# Patient Record
Sex: Female | Born: 1960 | Race: Black or African American | Hispanic: No | Marital: Married | Smoking: Never smoker
Health system: Southern US, Community
[De-identification: ages and names within clinical notes are randomized; demographics above are authoritative.]

## PROBLEM LIST (undated history)

## (undated) DIAGNOSIS — L509 Urticaria, unspecified: Secondary | ICD-10-CM

## (undated) DIAGNOSIS — R51 Headache: Secondary | ICD-10-CM

## (undated) DIAGNOSIS — J45909 Unspecified asthma, uncomplicated: Secondary | ICD-10-CM

## (undated) DIAGNOSIS — M1990 Unspecified osteoarthritis, unspecified site: Secondary | ICD-10-CM

## (undated) DIAGNOSIS — E119 Type 2 diabetes mellitus without complications: Secondary | ICD-10-CM

## (undated) DIAGNOSIS — D252 Subserosal leiomyoma of uterus: Secondary | ICD-10-CM

## (undated) DIAGNOSIS — R87619 Unspecified abnormal cytological findings in specimens from cervix uteri: Secondary | ICD-10-CM

## (undated) DIAGNOSIS — J309 Allergic rhinitis, unspecified: Secondary | ICD-10-CM

## (undated) DIAGNOSIS — I10 Essential (primary) hypertension: Secondary | ICD-10-CM

## (undated) DIAGNOSIS — K219 Gastro-esophageal reflux disease without esophagitis: Secondary | ICD-10-CM

## (undated) HISTORY — DX: Unspecified osteoarthritis, unspecified site: M19.90

## (undated) HISTORY — PX: TUBAL LIGATION: SHX77

## (undated) HISTORY — DX: Subserosal leiomyoma of uterus: D25.2

## (undated) HISTORY — DX: Unspecified abnormal cytological findings in specimens from cervix uteri: R87.619

## (undated) HISTORY — PX: COLONOSCOPY: SHX174

## (undated) HISTORY — DX: Urticaria, unspecified: L50.9

## (undated) HISTORY — DX: Essential (primary) hypertension: I10

## (undated) HISTORY — DX: Type 2 diabetes mellitus without complications: E11.9

## (undated) HISTORY — PX: BUNIONECTOMY: SHX129

## (undated) HISTORY — DX: Allergic rhinitis, unspecified: J30.9

---

## 1998-10-05 ENCOUNTER — Other Ambulatory Visit: Admission: RE | Admit: 1998-10-05 | Discharge: 1998-10-05 | Payer: Self-pay | Admitting: Obstetrics and Gynecology

## 2001-02-11 ENCOUNTER — Other Ambulatory Visit: Admission: RE | Admit: 2001-02-11 | Discharge: 2001-02-11 | Payer: Self-pay | Admitting: Obstetrics and Gynecology

## 2002-03-02 ENCOUNTER — Other Ambulatory Visit: Admission: RE | Admit: 2002-03-02 | Discharge: 2002-03-02 | Payer: Self-pay | Admitting: Obstetrics and Gynecology

## 2002-10-01 ENCOUNTER — Encounter: Payer: Self-pay | Admitting: Emergency Medicine

## 2002-10-01 ENCOUNTER — Emergency Department (HOSPITAL_COMMUNITY): Admission: EM | Admit: 2002-10-01 | Discharge: 2002-10-01 | Payer: Self-pay | Admitting: Emergency Medicine

## 2004-05-31 ENCOUNTER — Emergency Department (HOSPITAL_COMMUNITY): Admission: EM | Admit: 2004-05-31 | Discharge: 2004-05-31 | Payer: Self-pay

## 2004-09-19 ENCOUNTER — Other Ambulatory Visit: Admission: RE | Admit: 2004-09-19 | Discharge: 2004-09-19 | Payer: Self-pay | Admitting: Obstetrics and Gynecology

## 2006-03-07 ENCOUNTER — Encounter: Admission: RE | Admit: 2006-03-07 | Discharge: 2006-03-07 | Payer: Self-pay | Admitting: Family Medicine

## 2006-03-25 ENCOUNTER — Encounter: Admission: RE | Admit: 2006-03-25 | Discharge: 2006-06-23 | Payer: Self-pay | Admitting: Neurosurgery

## 2007-03-31 ENCOUNTER — Emergency Department (HOSPITAL_COMMUNITY): Admission: EM | Admit: 2007-03-31 | Discharge: 2007-04-01 | Payer: Self-pay | Admitting: Emergency Medicine

## 2007-06-07 ENCOUNTER — Encounter: Admission: RE | Admit: 2007-06-07 | Discharge: 2007-06-07 | Payer: Self-pay | Admitting: Gastroenterology

## 2007-10-05 ENCOUNTER — Ambulatory Visit: Payer: Self-pay | Admitting: Internal Medicine

## 2007-10-05 DIAGNOSIS — J3089 Other allergic rhinitis: Secondary | ICD-10-CM

## 2007-10-05 DIAGNOSIS — J302 Other seasonal allergic rhinitis: Secondary | ICD-10-CM

## 2007-10-05 DIAGNOSIS — J4520 Mild intermittent asthma, uncomplicated: Secondary | ICD-10-CM

## 2007-10-05 LAB — CONVERTED CEMR LAB
A-1 Antitrypsin, Ser: 171 mg/dL (ref 83–200)
IgE (Immunoglobulin E), Serum: 259.4 intl units/mL — ABNORMAL HIGH (ref 0.0–180.0)

## 2007-10-06 ENCOUNTER — Telehealth: Payer: Self-pay | Admitting: Internal Medicine

## 2007-11-10 ENCOUNTER — Telehealth: Payer: Self-pay | Admitting: Internal Medicine

## 2008-09-11 ENCOUNTER — Encounter: Admission: RE | Admit: 2008-09-11 | Discharge: 2008-09-11 | Payer: Self-pay | Admitting: Neurological Surgery

## 2009-03-12 ENCOUNTER — Emergency Department (HOSPITAL_COMMUNITY): Admission: EM | Admit: 2009-03-12 | Discharge: 2009-03-12 | Payer: Self-pay | Admitting: Emergency Medicine

## 2009-06-07 ENCOUNTER — Telehealth: Payer: Self-pay | Admitting: Internal Medicine

## 2009-07-27 ENCOUNTER — Ambulatory Visit: Payer: Self-pay | Admitting: Internal Medicine

## 2009-08-23 ENCOUNTER — Telehealth: Payer: Self-pay | Admitting: Internal Medicine

## 2009-08-23 ENCOUNTER — Ambulatory Visit: Payer: Self-pay | Admitting: Internal Medicine

## 2009-09-03 ENCOUNTER — Telehealth (INDEPENDENT_AMBULATORY_CARE_PROVIDER_SITE_OTHER): Payer: Self-pay | Admitting: *Deleted

## 2009-10-05 ENCOUNTER — Ambulatory Visit: Payer: Self-pay | Admitting: Internal Medicine

## 2009-10-05 DIAGNOSIS — R599 Enlarged lymph nodes, unspecified: Secondary | ICD-10-CM | POA: Insufficient documentation

## 2009-11-12 ENCOUNTER — Telehealth (INDEPENDENT_AMBULATORY_CARE_PROVIDER_SITE_OTHER): Payer: Self-pay | Admitting: *Deleted

## 2009-11-15 ENCOUNTER — Emergency Department (HOSPITAL_COMMUNITY): Admission: EM | Admit: 2009-11-15 | Discharge: 2009-11-15 | Payer: Self-pay | Admitting: Family Medicine

## 2009-12-03 ENCOUNTER — Telehealth (INDEPENDENT_AMBULATORY_CARE_PROVIDER_SITE_OTHER): Payer: Self-pay | Admitting: *Deleted

## 2009-12-05 ENCOUNTER — Telehealth: Payer: Self-pay | Admitting: Internal Medicine

## 2009-12-11 ENCOUNTER — Encounter: Payer: Self-pay | Admitting: Internal Medicine

## 2010-01-01 ENCOUNTER — Telehealth (INDEPENDENT_AMBULATORY_CARE_PROVIDER_SITE_OTHER): Payer: Self-pay | Admitting: *Deleted

## 2010-01-08 ENCOUNTER — Ambulatory Visit: Payer: Self-pay | Admitting: Internal Medicine

## 2010-01-08 DIAGNOSIS — B370 Candidal stomatitis: Secondary | ICD-10-CM

## 2010-07-30 NOTE — Miscellaneous (Signed)
Summary: Orders Update pft charges  Clinical Lists Changes  Orders: Added new Service order of Carbon Monoxide diffusing w/capacity (94720) - Signed Added new Service order of Lung Volumes (94240) - Signed Added new Service order of Spirometry (Pre & Post) (94060) - Signed 

## 2010-07-30 NOTE — Assessment & Plan Note (Signed)
Summary: full pft follow up/klw   Visit Type:  Follow-up Copy to:  Victoria [name] Primary Provider/Referring Provider:  Dr. Reather [name]  CC:  Pt here for follow-up to discuss PFT results. Pt states breahting is the same. Marland Kitchen  History of Present Illness: OV 07/27/2009. Followup Moderate Persistent Asthma (allergic asthma). I  initially saw Victoria [name] as a new difficultl asthma eval in April 2009. The striking thing at that time was that despite medications her FEv1s were always in 50-60% range. She was then lost to followup. SHe says she left Dr Victoria [name] care and has been under care of DR Victoria [name]. Dr Victoria [name] started her on xolair around Sept 2010 per her report. Despite xolair she says that subjextively she is the same which is "stable" and needing albuterol 2 times per week, no nocturenal symptoms but suffering from dyspnea with exertion to mailbox that is relieved by rest and inhalers. Objectively she states that spirometries at Victoria [name] office have not shown any improvement and is "Bad". I do not have those numbers with me for review. She is back here wondering how else we can optimize treatment.  Of note, at last visit in 2009 I had checked A1AT levels. this was normal 174 and IgE. The IGE was high in 250s. Also, clinically I did not think she had ABPA  REC:  change symbicort to high dose dulera return with pft's  OV 2/24/.2011: Followup Moderate Persistent Asthma (allergic asthma). Since starting dulera last month, she has noticed no difference in symptoms. STates she has exertional chest tighness that persists. She notices that with exertion like working out in elliptical. That is when she used albuterol last; 2 weeks ago. Last 2 weeks no dyspnea, no wheeze, no cough, no nocturnal awakeninings. She does not think dulear has helped. PFTs today show Fev1 1.62L/61% with DLCO 15.6/63% (note: in 2009 outside CT chest thought benign and a1at level normal). THis is a 300cc improvement in fev1 since starting  dulera.   Current Medications (verified): 1)  Singulair 10 Mg  Tabs (Montelukast Sodium) .... Once Daily 2)  Dulera 100-5 Mcg/act Aero (Mometasone Furo-Formoterol Fum) .... 2 Puffs Twice Daily 3)  Fluticasone Propionate 50 Mcg/act Susp (Fluticasone Propionate) .... 2 Puffs Once Daily 4)  C-250 .... Take 1 Tablet By Mouth Once A Day 5)  Multivitamins  Tabs (Multiple Vitamin) .... Take 1 Tablet By Mouth Once A Day 6)  Magnesium Oxide 420 Mg Tabs (Magnesium Oxide) .... As Needed 7)  Xolair 150 Mg Solr (Omalizumab) .... Every 2 Weeks, 2 Injections  Allergies (verified): 1)  ! Sulfa  Past History:  Family History: Last updated: 10/05/2007 emphysema--father asthma - son  Social History: Last updated: 10/05/2007 have children married lives with husband imaging analyst Patient has never smoked.   Risk Factors: Alcohol Use: <1 (10/05/2007)  Risk Factors: Smoking Status: never (10/05/2007)  Past Medical History: #Allergic Rhinitis  #Asthma x diagnosed at age 4. Denies hospitalization or ICU admits or mechanical admits. Total ER visits = 15. Last ER visit 03/2007 for "drug allergy" and 2001 for asthma attack due to shell fish. Typical symptoms are dyspnea, wheezing, lower back pain, cough. Typical triggers are sea food, shellfish, sulfa, dust, pollen, cigarettes, cold air etc.,  Was on inahled steroid flovent 2002 - > 2008. On advair for 1 year 2008. On singular since 2003 or so. On allergy shots once a week x since 03/2007.  #Positive Allergy Testing x 03/2007 - dust mite, ragweed, shellfish, "most trees", cat, dog,  cockroach, (reviewed and noted from outside records of Victoria [name]) #GERD x many years esp with spicy food. Symptomatic 1-2 times a week. Was on prilosec. Currently not on PPI  Past Pulmonary History:  Pulmonary History: OFFICE spiro at Dr. Blossom [name] office  09/30/2007: Fev1 - 1.7L/69%, ratio 61 (82) 09/01/2007: fev1- 1.69/68% 08/11/2007: fev1-  1.49/60% 08/09/2007: fev1  - 1.228/49% (worst) - recd prednisone per hx 06/29/2007: fev1 - 1.61/64% 11/19/208: fev1 - 1.95/78% (best) 04/13/2007 - fev1 - 1.274/51% (1st visit with Dr. Corinda [name], rec pred)  ..... Victoria [name] here 07/27/2009 - fev1 1.34L/53.7% -> change symbicort to high dose dulera 08/23/2009 - fev1 1.62L/61% (300cc improvoement)  Family History: Reviewed history from 10/05/2007 and no changes required. emphysema--father asthma - son  Social History: Reviewed history from 10/05/2007 and no changes required. have children married lives with husband imaging analyst Patient has never smoked.   Review of Systems  The patient denies shortness of breath with activity, shortness of breath at rest, productive cough, non-productive cough, coughing up blood, chest pain, irregular heartbeats, acid heartburn, indigestion, loss of appetite, weight change, abdominal pain, difficulty swallowing, sore throat, tooth/dental problems, headaches, nasal congestion/difficulty breathing through nose, sneezing, itching, ear ache, anxiety, depression, hand/feet swelling, joint stiffness or pain, rash, change in color of mucus, and fever.    Vital Signs:  Patient profile:   50 year old female Height:      65 inches Weight:      185 pounds O2 Sat:      99 % on Room air Pulse rate:   68 / minute BP sitting:   130 / 86  (right arm) Cuff size:   regular  Vitals Entered By: Victoria [name] CMA (August 23, 2009 1:50 PM)  O2 Flow:  Room air CC: Pt here for follow-up to discuss PFT results. Pt states breahting is the same.  Comments Medications reviewed with patient Victoria [name] CMA  August 23, 2009 1:52 PM Daytime phone number verified with patient.    Physical Exam  General:  well developed, well nourished, in no acute distress Head:  normocephalic and atraumatic Eyes:  PERRLA/EOM intact; conjunctiva and sclera clear Ears:  TMs intact and clear with normal canals Nose:  no deformity, discharge,  inflammation, or lesions Mouth:  no deformity or lesions Neck:  no masses, thyromegaly, or abnormal cervical nodes Chest Wall:  no deformities noted Lungs:  clear bilaterally to auscultation and percussion Heart:  regular rate and rhythm, S1, S2 without murmurs, rubs, gallops, or clicks Abdomen:  bowel sounds positive; abdomen soft and non-tender without masses, or organomegaly Msk:  no deformity or scoliosis noted with normal posture Pulses:  pulses normal Extremities:  no clubbing, cyanosis, edema, or deformity noted Neurologic:  CN II-XII grossly intact with normal reflexes, coordination, muscle strength and tone Skin:  intact without lesions or rashes Cervical Nodes:  no significant adenopathy Psych:  alert and cooperative; normal mood and affect; normal attention span and concentration   Impression & Recommendations:  Problem # 1:  ASTHMA, PERSISTENT, MODERATE (ICD-493.90) Assessment Improved subjectively remains in  well controlled category with fixed exertional dyspna. . Objectively improved by 300cc fev1 after starting dulera but feels no better. Suspect she is remodelled. Will start spiriva (new NEJM study) and see if this helps. She really wants to see improvement in fev1. return in 6 weeks. If spiriva does not work, can add ciclesonide to target small airways. If that too does not work, then ? thermoplasty eval.   NOTE: DLCO  63% but she never smoked and outside CT showed no fibrosis or copd. Will try to review this again at next visit and also check her hgb at next visit  Medications Added to Medication List This Visit: 1)  Dulera 100-5 Mcg/act Aero (Mometasone furo-formoterol fum) .... 2 puffs twice daily 2)  Spiriva Handihaler 18 Mcg Caps (Tiotropium bromide monohydrate) .... One capsule - take 2 puffs in handihaler daily in morning  Other Orders: HFA Instruction [phone]) Est. Patient Level III (60454)  Patient Instructions: 1)  continue singulair 2)  continue xolair 3)   cointinue high dose dulera 2puff two times a day (take 2 samples) 4)  start spiriva 1 puff  one time a day (take 2 samples, learn tech) 5)  continue albuterol 2 puff as needed 6)  return to see me in 6 weeks  7)  do spirometry at return Prescriptions: SPIRIVA HANDIHALER 18 MCG  CAPS (TIOTROPIUM BROMIDE MONOHYDRATE) one capsule - take 2 puffs in handihaler daily in morning  #1 x 6   Entered and Authorized by:   Kalman Shan MD   Signed by:   Kalman Shan MD on 08/23/2009   Method used:   Electronically to        [facility] [id number] (retail)       [address]       [place], [place]  [address]       Ph: [phone]       Fax: [phone]   RxID:   [id number]

## 2010-07-30 NOTE — Assessment & Plan Note (Signed)
Summary: rov/ mbw   Visit Type:  Follow-up Copy to:  Stevphen Rochester Primary Provider/Referring Provider:  Dr. Reather Converse  CC:  Pt here for follow-up. Pt states allergist told her to schedule an appt because she felt her breahting was not as good as it should be. Pt has no complaints. .  History of Present Illness: OV 07/27/2009. Followup Asthma (allergic asthma). I  initially saw Victoria [name] as a new difficultl asthma eval in April 2009. The striking thing at that time was that despite medications her FEv1s were always in 50-60% range. She was then lost to followup. SHe says she left Dr Blossom Hoops care and has been under care of DR Willa Rough. Dr Willa Rough started her on xolair around Sept 2010 per her report. Despite xolair she says that subjextively she is the same which is "stable" and needing albuterol 2 times per week, no nocturenal symptoms but suffering from dyspnea with exertion to mailbox that is relieved by rest and inhalers. Objectively she states that spirometries at DR. Hicks office have not shown any improvement and is "Bad". I do not have those numbers with me for review. She is back here wondering how else we can optimize treatment.  Of note, at last visit I had checked A1AT levels. this was normal 174 and IgE. The IGE was high in 250s. Also, clinically I did not think she had ABPA  [facility] Spirometry  ID: [id number] Patient: Victoria [name], Victoria [name] DOB: 02-23-1961 Age: 50 Years Old Sex: Female Race: Black Height: 65 Weight: 186 Status: Unconfirmed Past Medical History:  Allergic Rhinitis  Asthma x diagnosed at age 35. Denies hospitalization or ICU admits or mechanical admits. Total ER visits = 15. Last ER visit 03/2007 for "drug allergy" and 2001 for asthma attack due to shell fish. Typical symptoms are dyspnea, wheezing, lower back pain, cough. Typical triggers are sea food, shellfish, sulfa, dust, pollen, cigarettes, cold air etc.,  Was on inahled steroid flovent 2002 - > 2008. On advair  for 1 year 2008. On singular since 2003 or so. On allergy shots once a week x since 03/2007.  Positive Allergy Testing x 03/2007 - dust mite, ragweed, shellfish, "most trees", cat, dog, cockroach, (reviewed and noted from outside records of Dr. Delano Metz) GERD x many years esp with spicy food. Symptomatic 1-2 times a week. Was on prilosec. Currently not on PPI Allergy Skin testing - 03/2007 - positive for roaches, dust, shell fish  Recorded: 07/27/2009 12:14 AM  Parameter  Measured Predicted %Predicted FVC     2.12        3.09        68.80 FEV1     1.34        2.50        53.70 FEV1%   63.22        82.28        76.80 PEF    2.65        6.53        40.60   Interpretation: moderate - severe obstruction, Personally reviewed trace  Current Medications (verified): 1)  Singulair 10 Mg  Tabs (Montelukast Sodium) .... Once Daily 2)  Symbicort 160-4.5 Mcg/act  Aero (Budesonide-Formoterol Fumarate) .... Two Times A Day 3)  Fluticasone Propionate 50 Mcg/act Susp (Fluticasone Propionate) .... 2 Puffs Once Daily 4)  C-250 .... Take 1 Tablet By Mouth Once A Day 5)  Multivitamins  Tabs (Multiple Vitamin) .... Take 1 Tablet By Mouth Once A Day 6)  Magnesium Oxide  420 Mg Tabs (Magnesium Oxide) .... As Needed 7)  Xolair 150 Mg Solr (Omalizumab) .... Every 2 Weeks, 2 Injections  Allergies (verified): 1)  ! Sulfa  Past History:  Past Medical History: Last updated: 10/05/2007 Allergic Rhinitis  Asthma x diagnosed at age 65. Denies hospitalization or ICU admits or mechanical admits. Total ER visits = 15. Last ER visit 03/2007 for "drug allergy" and 2001 for asthma attack due to shell fish. Typical symptoms are dyspnea, wheezing, lower back pain, cough. Typical triggers are sea food, shellfish, sulfa, dust, pollen, cigarettes, cold air etc.,  Was on inahled steroid flovent 2002 - > 2008. On advair for 1 year 2008. On singular since 2003 or so. On allergy shots once a week x since 03/2007.  Positive  Allergy Testing x 03/2007 - dust mite, ragweed, shellfish, "most trees", cat, dog, cockroach, (reviewed and noted from outside records of Dr. Delano Metz) GERD x many years esp with spicy food. Symptomatic 1-2 times a week. Was on prilosec. Currently not on PPI Allergy Skin testing - 03/2007 - positive for roaches, dust, shell fish  Family History: Last updated: 10/05/2007 emphysema--father asthma - son  Social History: Last updated: 10/05/2007 have children married lives with husband imaging analyst Patient has never smoked.   Risk Factors: Alcohol Use: <1 (10/05/2007)  Risk Factors: Smoking Status: never (10/05/2007)  Family History: Reviewed history from 10/05/2007 and no changes required. emphysema--father asthma - son  Social History: Reviewed history from 10/05/2007 and no changes required. have children married lives with husband imaging analyst Patient has never smoked.   Review of Systems  The patient denies shortness of breath with activity, shortness of breath at rest, productive cough, non-productive cough, coughing up blood, chest pain, irregular heartbeats, acid heartburn, indigestion, loss of appetite, weight change, abdominal pain, difficulty swallowing, sore throat, tooth/dental problems, headaches, nasal congestion/difficulty breathing through nose, sneezing, itching, ear ache, anxiety, depression, hand/feet swelling, joint stiffness or pain, rash, change in color of mucus, and fever.    Vital Signs:  Patient profile:   50 year old female Height:      65 inches Weight:      186 pounds BMI:     31.06 O2 Sat:      98 % on Room air Temp:     98.0 degrees F Pulse rate:   52 / minute BP sitting:   134 / 80  (right arm) Cuff size:   regular  Vitals Entered By: Carron Curie CMA (July 27, 2009 11:40 AM)  O2 Flow:  Room air CC: Pt here for follow-up. Pt states allergist told her to schedule an appt because she felt her breahting was not as good as  it should be. Pt has no complaints.  Comments Medications reviewed with patient Daytime phone number verified with patient. Carron Curie CMA  July 27, 2009 11:40 AM    Physical Exam  General:  well developed, well nourished, in no acute distress Head:  normocephalic and atraumatic Eyes:  PERRLA/EOM intact; conjunctiva and sclera clear Ears:  TMs intact and clear with normal canals Nose:  no deformity, discharge, inflammation, or lesions Mouth:  no deformity or lesions Neck:  no masses, thyromegaly, or abnormal cervical nodes Chest Wall:  no deformities noted Lungs:  clear bilaterally to auscultation and percussion Heart:  regular rate and rhythm, S1, S2 without murmurs, rubs, gallops, or clicks Abdomen:  bowel sounds positive; abdomen soft and non-tender without masses, or organomegaly Msk:  no deformity or scoliosis noted with  normal posture Pulses:  pulses normal Extremities:  no clubbing, cyanosis, edema, or deformity noted Neurologic:  CN II-XII grossly intact with normal reflexes, coordination, muscle strength and tone Skin:  intact without lesions or rashes Cervical Nodes:  no significant adenopathy Psych:  alert and cooperative; normal mood and affect; normal attention span and concentration   Pulmonary Function Test Date: 07/27/2009 12:14 AM Gender: Female  Pre-Spirometry FVC    Value: 2.12 L/min   % Pred: 68.80 % FEV1    Value: 1.34 L     Pred: 2.50 L     % Pred: 53.70 % FEV1/FVC  Value: 63.22 %     % Pred: 76.80 %  Evaluation: moderate obstruction with NO significant bronchodilator response  Impression & Recommendations:  Problem # 1:  ASTHMA, PERSISTENT, MODERATE (ICD-493.90) Assessment Unchanged Subjectively she appears poorly to well controlled NIH class but objectively she in the moderate-severe persistent class despite symbicort, singulair and xolair. I am wondering if she has remodelled asthma. Nevertheless, will siwtch her symbicort which is at  medium dose steroid to high dose DULERA (samples given) and see if this helps. IF this does not, I will try highes dose advair.  She will return to see me in 4 weeks. She is agreeable with plan  Medications Added to Medication List This Visit: 1)  Fluticasone Propionate 50 Mcg/act Susp (Fluticasone propionate) .... 2 puffs once daily 2)  C-250  .... Take 1 tablet by mouth once a day 3)  Multivitamins Tabs (Multiple vitamin) .... Take 1 tablet by mouth once a day 4)  Magnesium Oxide 420 Mg Tabs (Magnesium oxide) .... As needed 5)  Xolair 150 Mg Solr (Omalizumab) .... Every 2 weeks, 2 injections  Other Orders: Pulmonary Referral (Pulmonary) Est. Patient Level III (64403)  Patient Instructions: 1)  Your breathing test is at 50% 2)  Stop symbicort 3)  Take dulera high dose 2 puff two times a day 4)  Learn technique from my assistant 5)  Take 2 samples with you 6)  Return in 4 weeks 7)  Full pft at next visit   Immunization History:  Influenza Immunization History:    Influenza:  fluvax 3+ (03/30/2009)

## 2010-07-30 NOTE — Progress Notes (Signed)
Summary: samples  Phone Note Call from Patient Call back at Home Phone [phone]   Caller: Patient Call For: ramaswamy Reason for Call: Talk to Nurse Summary of Call: Patient requesting samples of spiriva. Initial call taken by: Lehman Prom,  Nov 12, 2009 12:13 PM  Follow-up for Phone Call        LM for pt that samples were left at front desk. Abigail Miyamoto RN  Nov 12, 2009 1:10 PM

## 2010-07-30 NOTE — Assessment & Plan Note (Signed)
Summary: rov w/ spirometry///JJ   Visit Type:  Follow-up Copy to:  [name] Primary Provider/Referring Provider:  Dr. Reather [name]  CC:  follow up today--with spirometry.  History of Present Illness: OV 07/27/2009. Followup Moderate Persistent Asthma (allergic asthma). I  initially saw [name] as [name] new difficultl asthma eval in April 2009. The striking thing at that time was that despite medications her FEv1s were always in 50-60% range. She was then lost to followup. SHe says she left [name] care and has been under care of [name]. [name] started her on xolair around Sept 2010 per her report. Despite xolair she says that subjextively she is the same which is "stable" and needing albuterol 2 times per week, no nocturenal symptoms but suffering from dyspnea with exertion to mailbox that is relieved by rest and inhalers. Objectively she states that spirometries at [name] office have not shown any improvement and is "Bad". I do not have those numbers with me for review. She is back here wondering how else we can optimize treatment.  Of note, at last visit in 2009 I had checked A1AT levels. this was normal 174 and IgE. The IGE was high in 250s. Also, clinically I did not think she had ABPA  REC:  change symbicort to high dose dulera return with pft's  OV 2/24/.2011: Followup Moderate Persistent Asthma (allergic asthma). Since starting dulera last month, she has noticed no difference in symptoms. STates she has exertional chest tighness that persists. She notices that with exertion like working out in elliptical. That is when she used albuterol last; 2 weeks ago. Last 2 weeks no dyspnea, no wheeze, no cough, no nocturnal awakeninings. She does not think dulear has helped. PFTs today show Fev1 1.62L/61% with DLCO 15.6/63% (note: in 2009 outside CT chest thought benign and a1at level normal). THis is [name] 300cc improvement in fev1 since starting dulera.   PLAN ADD spirivaOV 10/05/2009: Followupd  Moderate PErsistent Allergic ASthma. OVerall stable. No complaints. Using rescue rarely. Feels teh same. Last visit added spiriva. Feels it is not helping. Feels Dulera is causing weight gain. However, on persistent questioning admitted to 55-60% compliance with all asthma meds. She is also c/p some ticklish sensation in right neck that is present for few weeks. Mild in nature. No radiation. No worsening or relieivng factors. Random onset and course. PLAN: Improve compliance. Continue dulera, spiriva and singulair and xolair  OV 01/08/2010: Followup  Moderate PErsistent Allergic ASthma.  Since last visit has done well overall. Overal asthma is the same. She has improed her compliance to 100%. She has decided against continued xolair use and dc'ed it feew months ago. The summer heat has made her use pro-air 2 times this past month. Otherwise no problem. Only issue is new sore throat and mild hoasre voice x 1 day today. Denies fever, chills, wheezing. Fev1 today is is .36L/55% and unchnaged from baseline.   Preventive Screening-Counseling & Management  Alcohol-Tobacco     Smoking Status: never  Allergies: 1)  ! Sulfa  Comments:  Nurse/Medical Assistant: The patient's medications and allergies were reviewed with the patient and were updated in the Medication and Allergy Lists.  Past Pulmonary History:  Pulmonary History: OFFICE spiro at [name]  09/30/2007: Fev1 - 1.7L/69%, ratio 61 (82) 09/01/2007: fev1- 1.69/68% 08/11/2007: fev1-  1.49/60% 08/09/2007: fev1 - 1.228/49% (worst) - recd prednisone per hx 06/29/2007: fev1 - 1.61/64% 11/19/208: fev1 - 1.95/78% (best) 04/13/2007 - fev1 - 1.274/51% (1st visit with  [name], rec pred)  ..... Spiro here 07/27/2009 - fev1 1.34L/53.7% -> change symbicort to high dose dulera 08/23/2009 - fev1 1.62L/61% (300cc improvoement). Add spiriva 01/08/2010 - Fev1 1.36L/55% (not on xolair, but on spiria dulera, singulair and nasal steroid)  Family  History: Reviewed history from 10/05/2007 and no changes required. emphysema--father asthma - son  Social History: Reviewed history from 10/05/2007 and no changes required. have children married lives with husband imaging analyst Patient has never smoked.   Review of Systems  The patient denies shortness of breath with activity, shortness of breath at rest, productive cough, non-productive cough, coughing up blood, chest pain, irregular heartbeats, acid heartburn, indigestion, loss of appetite, weight change, abdominal pain, difficulty swallowing, sore throat, tooth/dental problems, headaches, nasal congestion/difficulty breathing through nose, sneezing, itching, ear ache, anxiety, depression, hand/feet swelling, joint stiffness or pain, rash, change in color of mucus, and fever.    Vital Signs:  Patient profile:   50 year old female Height:      65 inches Weight:      182.13 pounds BMI:     30.42 O2 Sat:      98 % on Room air Temp:     98.8 degrees F oral Pulse rate:   55 / minute BP sitting:   122 / 84  (right arm) Cuff size:   regular  Vitals Entered By: Randell Loop CMA (January 08, 2010 2:40 PM)  O2 Sat at Rest %:  98 O2 Flow:  Room air CC: follow up today--with spirometry Is Patient Diabetic? No Pain Assessment Patient in pain? no      Comments meds and allergies reviewed with pt today Randell Loop CMA  January 08, 2010 2:42 PM    Physical Exam  General:  well developed, well nourished, in no acute distress Head:  normocephalic and atraumatic Eyes:  PERRLA/EOM intact; conjunctiva and sclera clear Ears:  TMs intact and clear with normal canals Nose:  no deformity, discharge, inflammation, or lesions Mouth:  no deformity or lesions mild pharyngeal thrush +  Neck:  ? small submand node <1cm Chest Wall:  no deformities noted Lungs:  clear bilaterally to auscultation and percussion Heart:  regular rate and rhythm, S1, S2 without murmurs, rubs, gallops, or  clicks Abdomen:  bowel sounds positive; abdomen soft and non-tender without masses, or organomegaly Msk:  no deformity or scoliosis noted with normal posture Pulses:  pulses normal Extremities:  no clubbing, cyanosis, edema, or deformity noted Neurologic:  CN II-XII grossly intact with normal reflexes, coordination, muscle strength and tone Skin:  intact without lesions or rashes Cervical Nodes:  no significant adenopathy Psych:  alert and cooperative; normal mood and affect; normal attention span and concentration   Pulmonary Function Test Date: 01/08/2010 2:46 PM Gender: Female  Pre-Spirometry FVC    Value: 2.38 L/min   % Pred: 77.50 % FEV1    Value: 1.36 L     Pred: 2.48 L     % Pred: 54.80 % FEV1/FVC  Value: 57.15 %     % Pred: 69.60 %  Evaluation: moderate obstruction with NO significant bronchodilator response  Impression & Recommendations:  Problem # 1:  ENLARGEMENT OF LYMPH NODES (ICD-785.6) Assessment Comment Only  samll rt submand node  plan in feb 2011 asked her to talk to PMD about it  Problem # 2:  ASTHMA, PERSISTENT, MODERATE (ICD-493.90) Assessment: Unchanged Unchanged despite maximal inhaler Rx. Will stop spiriva. Just continue high dose dulera, singulair and prn nasal steroids. She is  not interested in [place]. She states she is compliant with inhaler Rx. I explained that she is potentially remodelled. Explained options of accepting her status quo verus thermoplasty. She seems to meet indications for thermoplasty. She will have to go to [place] for it. Given her some product info. She is not too keen. She is thinking about it. Will see her in 3 months with spiro  Problem # 3:  CANDIDIASIS, ORAL (ICD-112.0) Assessment: New  Mild oral thrush. Probably making her voice hoarse. Have given her script for nystatin swish and swallow.If unimproved, then will change dulera to either advair or high dose symbicort  Orders: Est. Patient Level IV (45409)  Medications  Added to Medication List This Visit: 1)  Nystatin 100000 Unit/ml Susp (Nystatin) .... Take one teaspoonful by mouth swish & swallow four times [name] day x 5 days  Patient Instructions: 1)  stop your spiriva 2)  take nystatin swish and swallow as directed 3)  use listerine mouth wash after every inhaler use of dulera 4)  take 3 samples of the high dose dulera 5)  return to see me in 3 months 6)  will do spirometry at [facility] 7)  if you get worse in between call us or come sooner 8)  you have remodelled asthma - think about thermoplasty eval Prescriptions: DULERA 200-5 MCG/ACT AERO (MOMETASONE FURO-FORMOTEROL FUM) 2 puffs twice daily  #1 x 1   Entered and Authorized by:   Kalman Shan MD   Signed by:   Kalman Shan MD on 01/08/2010   Method used:   Electronically to        [facility] [id number] (retail)       [address] [place], [place]  [address]       Ph: [phone]       Fax: [phone]   RxID:   [id number] NYSTATIN 100000 UNIT/ML  SUSP (NYSTATIN) Take one teaspoonful by mouth swish & swallow four times [facility] [name] day x 5 days  #126ml x 0   Entered and Authorized by:   Kalman Shan MD   Signed by:   Kalman Shan MD on 01/08/2010   Method used:   Electronically to        [facility] [id number] (retail)       [address]       [place], [place]  [address]       Ph: [phone]       Fax: [phone]   RxID:   [id number]      [employer] Spirometry  ID: [id number] Patient: Victoria Victoria [name] DOB: 03/02/1961 Age: 50 Years Old Sex: Female Race: Black Physician: Valentine Kuechle, Height: 65 Weight: 182.13 Status: Unconfirmed Past Medical History:  #Allergic Rhinitis  #Asthma x diagnosed at age 19. Denies hospitalization or ICU admits or mechanical admits. Total ER visits = 15. Last ER visit 03/2007 for "drug allergy" and 2001 for asthma attack due to shell fish. Typical symptoms are dyspnea,  wheezing, lower back pain, cough. Typical triggers are sea food, shellfish, sulfa, dust, pollen, cigarettes, cold air etc.,  Was on inahled steroid flovent 2002 - > 2008. On advair for 1 year 2008. On singular since 2003 or so. On allergy shots once [name] week x since 03/2007.  #Positive Allergy Testing x 03/2007 - dust mite, ragweed, shellfish, "most trees", cat, dog, cockroach, (reviewed and  noted from outside records of [facility]) #GERD x many years esp with spicy food. Symptomatic 1-2 times [name] week. Was on prilosec. Currently not on PPI   Recorded: 01/08/2010 2:46 PM  Parameter  Measured Predicted %Predicted FVC     2.38        3.07        77.50 FEV1     1.36        2.48        54.80 FEV1%   57.15        82.07        69.60 PEF    2.70        6.49        41.60   Interpretation:

## 2010-07-30 NOTE — Progress Notes (Signed)
Summary: SAMPLES  Phone Note Call from Patient Call back at [phone]   Caller: Patient Call For: [facility] Summary of Call: SAMPLES OF [facility] Initial call taken by: Rickard Patience,  December 03, 2009 3:22 PM  Follow-up for Phone Call        2 boxes spiriva left up front for pick up.  LMOM for pt to be aware. Follow-up by: Vernie Murders,  December 03, 2009 3:39 PM

## 2010-07-30 NOTE — Progress Notes (Signed)
Summary: supplements  Phone Note Call from Patient Call back at [facility] Phone [phone]   Caller: Patient Call For: Cesar Rogerson Reason for Call: Talk to Nurse Summary of Call: L-Tyrosine and Bioastin - 2 more supplements pt takes Initial call taken by: Eugene Gavia,  August 23, 2009 4:10 PM  Follow-up for Phone Call        FYI for jennifer Randell Loop [facility]  August 23, 2009 4:11 PM   meds entered on med list. Carron Curie CMA  August 30, 2009 9:23 AM     New/Updated Medications: L-TYROSINE 500 MG CAPS (TYROSINE) Take 1 tablet by mouth once a day * BIOASTIN Take 1 tablet by mouth once a day

## 2010-07-30 NOTE — Progress Notes (Signed)
Summary: REACTION TO MED  Phone Note Call from Patient   Caller: Patient Call For: RAMASWAMY Summary of Call: PT HAVING PROBLEM SLEEPING THINK IT MIGHT BE REACTION FROM [facility] Initial call taken by: Rickard Patience,  September 03, 2009 12:00 PM  Follow-up for Phone Call        Pt c/o trouble sleeping since starting spiriva. Could this be a side effect of spiriva? Pelase advise. Carron Curie CMA  September 03, 2009 12:14 PM unlikely but ok to try off for a week and see Follow-up by: Nyoka Cowden MD,  September 03, 2009 1:12 PM  Additional Follow-up for Phone Call Additional follow up Details #1::        Pt informed of Dr Thurston Hole instructions and told to call our office if any questions. Abigail Miyamoto RN  September 03, 2009 1:18 PM

## 2010-07-30 NOTE — Progress Notes (Signed)
Summary: needs appt w/ mr - LMTCB x2  Phone Note Call from Patient   Caller: Patient Call For: ramaswamy Summary of Call: pt called to rsc her appt w/ mr- rov w/ spirometry. next avail w/ mr is 7/27. pt says that mr wanted her to be seen before then. (pt NOS on 6/7 but says that she cancelled it. either way, says she needs to f/u soon w/ mr. (also needs late in the day appt) [phone] Initial call taken by: Tivis Ringer, CNA,  January 01, 2010 4:55 PM  Follow-up for Phone Call        MR, would you like patient worked into your schedule one day (and which day) or to have pt follow up w/ TP?   Boone Master CNA/MA  January 01, 2010 5:01 PM   Additional Follow-up for Phone Call Additional follow up Details #1::        next week we are opening up some morning slots. Please check with Philipp Deputy on 01/02/2010 and I can see her next week with spirometry Additional Follow-up by: Kalman Shan MD,  January 01, 2010 5:51 PM    Additional Follow-up for Phone Call Additional follow up Details #2::    an opening came available on MR's sched 7.12.11 @310 .  appt scheduled so that the spot will not be used.  LMOM TCB to ask pt if this date and time will work for her - otherwise, there are no other available appts. Boone Master CNA/MA  January 02, 2010 2:41 PM   [facility] Gweneth Dimitri RN  January 03, 2010 4:21 PM  [facility] Vernie Murders  January 04, 2010 2:54 PM   called and spoke with pt.  pt aware of appt date and time and is ok with this.  Arman Filter LPN  January 07, 2010 9:55 AM

## 2010-07-30 NOTE — Assessment & Plan Note (Signed)
Summary: 6 weeks/apc   Visit Type:  Follow-up Copy to:  Stevphen [facility] Primary Provider/Referring Provider:  Dr. Reather Converse  CC:  Pt here for follow-up.  Pt denies any new problems. .  History of Present Illness: OV 07/27/2009. Followup Moderate Persistent Asthma (allergic asthma). I  initially saw Victoria [name] as a new difficultl asthma eval in April 2009. The striking thing at that time was that despite medications her FEv1s were always in 50-60% range. She was then lost to followup. SHe says she left Dr Blossom Hoops care and has been under care of DR Willa Rough. Dr Willa Rough started her on xolair around Sept 2010 per her report. Despite xolair she says that subjextively she is the same which is "stable" and needing albuterol 2 times per week, no nocturenal symptoms but suffering from dyspnea with exertion to mailbox that is relieved by rest and inhalers. Objectively she states that spirometries at DR. Hicks office have not shown any improvement and is "Bad". I do not have those numbers with me for review. She is back here wondering how else we can optimize treatment.  Of note, at last visit in 2009 I had checked A1AT levels. this was normal 174 and IgE. The IGE was high in 250s. Also, clinically I did not think she had ABPA  REC:  change symbicort to high dose dulera return with pft's  OV 2/24/.2011: Followup Moderate Persistent Asthma (allergic asthma). Since starting dulera last month, she has noticed no difference in symptoms. STates she has exertional chest tighness that persists. She notices that with exertion like working out in elliptical. That is when she used albuterol last; 2 weeks ago. Last 2 weeks no dyspnea, no wheeze, no cough, no nocturnal awakeninings. She does not think dulear has helped. PFTs today show Fev1 1.62L/61% with DLCO 15.6/63% (note: in 2009 outside CT chest thought benign and a1at level normal). THis is a 300cc improvement in fev1 since starting dulera.   PLAN ADD spirivaOV 10/05/2009:  Followupd Moderate PErsistent Allergic ASthma. OVerall stable. No complaints. Using rescue rarely. Feels teh same. Last visit added spiriva. Feels it is not helping. Feels Dulera is causing weight gain. However, on persistent questioning admitted to 55-60% compliance with all asthma meds. She is also c/p some ticklish sensation in right neck that is present for few weeks. Mild in nature. No radiation. No worsening or relieivng factors. Random onset and course.  CardioPerfect Spirometry  ID: [id number] Patient: Victoria [name], Victoria [name] DOB: 1960-12-12 Age: 50 Years Old Sex: Female Race: Black Height: 65 Weight: 186.50 Status: Unconfirmed Past Medical History:  #Allergic Rhinitis  #Asthma x diagnosed at age 38. Denies hospitalization or ICU admits or mechanical admits. Total ER visits = 15. Last ER visit 03/2007 for "drug allergy" and 2001 for asthma attack due to shell fish. Typical symptoms are dyspnea, wheezing, lower back pain, cough. Typical triggers are sea food, shellfish, sulfa, dust, pollen, cigarettes, cold air etc.,  Was on inahled steroid flovent 2002 - > 2008. On advair for 1 year 2008. On singular since 2003 or so. On allergy shots once a week x since 03/2007.  #Positive Allergy Testing x 03/2007 - dust mite, ragweed, shellfish, "most trees", cat, dog, cockroach, (reviewed and noted from outside records of Dr. Delano Metz) #GERD x many years esp with spicy food. Symptomatic 1-2 times a week. Was on prilosec. Currently not on PPI   Recorded: 10/05/2009 09:58 AM  Parameter  Measured Predicted %Predicted FVC     2.29  3.07        74.80 FEV1     1.41        2.48        56.90 FEV1%   61.45        82.07        74.90 PEF    2.86        6.49        44.10   Interpretation: moderate obistruction  Current Medications (verified): 1)  Singulair 10 Mg  Tabs (Montelukast Sodium) .... Once Daily 2)  Dulera 200-5 Mcg/act Aero (Mometasone Furo-Formoterol Fum) .... 2 Puffs Twice Daily 3)   Fluticasone Propionate 50 Mcg/act Susp (Fluticasone Propionate) .... 2 Puffs Once Daily 4)  C-250 .... Take 1 Tablet By Mouth Once A Day 5)  Multivitamins  Tabs (Multiple Vitamin) .... Take 1 Tablet By Mouth Once A Day 6)  Magnesium Oxide 420 Mg Tabs (Magnesium Oxide) .... As Needed 7)  Xolair 150 Mg Solr (Omalizumab) .... Every 2 Weeks, 2 Injections 8)  L-Tyrosine 500 Mg Caps (Tyrosine) .... Take 1 Tablet By Mouth Once A Day 9)  Bioastin .... Take 1 Tablet By Mouth Once A Day  Allergies (verified): 1)  ! Sulfa  Past History:  Family History: Last updated: 10/05/2007 emphysema--father asthma - son  Social History: Last updated: 10/05/2007 have children married lives with husband imaging analyst Patient has never smoked.   Risk Factors: Alcohol Use: <1 (10/05/2007)  Risk Factors: Smoking Status: never (10/05/2007)  Past Medical History: Reviewed history from 08/23/2009 and no changes required. #Allergic Rhinitis  #Asthma x diagnosed at age 21. Denies hospitalization or ICU admits or mechanical admits. Total ER visits = 15. Last ER visit 03/2007 for "drug allergy" and 2001 for asthma attack due to shell fish. Typical symptoms are dyspnea, wheezing, lower back pain, cough. Typical triggers are sea food, shellfish, sulfa, dust, pollen, cigarettes, cold air etc.,  Was on inahled steroid flovent 2002 - > 2008. On advair for 1 year 2008. On singular since 2003 or so. On allergy shots once a week x since 03/2007.  #Positive Allergy Testing x 03/2007 - dust mite, ragweed, shellfish, "most trees", cat, dog, cockroach, (reviewed and noted from outside records of Dr. Delano Metz) #GERD x many years esp with spicy food. Symptomatic 1-2 times a week. Was on prilosec. Currently not on PPI  Past Pulmonary History:  Pulmonary History: OFFICE spiro at Dr. Blossom Hoops office  09/30/2007: Fev1 - 1.7L/69%, ratio 61 (82) 09/01/2007: fev1- 1.69/68% 08/11/2007: fev1-  1.49/60% 08/09/2007: fev1 - 1.228/49%  (worst) - recd prednisone per hx 06/29/2007: fev1 - 1.61/64% 11/19/208: fev1 - 1.95/78% (best) 04/13/2007 - fev1 - 1.274/51% (1st visit with Dr. Corinda Gubler, rec pred)  ..... Cleda Daub here 07/27/2009 - fev1 1.34L/53.7% -> change symbicort to high dose dulera 08/23/2009 - fev1 1.62L/61% (300cc improvoement)  Family History: Reviewed history from 10/05/2007 and no changes required. emphysema--father asthma - son  Social History: Reviewed history from 10/05/2007 and no changes required. have children married lives with husband imaging analyst Patient has never smoked.   Review of Systems      See HPI  The patient denies anorexia, fever, weight loss, weight gain, vision loss, decreased hearing, hoarseness, chest pain, syncope, dyspnea on exertion, peripheral edema, prolonged cough, headaches, hemoptysis, abdominal pain, melena, hematochezia, severe indigestion/heartburn, hematuria, incontinence, genital sores, muscle weakness, suspicious skin lesions, transient blindness, difficulty walking, depression, unusual weight change, abnormal bleeding, enlarged lymph nodes, angioedema, breast masses, and testicular masses.  Vital Signs:  Patient profile:   50 year old female Height:      65 inches Weight:      186.50 pounds O2 Sat:      97 % on Room air Temp:     97.8 degrees F oral Pulse rate:   75 / minute BP sitting:   130 / 98  (right arm) Cuff size:   regular  Vitals Entered By: Carron Curie CMA (October 05, 2009 9:55 AM)  O2 Flow:  Room air CC: Pt here for follow-up.  Pt denies any new problems.  Comments Medications reviewed with patient Carron Curie CMA  October 05, 2009 9:57 AM Daytime phone number verified with patient.    Physical Exam  General:  well developed, well nourished, in no acute distress Head:  normocephalic and atraumatic Eyes:  PERRLA/EOM intact; conjunctiva and sclera clear Ears:  TMs intact and clear with normal canals Nose:  no deformity, discharge,  inflammation, or lesions Mouth:  no deformity or lesions no thrush Neck:  ? small submand node <1cm Chest Wall:  no deformities noted Lungs:  clear bilaterally to auscultation and percussion Heart:  regular rate and rhythm, S1, S2 without murmurs, rubs, gallops, or clicks Abdomen:  bowel sounds positive; abdomen soft and non-tender without masses, or organomegaly Msk:  no deformity or scoliosis noted with normal posture Pulses:  pulses normal Extremities:  no clubbing, cyanosis, edema, or deformity noted Neurologic:  CN II-XII grossly intact with normal reflexes, coordination, muscle strength and tone Skin:  intact without lesions or rashes Cervical Nodes:  no significant adenopathy Psych:  alert and cooperative; normal mood and affect; normal attention span and concentration   Pulmonary Function Test Date: 10/05/2009 09:58 AM Gender: Female  Pre-Spirometry FVC    Value: 2.29 L/min   % Pred: 74.80 % FEV1    Value: 1.41 L     Pred: 2.48 L     % Pred: 56.90 % FEV1/FVC  Value: 61.45 %     % Pred: 74.90 %  Comments: febv1 worse by 210cc since lst viist  Evaluation: moderate obstruction with NO significant bronchodilator response  Impression & Recommendations:  Problem # 1:  ASTHMA, PERSISTENT, MODERATE (ICD-493.90) Assessment Deteriorated Subjectively same. Objectively worse. Noncomplance or poor compliance is the issue.   plan educated on need ofr compliance explained if she cannot be compliant, remodeling is possibility. OFfered referred to Dr. Sheffield Slider in [place] for thermoplasty eval to help her improve lung function or be less dependeng on inhalers/asthma meds. BUt, she does not want to participate in that process right now due to job swtich she has agreed to be compliant continue singulair, xolair, spiriva and dulera rov 3 months with spirometyr  Problem # 2:  ENLARGEMENT OF LYMPH NODES (ICD-785.6) Assessment: New  samll rt submand node  plan asked her to talk to PMD  about it  Orders: Est. Patient Level III ([address])  Medications Added to Medication List This Visit: 1)  Dulera 200-5 Mcg/act Aero (Mometasone furo-formoterol fum) .... 2 puffs twice daily  Patient Instructions: 1)  continue singulair, dulera and spiriva 2)  take 2 samples of dulera and spiriva 3)  show your technique again to my nurse 4)  please be 100% compliant with medicines 5)  return to see me in 3 months with spirometry

## 2010-07-30 NOTE — Letter (Signed)
Summary: [facility] Electronics engineer Pulmonary  [address]   [place], [place] [address]   Phone: [phone]  Fax: [phone]    12/11/2009  [facility] [address] [place], [place]  [address]  Dear Ms. Piano,    We have been attempting to contact you to reschedule an  appointment that was scheduled for you on 12-04-09. It is important to keep your appointments with your doctor so we can provide you with the best possible care. Please contact our office at your earliest convenience to reschedule your missed appointment. Thank you.           Sincerely,   Nature conservation officer Pulmonary Division

## 2010-07-30 NOTE — Progress Notes (Signed)
Summary: nos appt  Phone Note Call from Patient   Caller: [email or web address]  Call For: Grantham Hippert Summary of Call: LMTCB x2 to rsc nos from 6/7. Initial call taken by: Darletta Moll,  December 05, 2009 3:04 PM     Appended Document: nos appt call back again in 1 week and if goes to voice mail, pls send certified letter  Appended Document: nos appt LMTCB x3 per MR request will send certified letter. Letter printed and given to Raliegh Scarlet to mail certified.

## 2010-07-30 NOTE — Letter (Signed)
Summary: Certified Letter Receipt  Certified Letter Received   Imported By: Sherian Rein 01/21/2010 14:34:56  _____________________________________________________________________  External Attachment:    Type:   Image     Comment:   External Document

## 2010-10-04 LAB — DIFFERENTIAL
Basophils Absolute: 0 10*3/uL (ref 0.0–0.1)
Basophils Relative: 1 % (ref 0–1)
Eosinophils Relative: 2 % (ref 0–5)
Monocytes Absolute: 0.2 10*3/uL (ref 0.1–1.0)
Monocytes Relative: 5 % (ref 3–12)
Neutro Abs: 2.8 10*3/uL (ref 1.7–7.7)

## 2010-10-04 LAB — POCT CARDIAC MARKERS
CKMB, poc: <1 ng/mL — ABNORMAL LOW (ref 1.0–8.0)
CKMB, poc: <1 ng/mL — ABNORMAL LOW (ref 1.0–8.0)
Myoglobin, poc: 49.1 ng/mL (ref 12–200)
Troponin i, poc: <0.05 ng/mL (ref 0.00–0.09)
Troponin i, poc: <0.05 ng/mL (ref 0.00–0.09)

## 2010-10-04 LAB — COMPREHENSIVE METABOLIC PANEL
AST: 19 U/L (ref 0–37)
Albumin: 4.1 g/dL (ref 3.5–5.2)
Alkaline Phosphatase: 55 U/L (ref 39–117)
BUN: 5 mg/dL — ABNORMAL LOW (ref 6–23)
Chloride: 105 mEq/L (ref 96–112)
GFR calc Af Amer: >60 mL/min (ref >60)
Potassium: 3.3 mEq/L — ABNORMAL LOW (ref 3.5–5.1)
Sodium: 141 mEq/L (ref 135–145)
Total Bilirubin: 0.4 mg/dL (ref 0.3–1.2)
Total Protein: 7.5 g/dL (ref 6.0–8.3)

## 2010-10-04 LAB — CBC
Platelets: 229 10*3/uL (ref 150–400)
RDW: 15.9 % — ABNORMAL HIGH (ref 11.5–15.5)
WBC: 4.2 10*3/uL (ref 4.0–10.5)

## 2011-04-15 ENCOUNTER — Other Ambulatory Visit (HOSPITAL_COMMUNITY)
Admission: RE | Admit: 2011-04-15 | Discharge: 2011-04-15 | Disposition: A | Discharge status: Home / Self Care | Payer: BC Managed Care – PPO | Source: Ambulatory Visit | Attending: Obstetrics and Gynecology | Admitting: Obstetrics and Gynecology

## 2011-04-15 ENCOUNTER — Other Ambulatory Visit: Payer: Self-pay | Admitting: Obstetrics and Gynecology

## 2011-04-15 DIAGNOSIS — Z1159 Encounter for screening for other viral diseases: Secondary | ICD-10-CM | POA: Insufficient documentation

## 2011-04-15 DIAGNOSIS — Z01419 Encounter for gynecological examination (general) (routine) without abnormal findings: Secondary | ICD-10-CM | POA: Insufficient documentation

## 2011-04-21 ENCOUNTER — Other Ambulatory Visit: Payer: Self-pay | Admitting: Obstetrics and Gynecology

## 2011-04-21 DIAGNOSIS — Z1231 Encounter for screening mammogram for malignant neoplasm of breast: Secondary | ICD-10-CM

## 2011-05-05 ENCOUNTER — Ambulatory Visit
Admission: RE | Admit: 2011-05-05 | Discharge: 2011-05-05 | Disposition: A | Discharge status: Home / Self Care | Payer: BC Managed Care – PPO | Source: Ambulatory Visit | Attending: Obstetrics and Gynecology | Admitting: Obstetrics and Gynecology

## 2011-05-05 DIAGNOSIS — Z1231 Encounter for screening mammogram for malignant neoplasm of breast: Secondary | ICD-10-CM

## 2012-06-29 ENCOUNTER — Other Ambulatory Visit (HOSPITAL_COMMUNITY)
Admission: RE | Admit: 2012-06-29 | Discharge: 2012-06-29 | Disposition: A | Discharge status: Home / Self Care | Payer: BC Managed Care – PPO | Source: Ambulatory Visit | Attending: Obstetrics and Gynecology | Admitting: Obstetrics and Gynecology

## 2012-06-29 ENCOUNTER — Other Ambulatory Visit: Payer: Self-pay | Admitting: Obstetrics and Gynecology

## 2012-06-29 DIAGNOSIS — Z01419 Encounter for gynecological examination (general) (routine) without abnormal findings: Secondary | ICD-10-CM | POA: Insufficient documentation

## 2012-08-04 ENCOUNTER — Other Ambulatory Visit: Payer: Self-pay | Admitting: Obstetrics and Gynecology

## 2012-08-04 DIAGNOSIS — Z1231 Encounter for screening mammogram for malignant neoplasm of breast: Secondary | ICD-10-CM

## 2012-08-25 ENCOUNTER — Ambulatory Visit
Admission: RE | Admit: 2012-08-25 | Discharge: 2012-08-25 | Disposition: A | Discharge status: Home / Self Care | Payer: BC Managed Care – PPO | Source: Ambulatory Visit | Attending: Obstetrics and Gynecology | Admitting: Obstetrics and Gynecology

## 2012-09-02 ENCOUNTER — Other Ambulatory Visit: Payer: Self-pay | Admitting: Gastroenterology

## 2012-09-02 ENCOUNTER — Other Ambulatory Visit: Payer: Self-pay | Admitting: Obstetrics and Gynecology

## 2012-09-02 DIAGNOSIS — R928 Other abnormal and inconclusive findings on diagnostic imaging of breast: Secondary | ICD-10-CM

## 2012-09-14 ENCOUNTER — Other Ambulatory Visit: Payer: BC Managed Care – PPO

## 2012-09-21 ENCOUNTER — Encounter (HOSPITAL_COMMUNITY): Payer: Self-pay | Admitting: *Deleted

## 2012-09-24 ENCOUNTER — Ambulatory Visit
Admission: RE | Admit: 2012-09-24 | Discharge: 2012-09-24 | Disposition: A | Discharge status: Home / Self Care | Payer: BC Managed Care – PPO | Source: Ambulatory Visit | Attending: Obstetrics and Gynecology | Admitting: Obstetrics and Gynecology

## 2012-09-24 DIAGNOSIS — R928 Other abnormal and inconclusive findings on diagnostic imaging of breast: Secondary | ICD-10-CM

## 2012-09-29 ENCOUNTER — Encounter (HOSPITAL_COMMUNITY): Payer: Self-pay | Admitting: Pharmacy Technician

## 2012-10-05 ENCOUNTER — Ambulatory Visit (HOSPITAL_COMMUNITY)
Admission: RE | Admit: 2012-10-05 | Discharge: 2012-10-05 | Disposition: A | Discharge status: Home / Self Care | Payer: BC Managed Care – PPO | Source: Ambulatory Visit | Attending: Gastroenterology | Admitting: Gastroenterology

## 2012-10-05 ENCOUNTER — Encounter (HOSPITAL_COMMUNITY): Payer: Self-pay | Admitting: *Deleted

## 2012-10-05 ENCOUNTER — Ambulatory Visit (HOSPITAL_COMMUNITY): Payer: BC Managed Care – PPO | Admitting: Anesthesiology

## 2012-10-05 ENCOUNTER — Encounter (HOSPITAL_COMMUNITY)
Admission: RE | Disposition: A | Discharge status: Home / Self Care | Payer: Self-pay | Source: Ambulatory Visit | Attending: Gastroenterology

## 2012-10-05 ENCOUNTER — Encounter (HOSPITAL_COMMUNITY): Payer: Self-pay | Admitting: Anesthesiology

## 2012-10-05 DIAGNOSIS — Z800 Family history of malignant neoplasm of digestive organs: Secondary | ICD-10-CM | POA: Insufficient documentation

## 2012-10-05 DIAGNOSIS — K219 Gastro-esophageal reflux disease without esophagitis: Secondary | ICD-10-CM | POA: Insufficient documentation

## 2012-10-05 DIAGNOSIS — J45909 Unspecified asthma, uncomplicated: Secondary | ICD-10-CM | POA: Insufficient documentation

## 2012-10-05 DIAGNOSIS — Z1211 Encounter for screening for malignant neoplasm of colon: Secondary | ICD-10-CM | POA: Insufficient documentation

## 2012-10-05 HISTORY — DX: Headache: R51

## 2012-10-05 HISTORY — DX: Unspecified asthma, uncomplicated: J45.909

## 2012-10-05 HISTORY — PX: COLONOSCOPY WITH PROPOFOL: SHX5780

## 2012-10-05 SURGERY — COLONOSCOPY WITH PROPOFOL
Anesthesia: Monitor Anesthesia Care

## 2012-10-05 MED ORDER — MIDAZOLAM HCL 5 MG/5ML IJ SOLN
INTRAMUSCULAR | Status: DC | PRN
  Administered 2012-10-05: 2 mg via INTRAVENOUS

## 2012-10-05 MED ORDER — FENTANYL CITRATE 0.05 MG/ML IJ SOLN
INTRAMUSCULAR | Status: DC | PRN
  Administered 2012-10-05: 50 ug via INTRAVENOUS

## 2012-10-05 MED ORDER — LIDOCAINE HCL (CARDIAC) 20 MG/ML IV SOLN
INTRAVENOUS | Status: DC | PRN
  Administered 2012-10-05: 100 mg via INTRAVENOUS

## 2012-10-05 MED ORDER — LACTATED RINGERS IV SOLN
INTRAVENOUS | Status: DC
  Administered 2012-10-05: 13:00:00 via INTRAVENOUS

## 2012-10-05 MED ORDER — SODIUM CHLORIDE 0.9 % IV SOLN: INTRAVENOUS | Status: DC

## 2012-10-05 MED ORDER — PROMETHAZINE HCL 25 MG/ML IJ SOLN: 6.2500 mg | INTRAMUSCULAR | Status: DC | PRN

## 2012-10-05 MED ORDER — PROPOFOL INFUSION 10 MG/ML OPTIME
INTRAVENOUS | Status: DC | PRN
  Administered 2012-10-05: 75 ug/kg/min via INTRAVENOUS

## 2012-10-05 MED ORDER — KETAMINE HCL 10 MG/ML IJ SOLN
INTRAMUSCULAR | Status: DC | PRN
  Administered 2012-10-05: 10 mg via INTRAVENOUS
  Administered 2012-10-05: 20 mg via INTRAVENOUS

## 2012-10-05 SURGICAL SUPPLY — 22 items

## 2012-10-05 NOTE — Anesthesia Postprocedure Evaluation (Signed)
  Anesthesia Post-op Note  Patient: Victoria [name]  Procedure(s) Performed: Procedure(s) (LRB): COLONOSCOPY WITH PROPOFOL (N/A)  Patient Location: PACU  Anesthesia Type: MAC  Level of Consciousness: awake and alert   Airway and Oxygen Therapy: Patient Spontanous Breathing  Post-op Pain: mild  Post-op Assessment: Post-op Vital signs reviewed, Patient's Cardiovascular Status Stable, Respiratory Function Stable, Patent Airway and No signs of Nausea or vomiting  Last Vitals:  Filed Vitals:   10/05/12 1349  BP: 141/75  Pulse:   Temp: 36.6 C  Resp: 17    Post-op Vital Signs: stable   Complications: No apparent anesthesia complications

## 2012-10-05 NOTE — H&P (Signed)
  Procedure: Baseline screening colonoscopy  History: The patient is a 52 year old female scheduled to undergo her first screening colonoscopy with polypectomy to prevent colon cancer. There is no family history of colon cancer.  Chronic medications: MiraLAX. Colace. Prilosec. Calcium. Vitamin D. Fluticasone. Dulera. Singulair. Proventil. Zyrtec.  Past medical and surgical history: Bilateral tubal ligation. Bilateral bunion surgery. Asthma. Depression since 2007. Gastroesophageal reflux.  Habits: Patient has never smoked cigarettes. She does not consume alcohol.  Allergies: Shellfish cause anaphylaxis. Septra causes anaphylaxis. The patient carries an EpiPen.  Plan: Proceed with baseline screening colonoscopy with polypectomy to prevent colon cancer using propofol sedation.

## 2012-10-05 NOTE — Transfer of Care (Signed)
Immediate Anesthesia Transfer of Care Note  Patient: Victoria [name]  Procedure(s) Performed: Procedure(s): COLONOSCOPY WITH PROPOFOL (N/A)  Patient Location: PACU and Endoscopy Unit  Anesthesia Type:MAC  Level of Consciousness: awake, alert , oriented and patient cooperative  Airway & Oxygen Therapy: Patient Spontanous Breathing and Patient connected to face mask oxygen  Post-op Assessment: Report given to PACU RN, Post -op Vital signs reviewed and stable and Patient moving all extremities  Post vital signs: Reviewed and stable  Complications: No apparent anesthesia complications

## 2012-10-05 NOTE — Preoperative (Signed)
Beta Blockers   Reason not to administer Beta Blockers:Not Applicable 

## 2012-10-05 NOTE — Op Note (Signed)
Procedure: Baseline screening colonoscopy  Endoscopist: Danise Edge  Premedication: Propofol administered by anesthesia  Procedure: The patient was placed in the left lateral decubitus position. Anal inspection and digital rectal exam were normal. The Pentax pediatric colonoscope was introduced into the rectum and advanced to the cecum. A normal-appearing appendiceal orifice and ileocecal valve were identified. Colonic preparation for the exam today was good.  Rectum. Normal. Retroflex view of the distal rectum normal.  Sigmoid colon and descending colon. Normal.  Splenic flexure. Normal.  Transverse colon. Normal.  Hepatic flexure. Normal.  Ascending colon. Normal.  Cecum and ileocecal valve. Normal.  Assessment normal baseline screening proctocolonoscopy to the cecum.  Recommendations: Schedule repeat screening colonoscopy in 10 years.

## 2012-10-05 NOTE — Anesthesia Preprocedure Evaluation (Addendum)
Anesthesia Evaluation  Patient identified by MRN, date of birth, ID band Patient awake    Reviewed: Allergy & Precautions, H&P , NPO status , Patient's Chart, lab work & pertinent test results  History of Anesthesia Complications (+) MALIGNANT HYPERTHERMIA  Airway Mallampati: II TM Distance: >3 FB Neck ROM: Full    Dental no notable dental hx.    Pulmonary asthma ,  breath sounds clear to auscultation  Pulmonary exam normal       Cardiovascular negative cardio ROS  Rhythm:Regular Rate:Normal     Neuro/Psych negative neurological ROS  negative psych ROS   GI/Hepatic negative GI ROS, Neg liver ROS,   Endo/Other  negative endocrine ROS  Renal/GU negative Renal ROS  negative genitourinary   Musculoskeletal negative musculoskeletal ROS (+)   Abdominal   Peds negative pediatric ROS (+)  Hematology negative hematology ROS (+)   Anesthesia Other Findings   Reproductive/Obstetrics negative OB ROS                          Anesthesia Physical Anesthesia Plan  ASA: II  Anesthesia Plan: MAC   Post-op Pain Management:    Induction:   Airway Management Planned: Nasal Cannula  Additional Equipment:   Intra-op Plan:   Post-operative Plan:   Informed Consent: I have reviewed the patients History and Physical, chart, labs and discussed the procedure including the risks, benefits and alternatives for the proposed anesthesia with the patient or authorized representative who has indicated his/her understanding and acceptance.     Plan Discussed with: CRNA and Surgeon  Anesthesia Plan Comments:         Anesthesia Quick Evaluation

## 2012-10-06 ENCOUNTER — Encounter (HOSPITAL_COMMUNITY): Payer: Self-pay | Admitting: Gastroenterology

## 2013-10-14 ENCOUNTER — Ambulatory Visit (INDEPENDENT_AMBULATORY_CARE_PROVIDER_SITE_OTHER): Payer: 59 | Admitting: Internal Medicine

## 2013-10-14 ENCOUNTER — Encounter (INDEPENDENT_AMBULATORY_CARE_PROVIDER_SITE_OTHER): Payer: Self-pay

## 2013-10-14 ENCOUNTER — Encounter: Payer: Self-pay | Admitting: Internal Medicine

## 2013-10-14 ENCOUNTER — Other Ambulatory Visit: Payer: 59

## 2013-10-14 ENCOUNTER — Ambulatory Visit (INDEPENDENT_AMBULATORY_CARE_PROVIDER_SITE_OTHER)
Admission: RE | Admit: 2013-10-14 | Discharge: 2013-10-14 | Disposition: A | Discharge status: Home / Self Care | Payer: 59 | Source: Ambulatory Visit | Attending: Internal Medicine | Admitting: Internal Medicine

## 2013-10-14 VITALS — BP 128/82 | HR 63 | Ht 64.75 in | Wt 185.2 lb

## 2013-10-14 DIAGNOSIS — J449 Chronic obstructive pulmonary disease, unspecified: Secondary | ICD-10-CM

## 2013-10-14 DIAGNOSIS — J309 Allergic rhinitis, unspecified: Secondary | ICD-10-CM

## 2013-10-14 DIAGNOSIS — J45909 Unspecified asthma, uncomplicated: Secondary | ICD-10-CM

## 2013-10-14 MED ORDER — BUDESONIDE-FORMOTEROL FUMARATE 160-4.5 MCG/ACT IN AERO: INHALATION_SPRAY | RESPIRATORY_TRACT | Status: DC

## 2013-10-14 MED ORDER — MOMETASONE FUROATE 50 MCG/ACT NA SUSP: 2.0000 | Freq: Every day | NASAL | Status: DC

## 2013-10-14 MED ORDER — MONTELUKAST SODIUM 10 MG PO TABS: 10.0000 mg | ORAL_TABLET | Freq: Every day | ORAL | Status: DC

## 2013-10-14 MED ORDER — THEOPHYLLINE ER 200 MG PO TB12: 200.0000 mg | ORAL_TABLET | Freq: Two times a day (BID) | ORAL | Status: DC

## 2013-10-14 MED ORDER — BUDESONIDE-FORMOTEROL FUMARATE 160-4.5 MCG/ACT IN AERO: 2.0000 | INHALATION_SPRAY | Freq: Two times a day (BID) | RESPIRATORY_TRACT | Status: DC

## 2013-10-14 NOTE — Patient Instructions (Addendum)
Order CXR- dx chronic obstructive asthma  Order- lab- Allergy profile  Sample and script for Symbicort 160      2 puffs then rinse mouth well, twice daily     Try this and price compare instead of Dulera  Script sent to add theophylline twice daily with meals as a bronchodilator to open airways  Script sent to refill Nasonex  Script printed to refill Singulair

## 2013-10-14 NOTE — Progress Notes (Signed)
10/14/13- 8 yoF never smoker Self Referral-asthma and allergies; Previous MR patient. Last seen here in 2011 for asthma regimen diagnosed age 53 without hospitalization or mechanical ventilation. Positive allergy testing in 2008 for common environmental allergens. Allergic rhinitis. Office spirometry 07/27/2009 documented FEV1 1.34/53%, FVC 2.12/68.8%, FEV1/FEC 0.63. 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 Original skin testing by Dr Caprice Red, then followed by Dr Ishmael Holter until insurance change. She says neither allergy vaccine or Xolair times one year trial were helpful. No ENT surgery. No recent sinus infections. She denies GERD. Triggers: Season changes, pets, virus infections, house dust, strong odors. Environment: House, no pets, smokers or mold/water issues. 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 Father had emphysema, deceased.  Prior to Admission medications   Medication Sig Start Date End Date Taking? Authorizing Provider  cetirizine (ZYRTEC) 10 MG tablet Take 10 mg by mouth daily.   Yes Historical Provider, MD  hydrochlorothiazide (HYDRODIURIL) 25 MG tablet Take 1 tablet by mouth daily. 10/07/13  Yes Historical Provider, MD  loratadine (CLARITIN) 10 MG tablet Take 10 mg by mouth daily.   Yes Historical Provider, MD  Misc Natural Products (COLON CLEANSER PO) Take 2 capsules by mouth daily.   Yes Historical Provider, MD  mometasone (NASONEX) 50 MCG/ACT nasal spray Place 2 sprays into the nose daily. 10/14/13  Yes Deneise Lever, MD  budesonide-formoterol [facility]) 160-4.5 MCG/ACT inhaler 2 puffs then rise mouth, twice daily 10/14/13   Deneise Lever, MD  budesonide-formoterol [facility]) 160-4.5 MCG/ACT inhaler Inhale 2 puffs into the lungs 2 (two) times daily. RINSE MOUTH WELL AFTER USE 10/14/13   Deneise Lever, MD  montelukast (SINGULAIR) 10 MG tablet Take 1 tablet (10 mg total)  by mouth at bedtime. 10/18/13   Deneise Lever, MD  theophylline (THEODUR) 200 MG 12 hr tablet Take 1 tablet (200 mg total) by mouth 2 (two) times daily. 10/14/13   Deneise Lever, MD   Past Medical History  Diagnosis Date  . Asthma   . Headache(784.0)     tension  . Allergic rhinitis   . Hypertension    Past Surgical History  Procedure Laterality Date  . Tubal ligation    . Colonoscopy with propofol N/A 10/05/2012    Procedure: COLONOSCOPY WITH PROPOFOL;  Surgeon: Garlan Fair, MD;  Location: [facility] ENDOSCOPY;  Service: Endoscopy;  Laterality: N/A;   Family History  Problem Relation Age of Onset  . Emphysema Father   . Hypertension Mother   . High Cholesterol Mother   . Osteoporosis Mother   . Asthma Son     exercise induced  . Migraines Daughter    History   Social History  . Marital Status: Married    Spouse Name: N/A    Number of Children: 2  . Years of Education: N/A   Occupational History  . Inventory Spec,    Social History Main Topics  . Smoking status: Never Smoker   . Smokeless tobacco: Never Used  . Alcohol Use: No  . Drug Use: No  . Sexual Activity: Not on file   Other Topics Concern  . Not on file   Social History Narrative  . No narrative on file   ROS-see HPI Constitutional:   No-   weight loss, night sweats, fevers, chills, fatigue, lassitude. HEENT:   No-  headaches, difficulty swallowing, tooth/dental problems, sore throat,       No-  sneezing, itching, ear ache, +nasal  congestion, post nasal drip,  CV:  No-   chest pain, orthopnea, PND, swelling in lower extremities, anasarca,                                  dizziness, palpitations Resp: +shortness of breath with exertion or at rest.              No-   productive cough,  + non-productive cough,  No- coughing up of blood.              No-   change in color of mucus.  +wheezing.   Skin: No-   rash or lesions. GI:  No-   heartburn, indigestion, abdominal pain, nausea, vomiting, diarrhea,                  change in bowel habits, loss of appetite GU: No-   dysuria, change in color of urine, no urgency or frequency.  No- flank pain. MS:  No-   joint pain or swelling.  No- decreased range of motion.  No- back pain. Neuro-     nothing unusual Psych:  No- change in mood or affect. No depression or anxiety.  No memory loss.  OBJ- Physical Exam General- Alert, Oriented, Affect-appropriate, Distress- none acute Skin- rash-none, lesions- none, excoriation- none Lymphadenopathy- none Head- atraumatic            Eyes- Gross vision intact, PERRLA, conjunctivae and secretions clear            Ears- Hearing, canals-normal            Nose- +turbinate edema, no-Septal dev, mucus, polyps, erosion, perforation             Throat- Mallampati III , mucosa clear , drainage- none, tonsils- atrophic Neck- flexible , trachea midline, no stridor , thyroid nl, carotid no bruit Chest - symmetrical excursion , unlabored           Heart/CV- RRR , no murmur , no gallop  , no rub, nl s1 s2                           - JVD- none , edema- none, stasis changes- none, varices- none           Lung- clear to P&A, wheeze- none, cough- none , dullness-none, rub- none           Chest wall-  Abd- tender-no, distended-no, bowel sounds-present, HSM- no Br/ Gen/ Rectal- Not done, not indicated Extrem- cyanosis- none, clubbing, none, atrophy- none, strength- nl Neuro- grossly intact to observation

## 2013-10-17 LAB — ALLERGY FULL PROFILE
Allergen, D pternoyssinus,d7: 24 kU/L — ABNORMAL HIGH
Allergen,Goose feathers, e70: <0.1 kU/L
Alternaria Alternata: 0.21 kU/L — ABNORMAL HIGH
Bahia Grass: <0.1 kU/L
Box Elder IgE: <0.1 kU/L
Cat Dander: 4.85 kU/L — ABNORMAL HIGH
Common Ragweed: 1.05 kU/L — ABNORMAL HIGH
D. FARINAE: 38.3 kU/L — AB
Dog Dander: 0.62 kU/L — ABNORMAL HIGH
Elm IgE: <0.1 kU/L
G009 RED TOP: 0.37 kU/L — AB
Goldenrod: 0.35 kU/L — ABNORMAL HIGH
Helminthosporium halodes: <0.1 kU/L
House Dust Hollister: 2.83 kU/L — ABNORMAL HIGH
IgE (Immunoglobulin E), Serum: 241.8 IU/mL — ABNORMAL HIGH (ref 0.0–180.0)
Lamb's Quarters: 0.16 kU/L — ABNORMAL HIGH
Plantain: <0.1 kU/L
Stemphylium Botryosum: <0.1 kU/L
Sycamore Tree: <0.1 kU/L

## 2013-10-18 ENCOUNTER — Telehealth: Payer: Self-pay | Admitting: Internal Medicine

## 2013-10-18 MED ORDER — MONTELUKAST SODIUM 10 MG PO TABS: 10.0000 mg | ORAL_TABLET | Freq: Every day | ORAL | Status: DC

## 2013-10-18 NOTE — Telephone Encounter (Signed)
Spoke with pt. Aware RX has been sent in. Nothing further needed 

## 2013-11-13 NOTE — Assessment & Plan Note (Signed)
Moderate persistent asthma-uncertain fixed component and a never smoker. Probably allergic and nonallergic triggers. We need to redefine pattern as well as potential response to bronchodilator. Plan- dust mite encasings for bedding, lab allergy profile, PFT, chest x-ray, , Singulair-try every other day, theophylline, cost compare Symbicort

## 2013-11-13 NOTE — Assessment & Plan Note (Signed)
Plan-Nasonex

## 2013-12-01 ENCOUNTER — Ambulatory Visit: Payer: 59 | Admitting: Internal Medicine

## 2013-12-12 ENCOUNTER — Ambulatory Visit (INDEPENDENT_AMBULATORY_CARE_PROVIDER_SITE_OTHER): Payer: 59 | Admitting: Gynecology

## 2013-12-12 ENCOUNTER — Encounter: Payer: Self-pay | Admitting: Gynecology

## 2013-12-12 VITALS — Ht 64.0 in | Wt 184.0 lb

## 2013-12-12 DIAGNOSIS — Z0000 Encounter for general adult medical examination without abnormal findings: Secondary | ICD-10-CM

## 2013-12-12 DIAGNOSIS — Z124 Encounter for screening for malignant neoplasm of cervix: Secondary | ICD-10-CM

## 2013-12-12 DIAGNOSIS — Z01419 Encounter for gynecological examination (general) (routine) without abnormal findings: Secondary | ICD-10-CM

## 2013-12-12 DIAGNOSIS — K5900 Constipation, unspecified: Secondary | ICD-10-CM

## 2013-12-12 LAB — POCT URINALYSIS DIPSTICK
Blood, UA: 2
LEUKOCYTES UA: NEGATIVE
Urobilinogen, UA: NEGATIVE
pH, UA: 5

## 2013-12-12 LAB — MAGNESIUM: Magnesium: 1.8 mg/dL (ref 1.5–2.5)

## 2013-12-12 NOTE — Patient Instructions (Signed)
Constipation, Adult Constipation is when a person has fewer than 3 bowel movements a week; has difficulty having a bowel movement; or has stools that are dry, hard, or larger than normal. As people grow older, constipation is more common. If you try to fix constipation with medicines that make you have a bowel movement (laxatives), the problem may get worse. Long-term laxative use may cause the muscles of the colon to become weak. A low-fiber diet, not taking in enough fluids, and taking certain medicines may make constipation worse. CAUSES   Certain medicines, such as antidepressants, pain medicine, iron supplements, antacids, and water pills.   Certain diseases, such as diabetes, irritable bowel syndrome (IBS), thyroid disease, or depression.   Not drinking enough water.   Not eating enough fiber-rich foods.   Stress or travel.  Lack of physical activity or exercise.  Not going to the restroom when there is the urge to have a bowel movement.  Ignoring the urge to have a bowel movement.  Using laxatives too much. SYMPTOMS   Having fewer than 3 bowel movements a week.   Straining to have a bowel movement.   Having hard, dry, or larger than normal stools.   Feeling full or bloated.   Pain in the lower abdomen.  Not feeling relief after having a bowel movement. DIAGNOSIS  Your caregiver will take a medical history and perform a physical exam. Further testing may be done for severe constipation. Some tests may include:   A barium enema X-ray to examine your rectum, colon, and sometimes, your small intestine.  A sigmoidoscopy to examine your lower colon.  A colonoscopy to examine your entire colon. TREATMENT  Treatment will depend on the severity of your constipation and what is causing it. Some dietary treatments include drinking more fluids and eating more fiber-rich foods. Lifestyle treatments may include regular exercise. If these diet and lifestyle recommendations  do not help, your caregiver may recommend taking over-the-counter laxative medicines to help you have bowel movements. Prescription medicines may be prescribed if over-the-counter medicines do not work.  HOME CARE INSTRUCTIONS   Increase dietary fiber in your diet, such as fruits, vegetables, whole grains, and beans. Limit high-fat and processed sugars in your diet, such as [place] fries, hamburgers, cookies, candies, and soda.   A fiber supplement may be added to your diet if you cannot get enough fiber from foods.   Drink enough fluids to keep your urine clear or pale yellow.   Exercise regularly or as directed by your caregiver.   Go to the restroom when you have the urge to go. Do not hold it.  Only take medicines as directed by your caregiver. Do not take other medicines for constipation without talking to your caregiver first. [place] IF:   You have bright red blood in your stool.   Your constipation lasts for more than 4 days or gets worse.   You have abdominal or rectal pain.   You have thin, pencil-like stools.  You have unexplained weight loss. MAKE SURE YOU:   Understand these instructions.  Will watch your condition.  Will get help right away if you are not doing well or get worse. Document Released: 03/14/2004 Document Revised: 09/08/2011 Document Reviewed: 03/28/2013 [facility] Patient Information 2014 [place], [place].  OTC MAGNESIUM SUPPLEMENT, MIRALAX-DIVIDED DOSE

## 2013-12-12 NOTE — Progress Notes (Signed)
53 y.o. Married african american female   [phone] here for annual exam. Pt reports menses are absent to due to Menopause. She occassionally  report hot flashes, does not have night sweats, does nothave vaginal dryness.  She is not using lubricants.  She does not report post-menopasual bleeding.  Pt states that she had had irregular bowel habits but it seems to have gotten worse.  Pt states normal colonoscopy in 2014, she was using miralax, benefiber and colace. Bowel movements every other day, large volume, denies any straining, need for tenting or placing fingers in vagina.   Pt denies fibroids. Pt drinks about 10c liquid/d   Patient's last menstrual period was 03/30/2012.          Sexually active: yes  The current method of family planning is tubal ligation.    Exercising: no  The patient does not participate in regular exercise at present. Last pap: 06/29/12 Negative, 09/15/10-ASCUS NEG HPV Abnormal PAP: yes had colpo bx normal ever since 6-7 years ago Mammogram: 09/01/12 Bi-rads 0, [place] 09/24/12-BREAST CYSTS ON RIGHT  f/u in 1 year BSE: yes  Colonoscopy: 08/2012  Normal f/u in 10 years DEXA: never had one Alcohol: no Tobacco: no  Urine: Blood 2  Health Maintenance  Topic Date Due  . Tetanus/tdap  08/15/1981  . Colonoscopy  08/15/2012  . Influenza Vaccine  01/28/2014  . Mammogram  09/25/2014  . Pap Smear  06/30/2015    Family History  Problem Relation Age of Onset  . Emphysema Father   . High Cholesterol Mother   . Osteoporosis Mother   . Asthma Son     exercise induced  . Migraines Daughter   . Lung cancer Father   . Hypertension Mother   . Hypertension Son   . Hypertension Sister     Patient Active Problem List   Diagnosis Date Noted  . CANDIDIASIS, ORAL 01/08/2010  . ENLARGEMENT OF LYMPH NODES 10/05/2009  . ALLERGIC RHINITIS 10/05/2007  . Unspecified asthma(493.90) 10/05/2007    Past Medical History  Diagnosis Date  . Asthma   . Headache(784.0)     tension  .  Allergic rhinitis   . Hypertension     Past Surgical History  Procedure Laterality Date  . Tubal ligation  27 years ago  . Colonoscopy with propofol N/A 10/05/2012    Procedure: COLONOSCOPY WITH PROPOFOL;  Surgeon: Garlan Fair, MD;  Location: [facility];  Service: Endoscopy;  Laterality: N/A;    Allergies: Sulfonamide derivatives  Current Outpatient Prescriptions  Medication Sig Dispense Refill  . budesonide-formoterol (SYMBICORT) 160-4.5 MCG/ACT inhaler Inhale 2 puffs into the lungs 2 (two) times daily. RINSE MOUTH WELL AFTER USE  1 Inhaler  0  . hydrochlorothiazide (HYDRODIURIL) 25 MG tablet Take 1 tablet by mouth daily.      Marland Kitchen loratadine (CLARITIN) 10 MG tablet Take 10 mg by mouth as needed.       . montelukast (SINGULAIR) 10 MG tablet Take 1 tablet (10 mg total) by mouth at bedtime.  90 tablet  3  . theophylline (THEODUR) 200 MG 12 hr tablet Take 1 tablet (200 mg total) by mouth 2 (two) times daily.  60 tablet  prn  . cetirizine (ZYRTEC) 10 MG tablet Take 10 mg by mouth as needed.       . mometasone (NASONEX) 50 MCG/ACT nasal spray Place 2 sprays into the nose as needed.       No current facility-administered medications for this visit.    ROS: Pertinent  items are noted in HPI.  Exam:    Ht 5\' 4"  (1.626 m)  Wt 184 lb (83.462 kg)  BMI 31.57 kg/m2  LMP 03/30/2012 Weight change: @WEIGHTCHANGE @ Last 3 height recordings:  Ht Readings from Last 3 Encounters:  12/12/13 5\' 4"  (1.626 m)  10/14/13 5' 4.75" (1.645 m)  10/05/12 5' 5.5" (1.664 m)   General appearance: alert, cooperative and appears stated age Head: Normocephalic, without obvious abnormality, atraumatic Neck: no adenopathy, no carotid bruit, no JVD, supple, symmetrical, trachea midline and thyroid not enlarged, symmetric, no tenderness/mass/nodules Lungs: clear to auscultation bilaterally Breasts: normal appearance, no masses or tenderness Heart: regular rate and rhythm, S1, S2 normal, no murmur, click, rub or  gallop Abdomen: soft, non-tender; bowel sounds normal; no masses,  no organomegaly Extremities: extremities normal, atraumatic, no cyanosis or edema Skin: Skin color, texture, turgor normal. No rashes or lesions Lymph nodes: Cervical, supraclavicular, and axillary nodes normal. no inguinal nodes palpated Neurologic: Grossly normal   Pelvic: External genitalia:  no lesions              Urethra: normal appearing urethra with no masses, tenderness or lesions              Bartholins and Skenes: normal                 Vagina: normal appearing vagina with normal color and discharge, no lesions              Cervix: normal appearance              Pap taken: yes        Bimanual Exam:  Uterus:  uterus is normal size, shape, consistency and nontender                                      Adnexa:    no masses                                      Rectovaginal: Confirms, NO STOOL IN RECTUM                                      Anus:  normal sphincter tone, no lesions 1. Routine gynecological examination counseled on breast self exam, mammography screening, menopause, adequate intake of calcium and vitamin D, diet and exercise return annually or prn Discussed PAP guideline changes, importance of weight bearing exercises, calcium, vit D and balanced diet.    2. Laboratory examination ordered as part of a routine general medical examination  - POCT Urinalysis Dipstick  3. Unspecified constipation Diet, exercise reviewed, may want to try otc magnesium supplement, con't miralax Refer back to GI re upset after eating glutens\ May benefit from linzess - Magnesium - TSH  4. Screening for cervical cancer ASCUS NEG HPV 2012,  PAP WITH HPV today, guidelines reviewd  A: well woman      P:An After Visit Summary was printed and given to the patient.

## 2013-12-13 LAB — TSH: TSH: 1.198 u[IU]/mL (ref 0.350–4.500)

## 2013-12-16 LAB — IPS PAP TEST WITH HPV

## 2014-01-02 ENCOUNTER — Encounter: Payer: Self-pay | Admitting: Internal Medicine

## 2014-01-02 ENCOUNTER — Ambulatory Visit (INDEPENDENT_AMBULATORY_CARE_PROVIDER_SITE_OTHER): Payer: 59 | Admitting: Internal Medicine

## 2014-01-02 ENCOUNTER — Other Ambulatory Visit: Payer: Self-pay | Admitting: Gynecology

## 2014-01-02 VITALS — BP 116/74 | HR 65 | Ht 64.75 in | Wt 187.0 lb

## 2014-01-02 DIAGNOSIS — J45909 Unspecified asthma, uncomplicated: Secondary | ICD-10-CM

## 2014-01-02 DIAGNOSIS — J3089 Other allergic rhinitis: Principal | ICD-10-CM

## 2014-01-02 DIAGNOSIS — Z1231 Encounter for screening mammogram for malignant neoplasm of breast: Secondary | ICD-10-CM

## 2014-01-02 DIAGNOSIS — J302 Other seasonal allergic rhinitis: Secondary | ICD-10-CM

## 2014-01-02 DIAGNOSIS — J309 Allergic rhinitis, unspecified: Secondary | ICD-10-CM

## 2014-01-02 MED ORDER — ALBUTEROL SULFATE HFA 108 (90 BASE) MCG/ACT IN AERS: 2.0000 | INHALATION_SPRAY | Freq: Four times a day (QID) | RESPIRATORY_TRACT | Status: DC | PRN

## 2014-01-02 NOTE — Patient Instructions (Addendum)
We are going to try for now without a maintenance inhaler like Symbicort or Dulera because we aren't sure they were worth the cost for you.  Ok to continue Singulair and theophylliine  Refill script for Proventil rescue inhaler to use if needed- sent  Ok to use an otfc antihistamine for allergy if needed- benadryl, claritin, allegra, zyrtec

## 2014-01-02 NOTE — Progress Notes (Signed)
10/14/13- 53 yoF never smoker Self Referral-asthma and allergies; Previous MR patient. Last seen here in 2011 for asthma regimen diagnosed age 53 without hospitalization or mechanical ventilation. Positive allergy testing in 2008 for common environmental allergens. Allergic rhinitis. Office spirometry 07/27/2009 documented FEV1 1.34/53%, FVC 2.12/68.8%, FEV1/FEC 0.63. 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 Original skin testing by Dr Caprice Red, then followed by Dr Ishmael Holter until insurance change. She says neither allergy vaccine or Xolair times one year trial were helpful. No ENT surgery. No recent sinus infections. She denies GERD. Triggers: Season changes, pets, virus infections, house dust, strong odors. Environment: House, no pets, smokers or mold/water issues. 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 Father had emphysema, deceased.  2014/01/14- 53 yoF never smoker Self Referral-asthma and allergies; Previous MR patient. FOLLOWS FOR: had bump in eye-went to eye MD and told to follow up here. Having runny nose today. Took Benadryl due to itching. Symbicort too expensive($80) and Dulera was  $75. Has swelling around her eyes which resolved with Benadryl. Eye doctor told her to come here-allergy. She is sure she needs to continue Singulair and takes theophylline helps but does not miss the inhalers.  ROS-see HPI Constitutional:   No-   weight loss, night sweats, fevers, chills, fatigue, lassitude. HEENT:   No-  headaches, difficulty swallowing, tooth/dental problems, sore throat,       No-  sneezing, +itching, no-ear ache, +nasal congestion, post nasal drip,  CV:  No-   chest pain, orthopnea, PND, swelling in lower extremities, anasarca,                                  dizziness, palpitations Resp: +shortness of breath with exertion or at rest.              No-   productive cough,  +  non-productive cough,  No- coughing up of blood.              No-   change in color of mucus.  +wheezing.   Skin: No-   rash or lesions. GI:  No-   heartburn, indigestion, abdominal pain, nausea, vomiting,  GU:  MS:  No-   joint pain or swelling.   Neuro-     nothing unusual Psych:  No- change in mood or affect. No depression or anxiety.  No memory loss.  OBJ- Physical Exam General- Alert, Oriented, Affect-appropriate, Distress- none acute Skin- rash-none, lesions- none, excoriation- none Lymphadenopathy- none Head- atraumatic            Eyes- Gross vision intact, PERRLA, conjunctivae and secretions clear, no periorbital                       edema now            Ears- Hearing, canals-normal            Nose- +turbinate edema, no-Septal dev, mucus, polyps, erosion, perforation             Throat- Mallampati III , mucosa clear , drainage- none, tonsils- atrophic Neck- flexible , trachea midline, no stridor , thyroid nl, carotid no bruit Chest - symmetrical excursion , unlabored           Heart/CV- RRR , no murmur , no gallop  , no rub, nl s1 s2                           -  JVD- none , edema- none, stasis changes- none, varices- none           Lung- clear to P&A, wheeze- none, cough- none , dullness-none, rub- none           Chest wall-  Abd-  Br/ Gen/ Rectal- Not done, not indicated Extrem- cyanosis- none, clubbing, none, atrophy- none, strength- nl Neuro- grossly intact to observation     

## 2014-01-05 ENCOUNTER — Ambulatory Visit (HOSPITAL_COMMUNITY)
Admission: RE | Admit: 2014-01-05 | Discharge: 2014-01-05 | Disposition: A | Discharge status: Home / Self Care | Payer: 59 | Source: Ambulatory Visit | Attending: Gynecology | Admitting: Gynecology

## 2014-01-05 DIAGNOSIS — Z1231 Encounter for screening mammogram for malignant neoplasm of breast: Secondary | ICD-10-CM | POA: Insufficient documentation

## 2014-01-12 ENCOUNTER — Telehealth: Payer: Self-pay | Admitting: Internal Medicine

## 2014-01-12 MED ORDER — PREDNISONE 10 MG PO TABS: ORAL_TABLET | ORAL | Status: DC

## 2014-01-12 NOTE — Telephone Encounter (Signed)
Spoke with the pt and notified of recs per CDY Pt verbalized understanding  Appt was scheduled  Rx was sent to pharm

## 2014-01-12 NOTE — Telephone Encounter (Signed)
Per CY-lets have patient take Prednisone 10 mg #20 take 2 po qd x 3 days, then 1 po qd until seen with no refills; pt needs to come in for OV with CY on Monday 01-16-14 at 1:30pm or 2:30pm. This is not a time that we would do skin testing; we need to get her hives under control first. Thanks.

## 2014-01-12 NOTE — Telephone Encounter (Signed)
Pt requesting allergy testing d/t increased itching and allergy symptoms Pt reports that she has not changed anything in her diet or eaten anything new in the last week. Itching during day and all through night-- all over body Pt reports that skin is "whelping" all over body Denies any breathing issues.  Pt has tried Benadryl OTC for itching -- cannot take during the day d/t its sedating side effects.   Allergies  Allergen Reactions  . Sulfonamide Derivatives     REACTION: throat swelling and itching   Please advise Dr Annamaria Boots. Thanks.  Current Outpatient Prescriptions on File Prior to Visit  Medication Sig Dispense Refill  . albuterol (PROVENTIL HFA;VENTOLIN HFA) 108 (90 BASE) MCG/ACT inhaler Inhale 2 puffs into the lungs every 6 (six) hours as needed for wheezing or shortness of breath.  1 Inhaler  prn  . cetirizine (ZYRTEC) 10 MG tablet Take 10 mg by mouth as needed.       . hydrochlorothiazide (HYDRODIURIL) 25 MG tablet Take 1 tablet by mouth daily.      Marland Kitchen loratadine (CLARITIN) 10 MG tablet Take 10 mg by mouth as needed.       . mometasone (NASONEX) 50 MCG/ACT nasal spray Place 2 sprays into the nose as needed.      . montelukast (SINGULAIR) 10 MG tablet Take 1 tablet (10 mg total) by mouth at bedtime.  90 tablet  3  . Probiotic Product (PROBIOTIC DAILY PO) Take 1 capsule by mouth daily.      . theophylline (THEODUR) 200 MG 12 hr tablet Take 1 tablet (200 mg total) by mouth 2 (two) times daily.  60 tablet  prn   No current facility-administered medications on file prior to visit.

## 2014-01-16 ENCOUNTER — Encounter: Payer: Self-pay | Admitting: Internal Medicine

## 2014-01-16 ENCOUNTER — Ambulatory Visit (INDEPENDENT_AMBULATORY_CARE_PROVIDER_SITE_OTHER): Payer: 59 | Admitting: Internal Medicine

## 2014-01-16 VITALS — BP 122/70 | HR 57 | Ht 64.75 in | Wt 186.6 lb

## 2014-01-16 DIAGNOSIS — J4520 Mild intermittent asthma, uncomplicated: Secondary | ICD-10-CM

## 2014-01-16 DIAGNOSIS — J45909 Unspecified asthma, uncomplicated: Secondary | ICD-10-CM

## 2014-01-16 DIAGNOSIS — J302 Other seasonal allergic rhinitis: Secondary | ICD-10-CM

## 2014-01-16 DIAGNOSIS — J3089 Other allergic rhinitis: Secondary | ICD-10-CM

## 2014-01-16 DIAGNOSIS — J309 Allergic rhinitis, unspecified: Secondary | ICD-10-CM

## 2014-01-16 DIAGNOSIS — L500 Allergic urticaria: Secondary | ICD-10-CM

## 2014-01-16 NOTE — Progress Notes (Signed)
10/14/13- 23 yoF never smoker Self Referral-asthma and allergies; Previous MR patient. Last seen here in 2011 for asthma regimen diagnosed age 53 without hospitalization or mechanical ventilation. Positive allergy testing in 2008 for common environmental allergens. Allergic rhinitis. Office spirometry 07/27/2009 documented FEV1 1.34/53%, FVC 2.12/68.8%, FEV1/FEC 0.63. 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 Original skin testing by Dr Caprice Red, then followed by Dr Ishmael Holter until insurance change. She says neither allergy vaccine or Xolair times one year trial were helpful. No ENT surgery. No recent sinus infections. She denies GERD. Triggers: Season changes, pets, virus infections, house dust, strong odors. Environment: House, no pets, smokers or mold/water issues. 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 Father had emphysema, deceased.  01-20-14- [name] never smoker Self Referral-asthma and allergies; Previous MR patient. FOLLOWS FOR: had bump in eye-went to eye MD and told to follow up here. Having runny nose today. Took Benadryl due to itching. Symbicort too expensive($80) and Dulera was  $75.  01/16/14 FOLLOWS FOR:  C/O: itching on back and legs and then those areas are getting hives over the past week Prednisone called in 4 days ago for 1 week of hives/ itching. No change of exposure or environment identified. Benadryl helps but sleepy.  Asthma "great" on singulair, rescue inhaler  ROS-see HPI Constitutional:   No-   weight loss, night sweats, fevers, chills, fatigue, lassitude. HEENT:   No-  headaches, difficulty swallowing, tooth/dental problems, sore throat,       No-  sneezing, itching, ear ache, +nasal congestion, post nasal drip,  CV:  No-   chest pain, orthopnea, PND, swelling in lower extremities, anasarca,                                  dizziness, palpitations Resp: +shortness  of breath with exertion or at rest.              No-   productive cough,  + non-productive cough,  No- coughing up of blood.              No-   change in color of mucus.  +wheezing.   Skin: +HPI. GI:  No-   heartburn, indigestion, abdominal pain, nausea, vomiting, GU:  MS:  No-   joint pain or swelling.   Neuro-     nothing unusual Psych:  No- change in mood or affect. No depression or anxiety.  No memory loss.  OBJ- Physical Exam General- Alert, Oriented, Affect-appropriate, Distress- none acute Skin- rash-none, lesions- none, excoriation- none Lymphadenopathy- none Head- atraumatic            Eyes- Gross vision intact, PERRLA, conjunctivae and secretions clear            Ears- Hearing, canals-normal            Nose- +turbinate edema, no-Septal dev, mucus, polyps, erosion, perforation             Throat- Mallampati III , mucosa clear , drainage- none, tonsils- atrophic Neck- flexible , trachea midline, no stridor , thyroid nl, carotid no bruit Chest - symmetrical excursion , unlabored           Heart/CV- RRR , no murmur , no gallop  , no rub, nl s1 s2                           -  JVD- none , edema- none, stasis changes- none, varices- none           Lung- clear to P&A now, wheeze- none, cough- none , dullness-none, rub- none           Chest wall-  Abd-  Br/ Gen/ Rectal- Not done, not indicated Extrem- cyanosis- none, clubbing, none, atrophy- none, strength- nl Neuro- grossly intact to observation

## 2014-01-16 NOTE — Assessment & Plan Note (Signed)
Plan-continue Singulair and theophyllinewith rescue inhaler available. Stop LABA/ ICS

## 2014-01-16 NOTE — Assessment & Plan Note (Signed)
Better control for her nose except she may have had periorbital edema related to nasal congestion and improved by Benadryl. Doubt sinusitis Plan-continue Singulair, OTC antihistamine

## 2014-01-16 NOTE — Patient Instructions (Signed)
Try taking a non-sedating antihistamine like Allegra 180/cetirizine, once daily for itching and hives.  If needed, you  CAN take benadryl on top of the Allegra for breakthrough itching and hives.  Please call as needed

## 2014-04-04 ENCOUNTER — Ambulatory Visit: Payer: 59 | Admitting: Internal Medicine

## 2014-04-05 ENCOUNTER — Encounter: Payer: Self-pay | Admitting: Internal Medicine

## 2014-04-25 DIAGNOSIS — L500 Allergic urticaria: Secondary | ICD-10-CM | POA: Insufficient documentation

## 2014-04-25 NOTE — Assessment & Plan Note (Signed)
Better control now w singualir and rescue inhaler

## 2014-04-25 NOTE — Assessment & Plan Note (Signed)
Antinhistamines discussed

## 2014-04-25 NOTE — Assessment & Plan Note (Signed)
1 week of hives- new problem. Etiology unclear. Plan- finish prednisone, started after hives, maintenance antihistamines. Watch need for H1/H2 strategy.

## 2014-05-01 ENCOUNTER — Encounter: Payer: Self-pay | Admitting: Internal Medicine

## 2014-08-11 ENCOUNTER — Encounter (INDEPENDENT_AMBULATORY_CARE_PROVIDER_SITE_OTHER): Payer: Self-pay

## 2014-08-11 ENCOUNTER — Encounter: Payer: Self-pay | Admitting: Internal Medicine

## 2014-08-11 ENCOUNTER — Ambulatory Visit (INDEPENDENT_AMBULATORY_CARE_PROVIDER_SITE_OTHER): Payer: 59 | Admitting: Internal Medicine

## 2014-08-11 VITALS — BP 120/72 | HR 91 | Ht 64.75 in | Wt 176.2 lb

## 2014-08-11 DIAGNOSIS — J45901 Unspecified asthma with (acute) exacerbation: Secondary | ICD-10-CM | POA: Insufficient documentation

## 2014-08-11 DIAGNOSIS — L500 Allergic urticaria: Secondary | ICD-10-CM

## 2014-08-11 DIAGNOSIS — J4520 Mild intermittent asthma, uncomplicated: Secondary | ICD-10-CM

## 2014-08-11 DIAGNOSIS — J309 Allergic rhinitis, unspecified: Secondary | ICD-10-CM

## 2014-08-11 DIAGNOSIS — J302 Other seasonal allergic rhinitis: Secondary | ICD-10-CM

## 2014-08-11 DIAGNOSIS — J3089 Other allergic rhinitis: Principal | ICD-10-CM

## 2014-08-11 DIAGNOSIS — J4541 Moderate persistent asthma with (acute) exacerbation: Secondary | ICD-10-CM

## 2014-08-11 MED ORDER — PROMETHAZINE-CODEINE 6.25-10 MG/5ML PO SYRP: 5.0000 mL | ORAL_SOLUTION | Freq: Four times a day (QID) | ORAL | Status: DC | PRN

## 2014-08-11 MED ORDER — AZITHROMYCIN 250 MG PO TABS: ORAL_TABLET | ORAL | Status: DC

## 2014-08-11 MED ORDER — METHYLPREDNISOLONE ACETATE 80 MG/ML IJ SUSP
80.0000 mg | Freq: Once | INTRAMUSCULAR | Status: AC
  Administered 2014-08-11: 80 mg via INTRAMUSCULAR

## 2014-08-11 NOTE — Assessment & Plan Note (Signed)
No further urticaria since she has been avoiding shrimp product. This suggests possibility of food allergy-shrimp

## 2014-08-11 NOTE — Assessment & Plan Note (Signed)
Acute exacerbation consistent with a viral triggered respiratory infection. Discussed options. Plan-nebulizer Xopenex 0.63, Depo-Medrol, Z-Pak, fluids, cough syrup, Tylenol for headache

## 2014-08-11 NOTE — Patient Instructions (Signed)
Neb xop 0.63  Depo 80  Script sent for Z [employer] for prometh codeine cough syrup  Extra fluids, avoid getting exhausted or chilled. Try wearing a scarf across nose and mouth to keep out the cold air when outdoors.

## 2014-08-11 NOTE — Progress Notes (Signed)
10/14/13- 35 yoF never smoker Self Referral-asthma and allergies; Previous MR patient. Last seen here in 2011 for asthma regimen diagnosed age 54 without hospitalization or mechanical ventilation. Positive allergy testing in 2008 for common environmental allergens. Allergic rhinitis. Office spirometry 07/27/2009 documented FEV1 1.34/53%, FVC 2.12/68.8%, FEV1/FEC 0.63. 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 Original skin testing by Dr Caprice Red, then followed by Dr Ishmael Holter until insurance change. She says neither allergy vaccine or Xolair times one year trial were helpful. No ENT surgery. No recent sinus infections. She denies GERD. Triggers: Season changes, pets, virus infections, house dust, strong odors. Environment: House, no pets, smokers or mold/water issues. 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 Father had emphysema, deceased.  01-25-2014- 54 yoF never smoker Self Referral-asthma and allergies; Previous MR patient. FOLLOWS FOR: had bump in eye-went to eye MD and told to follow up here. Having runny nose today. Took Benadryl due to itching. Symbicort too expensive($80) and Dulera was  $75.  01/16/14 FOLLOWS FOR:  C/O: itching on back and legs and then those areas are getting hives over the past week Prednisone called in 4 days ago for 1 week of hives/ itching. No change of exposure or environment identified. Benadryl helps but sleepy.  Asthma "great" on singulair, rescue inhaler  08/11/14- 53 yoF never smoker Self Referral-asthma and allergies; Previous MR patient ACUTE VISIT: cough-productive-yellow, unable to sleep due to cough, head congestion as well. Tried Delsym OTC. Reports 5 days of acute illness with increasing cough sometimes near syncopal, productive yellow sputum, scratchy throat, no definite fever or adenopathy. Metered inhaler not helping. Stomach okay. 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 Symptoms have resolved by avoiding that product.  ROS-see HPI Constitutional:   No-   weight loss, night sweats, fevers, chills, fatigue, lassitude. HEENT:   No-  headaches, difficulty swallowing, tooth/dental problems, sore throat,       No-  sneezing, itching, ear ache, +nasal congestion, post nasal drip,  CV:  No-   chest pain, orthopnea, PND, swelling in lower extremities, anasarca,                                  dizziness, palpitations Resp: +shortness of breath with exertion or at rest.              +   productive cough,  + non-productive cough,  No- coughing up of blood.              + change in color of mucus.  +wheezing.   Skin: +HPI. GI:  No-   heartburn, indigestion, abdominal pain, nausea, vomiting, GU:  MS:  No-   joint pain or swelling.   Neuro-     nothing unusual Psych:  No- change in mood or affect. No depression or anxiety.  No memory loss.  OBJ- Physical Exam General- Alert, Oriented, Affect-appropriate, Distress- none acute Skin- rash-none, lesions- none, excoriation- none Lymphadenopathy- none Head- atraumatic            Eyes- Gross vision intact, PERRLA, conjunctivae and secretions clear            Ears- Hearing, canals-normal            Nose- +turbinate edema, no-Septal dev, mucus, polyps, erosion, perforation  Throat- Mallampati III , mucosa clear , drainage- none, tonsils- atrophic Neck- flexible , trachea midline, no stridor , thyroid nl, carotid no bruit Chest - symmetrical excursion , unlabored           Heart/CV- RRR , no murmur , no gallop  , no rub, nl s1 s2                           - JVD- none , edema- none, stasis changes- none, varices- none           Lung-  wheeze + wheeze/cough , dullness-none, rub- none           Chest wall-  Abd-  Br/ Gen/ Rectal- Not done, not indicated Extrem- cyanosis- none, clubbing, none, atrophy- none, strength- nl Neuro-  grossly intact to observation

## 2014-12-10 ENCOUNTER — Other Ambulatory Visit: Payer: Self-pay | Admitting: Internal Medicine

## 2014-12-18 ENCOUNTER — Ambulatory Visit: Payer: 59 | Admitting: Gynecology

## 2015-01-26 ENCOUNTER — Encounter: Payer: Self-pay | Admitting: Obstetrics and Gynecology

## 2015-01-26 ENCOUNTER — Ambulatory Visit (INDEPENDENT_AMBULATORY_CARE_PROVIDER_SITE_OTHER): Payer: Commercial Managed Care - HMO | Admitting: Obstetrics and Gynecology

## 2015-01-26 VITALS — BP 118/70 | HR 60 | Resp 16 | Ht 64.25 in | Wt 152.0 lb

## 2015-01-26 DIAGNOSIS — Z0000 Encounter for general adult medical examination without abnormal findings: Secondary | ICD-10-CM | POA: Diagnosis not present

## 2015-01-26 DIAGNOSIS — Z01419 Encounter for gynecological examination (general) (routine) without abnormal findings: Secondary | ICD-10-CM

## 2015-01-26 LAB — POCT URINALYSIS DIPSTICK
Bilirubin, UA: NEGATIVE
Glucose, UA: NEGATIVE
Ketones, UA: NEGATIVE
NITRITE UA: NEGATIVE
Protein, UA: NEGATIVE
Urobilinogen, UA: NEGATIVE
pH, UA: 7

## 2015-01-26 NOTE — Patient Instructions (Signed)

## 2015-01-26 NOTE — Progress Notes (Signed)
54 y.o. G68P2002 Married [place] American female here for annual exam.    Lost 30 pounds.   Menopausal symptoms tolerable.  No bleeding.   PCP:   Dr. [name]  Patient's last menstrual period was 03/30/2012.          Sexually active: Yes.    The current method of family planning is post menopausal status.    Exercising: No.  The patient does not participate in regular exercise at present. Smoker:  no  Health Maintenance: Pap:  12/12/13 Neg. HR HPV:neg History of abnormal Pap:  Yes, ~ 8 years ago . Hx of Colpo - LGSIL in 2010. MMG:  01/06/14 BIRADS1:neg.   Colonoscopy:  08/2012 BMD:   Never  TDaP:  UTD - PCP Screening Labs:  Hb today: PCP, Urine today: WBC=Trace, RBC=Small - Asymptomatic.     reports that she has never smoked. She has never used smokeless tobacco. She reports that she does not drink alcohol or use illicit drugs.  Past Medical History  Diagnosis Date  . Asthma   . Headache(784.0)     tension  . Allergic rhinitis   . Hypertension   . Abnormal Pap smear of cervix     Past Surgical History  Procedure Laterality Date  . Tubal ligation  27 years ago  . Colonoscopy with propofol N/A 10/05/2012    Procedure: COLONOSCOPY WITH PROPOFOL;  Surgeon: Garlan Fair, MD;  Location: [facility];  Service: Endoscopy;  Laterality: N/A;    Current Outpatient Prescriptions  Medication Sig Dispense Refill  . albuterol (PROVENTIL HFA;VENTOLIN HFA) 108 (90 BASE) MCG/ACT inhaler Inhale 2 puffs into the lungs every 6 (six) hours as needed for wheezing or shortness of breath. 1 Inhaler prn  . cetirizine (ZYRTEC) 10 MG tablet Take 10 mg by mouth as needed.     . diphenhydrAMINE (BENADRYL) 25 MG tablet Take 25 mg by mouth at bedtime as needed.    . hydrochlorothiazide (HYDRODIURIL) 25 MG tablet Take 1 tablet by mouth daily.    Marland Kitchen loratadine (CLARITIN) 10 MG tablet Take 10 mg by mouth as needed.     . montelukast (SINGULAIR) 10 MG tablet TAKE 1 TABLET AT BEDTIME 90 tablet 0  .  polyethylene glycol (MIRALAX / GLYCOLAX) packet Take 17 g by mouth daily.    . theophylline (THEODUR) 100 MG 12 hr tablet Take 200 mg by mouth 2 (two) times daily.  0   No current facility-administered medications for this visit.    Family History  Problem Relation Age of Onset  . Emphysema Father   . High Cholesterol Mother   . Osteoporosis Mother   . Asthma Son     exercise induced  . Migraines Daughter   . Lung cancer Father   . Hypertension Mother   . Hypertension Son   . Hypertension Sister     ROS:  Pertinent items are noted in HPI.  Otherwise, a comprehensive ROS was negative.  Exam:   BP 118/70 mmHg  Pulse 60  Resp 16  Ht 5' 4.25" (1.632 m)  Wt 152 lb (68.947 kg)  BMI 25.89 kg/m2  LMP 03/30/2012    General appearance: alert, cooperative and appears stated age Head: Normocephalic, without obvious abnormality, atraumatic Neck: no adenopathy, supple, symmetrical, trachea midline and thyroid normal to inspection and palpation Lungs: clear to auscultation bilaterally Breasts: normal appearance, no masses or tenderness, Inspection negative, No nipple retraction or dimpling, No nipple discharge or bleeding, No axillary or supraclavicular adenopathy Heart: regular rate  and rhythm Abdomen: soft, non-tender; bowel sounds normal; no masses,  no organomegaly Extremities: extremities normal, atraumatic, no cyanosis or edema Skin: Skin color, texture, turgor normal. No rashes or lesions Lymph nodes: Cervical, supraclavicular, and axillary nodes normal. No abnormal inguinal nodes palpated Neurologic: Grossly normal  Pelvic: External genitalia:  no lesions              Urethra:  normal appearing urethra with no masses, tenderness or lesions              Bartholins and Skenes: normal                 Vagina: normal appearing vagina with normal color and discharge, no lesions              Cervix: no lesions              Pap taken: No. Bimanual Exam:  Uterus:  normal size,  contour, position, consistency, mobility, non-tender              Adnexa: normal adnexa and no mass, fullness, tenderness              Rectovaginal: Yes.  .  Confirms.              Anus:  normal sphincter tone, no lesions  Chaperone was present for exam.  Assessment:   Well woman visit with normal exam. Hx LGSIL in 2010.  Plan: Yearly mammogram recommended after age 60.  [facility]. Recommended self breast exam.  Pap and HR HPV as above. Pap next year.  Discussed Calcium, Vitamin D, regular exercise program including cardiovascular and weight bearing exercise. Labs performed.  No..   See orders. Refills given on medications.  No..  See orders. Follow up annually and prn.      After visit summary provided.

## 2015-02-09 ENCOUNTER — Ambulatory Visit: Payer: 59 | Admitting: Internal Medicine

## 2015-02-21 ENCOUNTER — Other Ambulatory Visit (HOSPITAL_COMMUNITY): Payer: Self-pay | Admitting: Internal Medicine

## 2015-02-21 DIAGNOSIS — Z1231 Encounter for screening mammogram for malignant neoplasm of breast: Secondary | ICD-10-CM

## 2015-02-27 ENCOUNTER — Ambulatory Visit (HOSPITAL_COMMUNITY)
Admission: RE | Admit: 2015-02-27 | Discharge: 2015-02-27 | Disposition: A | Discharge status: Home / Self Care | Payer: Commercial Managed Care - HMO | Source: Ambulatory Visit | Attending: Internal Medicine | Admitting: Internal Medicine

## 2015-02-27 DIAGNOSIS — Z1231 Encounter for screening mammogram for malignant neoplasm of breast: Secondary | ICD-10-CM | POA: Diagnosis not present

## 2015-03-12 ENCOUNTER — Other Ambulatory Visit: Payer: Self-pay | Admitting: Internal Medicine

## 2015-03-14 ENCOUNTER — Telehealth: Payer: Self-pay | Admitting: Obstetrics and Gynecology

## 2015-03-14 NOTE — Telephone Encounter (Signed)
Left message regarding upcoming appointment has been canceled and needs to be rescheduled. °

## 2015-05-30 ENCOUNTER — Telehealth: Payer: Self-pay | Admitting: Internal Medicine

## 2015-05-30 MED ORDER — THEOPHYLLINE ER 100 MG PO TB12: 200.0000 mg | ORAL_TABLET | Freq: Two times a day (BID) | ORAL | Status: DC

## 2015-05-30 NOTE — Telephone Encounter (Signed)
Called spoke with pt. She needed refill on her theo. She does 2 BID. I have sent this in. Nothing further needed

## 2015-06-01 ENCOUNTER — Telehealth: Payer: Self-pay | Admitting: Internal Medicine

## 2015-06-01 NOTE — Telephone Encounter (Signed)
Last ov 08/11/14 with CY Next ov 09/13/15 with CY  Spoke with patient and she states she can longer get theophylline (THEODUR) 100 MG 12 hr tablet from her local pharmacy or mail order pharmacy. She states she has not took theophylline in a month and was wondering if CY wanted to change the rx to a different medications. I called and spoke with Sharyn Lull at [facility] on [address] [facility] and she states that they are unable to receive Theophylline due to the manufacture and she is unsure when it will become available.   CY please advise  Allergies  Allergen Reactions  . Shellfish Allergy Shortness Of Breath and Itching  . Sulfonamide Derivatives     REACTION: throat swelling and itching  . Nasonex [Mometasone Furoate] Other (See Comments)    Nose bleeds   Current Outpatient Prescriptions on File Prior to Visit  Medication Sig Dispense Refill  . albuterol (PROVENTIL HFA;VENTOLIN HFA) 108 (90 BASE) MCG/ACT inhaler Inhale 2 puffs into the lungs every 6 (six) hours as needed for wheezing or shortness of breath. 1 Inhaler prn  . cetirizine (ZYRTEC) 10 MG tablet Take 10 mg by mouth as needed.     . diphenhydrAMINE (BENADRYL) 25 MG tablet Take 25 mg by mouth at bedtime as needed.    . hydrochlorothiazide (HYDRODIURIL) 25 MG tablet Take 1 tablet by mouth daily.    Marland Kitchen loratadine (CLARITIN) 10 MG tablet Take 10 mg by mouth as needed.     . montelukast (SINGULAIR) 10 MG tablet TAKE 1 TABLET AT BEDTIME 90 tablet 0  . polyethylene glycol (MIRALAX / GLYCOLAX) packet Take 17 g by mouth daily.    . theophylline (THEODUR) 100 MG 12 hr tablet Take 2 tablets (200 mg total) by mouth 2 (two) times daily. 120 tablet 1   No current facility-administered medications on file prior to visit.

## 2015-06-01 NOTE — Telephone Encounter (Signed)
If she is getting along well without theophylline and she may not need anything added. If she feels she needs extra help, then suggest sample and printed prescription for Breo 100 Ellipta, inhale 1 puff (rinse mouth, once daily with refills when necessary.

## 2015-06-01 NOTE — Telephone Encounter (Signed)
Called spoke with pt. She has been doing fine w/o it. She will call back if she has any change in her breathing, etc for sample of inhaler. Nothing further needed

## 2015-06-01 NOTE — Telephone Encounter (Signed)
LMTCB x1 for pt on named VM

## 2015-06-01 NOTE — Telephone Encounter (Signed)
[phone], pt cb

## 2015-06-08 ENCOUNTER — Other Ambulatory Visit: Payer: Self-pay | Admitting: Internal Medicine

## 2015-08-02 ENCOUNTER — Other Ambulatory Visit: Payer: Self-pay | Admitting: Internal Medicine

## 2015-08-02 DIAGNOSIS — R109 Unspecified abdominal pain: Secondary | ICD-10-CM

## 2015-08-08 ENCOUNTER — Ambulatory Visit
Admission: RE | Admit: 2015-08-08 | Discharge: 2015-08-08 | Disposition: A | Discharge status: Home / Self Care | Payer: Managed Care, Other (non HMO) | Source: Ambulatory Visit | Attending: Internal Medicine | Admitting: Internal Medicine

## 2015-08-08 DIAGNOSIS — R109 Unspecified abdominal pain: Secondary | ICD-10-CM

## 2015-09-13 ENCOUNTER — Encounter: Payer: Self-pay | Admitting: Internal Medicine

## 2015-09-13 ENCOUNTER — Ambulatory Visit (INDEPENDENT_AMBULATORY_CARE_PROVIDER_SITE_OTHER): Payer: Managed Care, Other (non HMO) | Admitting: Internal Medicine

## 2015-09-13 VITALS — BP 124/70 | HR 63 | Ht 64.75 in | Wt 174.2 lb

## 2015-09-13 DIAGNOSIS — J0100 Acute maxillary sinusitis, unspecified: Secondary | ICD-10-CM

## 2015-09-13 DIAGNOSIS — J4520 Mild intermittent asthma, uncomplicated: Secondary | ICD-10-CM

## 2015-09-13 DIAGNOSIS — J302 Other seasonal allergic rhinitis: Secondary | ICD-10-CM

## 2015-09-13 DIAGNOSIS — J309 Allergic rhinitis, unspecified: Secondary | ICD-10-CM

## 2015-09-13 DIAGNOSIS — J3089 Other allergic rhinitis: Secondary | ICD-10-CM

## 2015-09-13 MED ORDER — MONTELUKAST SODIUM 10 MG PO TABS: 10.0000 mg | ORAL_TABLET | Freq: Every day | ORAL | Status: DC

## 2015-09-13 MED ORDER — PHENYLEPHRINE HCL 1 % NA SOLN
3.0000 [drp] | Freq: Once | NASAL | Status: AC
  Administered 2015-09-13: 3 [drp] via NASAL

## 2015-09-13 MED ORDER — AMOXICILLIN 500 MG PO TABS: ORAL_TABLET | ORAL | Status: DC

## 2015-09-13 MED ORDER — METHYLPREDNISOLONE ACETATE 80 MG/ML IJ SUSP
80.0000 mg | Freq: Once | INTRAMUSCULAR | Status: AC
  Administered 2015-09-13: 80 mg via INTRAMUSCULAR

## 2015-09-13 NOTE — Progress Notes (Signed)
10/14/13- 43 yoF never smoker Self Referral-asthma and allergies; Previous MR patient. Last seen here in 2011 for asthma regimen diagnosed age 55 without hospitalization or mechanical ventilation. Positive allergy testing in 2008 for common environmental allergens. Allergic rhinitis. Office spirometry 07/27/2009 documented FEV1 1.34/53%, FVC 2.12/68.8%, FEV1/FEC 0.63. 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 Original skin testing by Dr Caprice Red, then followed by Dr Ishmael Holter until insurance change. She says neither allergy vaccine or Xolair times one year trial were helpful. No ENT surgery. No recent sinus infections. She denies GERD. Triggers: Season changes, pets, virus infections, house dust, strong odors. Environment: House, no pets, smokers or mold/water issues. 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 Father had emphysema, deceased.  Jan 20, 2014- 70 yoF never smoker Self Referral-asthma and allergies; Previous MR patient. FOLLOWS FOR: had bump in eye-went to eye MD and told to follow up here. Having runny nose today. Took Benadryl due to itching. Symbicort too expensive($80) and Dulera was  $75.  01/16/14 FOLLOWS FOR:  C/O: itching on back and legs and then those areas are getting hives over the past week Prednisone called in 4 days ago for 1 week of hives/ itching. No change of exposure or environment identified. Benadryl helps but sleepy.  Asthma "great" on singulair, rescue inhaler  08/11/14- 53 yoF never smoker Self Referral-asthma and allergies; Previous MR patient ACUTE VISIT: cough-productive-yellow, unable to sleep due to cough, head congestion as well. Tried Delsym OTC. Reports 5 days of acute illness with increasing cough sometimes near syncopal, productive yellow sputum, scratchy throat, no definite fever or adenopathy. Metered inhaler not helping. Stomach okay. Previously seen for  hives and itching which she decided were from shrimp residue in a protein shake. Symptoms have resolved by avoiding that product.  09/13/2015-55 year old female never smoker followed for asthma, allergies, food allergy/shrimp FOLLOWS FOR: Increased sinus congestion and painful for past several weeks; breathing has been doing good. 2 weeks of frontal and retronasal pressure discomfort without fever, sore throat, swollen glands. Chest feels fine and ears are okay. Not blowing out much yet. Treating herself with saline nasal spray. Needs refills Singulair. Using saline nasal spray, Nasacort, Zyrtec. Asks prescriptions.  ROS-see HPI Constitutional:   No-   weight loss, night sweats, fevers, chills, fatigue, lassitude. HEENT:   No-  headaches, difficulty swallowing, tooth/dental problems, sore throat,       No-  sneezing, itching, ear ache, +nasal congestion, post nasal drip,  CV:  No-   chest pain, orthopnea, PND, swelling in lower extremities, anasarca,                                                  +   productive cough,  + non-productive cough,  No- coughing up of blood.              + change in color of mucus.  +wheezing.   Skin: +HPI. GI:  No-   heartburn, indigestion, abdominal pain, nausea, vomiting, GU:  MS:  No-   joint pain or swelling.   Neuro-     nothing unusual Psych:  No- change in mood or affect. No depression or anxiety.  No memory loss.  OBJ- Physical Exam General- Alert, Oriented, Affect-appropriate, Distress- none acute Skin- rash-none, lesions- none, excoriation- none Lymphadenopathy- none Head- atraumatic  Eyes- Gross vision intact, PERRLA, conjunctivae and secretions clear            Ears- Hearing, canals-normal            Nose- +turbinate edema, no-Septal dev, mucus, polyps, erosion, perforation             Throat- Mallampati III , mucosa clear , drainage- none, tonsils- atrophic Neck- flexible , trachea midline, no stridor , thyroid nl, carotid no  bruit Chest - symmetrical excursion , unlabored           Heart/CV- RRR , no murmur , no gallop  , no rub, nl s1 s2                           - JVD- none , edema- none, stasis changes- none, varices- none           Lung-  wheeze -none , cough-none , dullness-none, rub- none           Chest wall-  Abd-  Br/ Gen/ Rectal- Not done, not indicated Extrem- cyanosis- none, clubbing, none, atrophy- none, strength- nl Neuro- grossly intact to observation

## 2015-09-13 NOTE — Patient Instructions (Addendum)
Refill script  To be sent for Singulair to your new [facility]  Script printed for amoxacillin antibiotic to take if needed  Neb neo nasal      Dx acute maxillary sinusitis  Depo 80

## 2015-09-13 NOTE — Assessment & Plan Note (Signed)
Nonspecific sinus congestion pain, probably viral versus tree pollen allergy. Plan-nasal nebulizer decongestant, Depo-Medrol, Sudafed, saline rinse. Amoxicillin prescription to hold as discussed.

## 2015-09-13 NOTE — Assessment & Plan Note (Signed)
Uncomplicated without recent exacerbation. Uses rescue inhaler only very occasionally with no sleep disturbance. Plan-refill Singulair

## 2015-09-20 ENCOUNTER — Encounter (HOSPITAL_BASED_OUTPATIENT_CLINIC_OR_DEPARTMENT_OTHER): Payer: Self-pay | Admitting: *Deleted

## 2015-09-20 ENCOUNTER — Emergency Department (HOSPITAL_BASED_OUTPATIENT_CLINIC_OR_DEPARTMENT_OTHER)
Admission: EM | Admit: 2015-09-20 | Discharge: 2015-09-20 | Disposition: A | Discharge status: Home / Self Care | Payer: Managed Care, Other (non HMO) | Attending: Emergency Medicine | Admitting: Emergency Medicine

## 2015-09-20 DIAGNOSIS — Z792 Long term (current) use of antibiotics: Secondary | ICD-10-CM | POA: Diagnosis not present

## 2015-09-20 DIAGNOSIS — J45909 Unspecified asthma, uncomplicated: Secondary | ICD-10-CM | POA: Diagnosis not present

## 2015-09-20 DIAGNOSIS — Z79899 Other long term (current) drug therapy: Secondary | ICD-10-CM | POA: Insufficient documentation

## 2015-09-20 DIAGNOSIS — I10 Essential (primary) hypertension: Secondary | ICD-10-CM | POA: Insufficient documentation

## 2015-09-20 DIAGNOSIS — R1013 Epigastric pain: Secondary | ICD-10-CM | POA: Diagnosis present

## 2015-09-20 DIAGNOSIS — K2970 Gastritis, unspecified, without bleeding: Secondary | ICD-10-CM | POA: Insufficient documentation

## 2015-09-20 LAB — BASIC METABOLIC PANEL
Anion gap: 12 (ref 5–15)
BUN: 11 mg/dL (ref 6–20)
CALCIUM: 9.6 mg/dL (ref 8.9–10.3)
CO2: 29 mmol/L (ref 22–32)
CREATININE: 0.8 mg/dL (ref 0.44–1.00)
Chloride: 99 mmol/L — ABNORMAL LOW (ref 101–111)
GFR calc Af Amer: >60 mL/min (ref >60)
Glucose, Bld: 105 mg/dL — ABNORMAL HIGH (ref 65–99)
Potassium: 3.2 mmol/L — ABNORMAL LOW (ref 3.5–5.1)
SODIUM: 140 mmol/L (ref 135–145)

## 2015-09-20 LAB — CBC WITH DIFFERENTIAL/PLATELET
BASOS PCT: 1 %
Basophils Absolute: 0 10*3/uL (ref 0.0–0.1)
EOS ABS: 0.2 10*3/uL (ref 0.0–0.7)
EOS PCT: 2 %
HCT: 41 % (ref 36.0–46.0)
Hemoglobin: 13.3 g/dL (ref 12.0–15.0)
LYMPHS ABS: 1.8 10*3/uL (ref 0.7–4.0)
Lymphocytes Relative: 23 %
MCH: 28.3 pg (ref 26.0–34.0)
MCHC: 32.4 g/dL (ref 30.0–36.0)
MCV: 87.2 fL (ref 78.0–100.0)
MONOS PCT: 5 %
Monocytes Absolute: 0.4 10*3/uL (ref 0.1–1.0)
Neutro Abs: 5.5 10*3/uL (ref 1.7–7.7)
Neutrophils Relative %: 69 %
PLATELETS: 210 10*3/uL (ref 150–400)
RBC: 4.7 MIL/uL (ref 3.87–5.11)
RDW: 13.6 % (ref 11.5–15.5)
WBC: 7.8 10*3/uL (ref 4.0–10.5)

## 2015-09-20 LAB — URINALYSIS, ROUTINE W REFLEX MICROSCOPIC
Bilirubin Urine: NEGATIVE
GLUCOSE, UA: NEGATIVE mg/dL
Ketones, ur: NEGATIVE mg/dL
Leukocytes, UA: NEGATIVE
Nitrite: NEGATIVE
PROTEIN: NEGATIVE mg/dL
SPECIFIC GRAVITY, URINE: 1.012 (ref 1.005–1.030)
pH: 7.5 (ref 5.0–8.0)

## 2015-09-20 LAB — HEPATIC FUNCTION PANEL
ALK PHOS: 56 U/L (ref 38–126)
ALT: 14 U/L (ref 14–54)
AST: 23 U/L (ref 15–41)
Albumin: 4.5 g/dL (ref 3.5–5.0)
BILIRUBIN INDIRECT: 0.4 mg/dL (ref 0.3–0.9)
Bilirubin, Direct: 0.2 mg/dL (ref 0.1–0.5)
TOTAL PROTEIN: 8 g/dL (ref 6.5–8.1)
Total Bilirubin: 0.6 mg/dL (ref 0.3–1.2)

## 2015-09-20 LAB — URINE MICROSCOPIC-ADD ON: WBC UA: NONE SEEN WBC/hpf (ref 0–5)

## 2015-09-20 LAB — LIPASE, BLOOD: LIPASE: 14 U/L (ref 11–51)

## 2015-09-20 MED ORDER — PANTOPRAZOLE SODIUM 20 MG PO TBEC: 20.0000 mg | DELAYED_RELEASE_TABLET | Freq: Every day | ORAL | Status: DC

## 2015-09-20 MED ORDER — FAMOTIDINE 20 MG PO TABS
20.0000 mg | ORAL_TABLET | Freq: Once | ORAL | Status: AC
  Administered 2015-09-20: 20 mg via ORAL
  Filled 2015-09-20: qty 1

## 2015-09-20 MED ORDER — ONDANSETRON 4 MG PO TBDP: 4.0000 mg | ORAL_TABLET | ORAL | Status: DC | PRN

## 2015-09-20 MED ORDER — GI COCKTAIL ~~LOC~~
30.0000 mL | Freq: Once | ORAL | Status: AC
  Administered 2015-09-20: 30 mL via ORAL
  Filled 2015-09-20: qty 30

## 2015-09-20 NOTE — ED Provider Notes (Signed)
CSN: [id number]     Arrival date & time 09/20/15  1853 History  By signing my name below, I, Altamease Oiler, attest that this documentation has been prepared under the direction and in the presence of Charlesetta Shanks, MD. Electronically Signed: Altamease Oiler, ED Scribe. 09/20/2015. 9:13 PM   Chief Complaint  Patient presents with  . Abdominal Pain   The history is provided by the patient. No language interpreter was used.   NIKE MONTUFAR is a 55 y.o. female with history of GERD who presents to the Emergency Department complaining of gradually worsening, 6/10 in severity, aching, epigastric pain with onset yesterday. The pain started after eating hot wings and does not radiate to the back. OTC gas medication provided insufficient pain relief PTA. Belching and movement exacerbate the pain.  Her last episode of GERD was 15 years ago. Pt denies nausea, vomiting, diarrhea, SOB, dysuria, and LE pain or swelling.   Past Medical History  Diagnosis Date  . Asthma   . Headache(784.0)     tension  . Allergic rhinitis   . Hypertension   . Abnormal Pap smear of cervix    Past Surgical History  Procedure Laterality Date  . Tubal ligation  27 years ago  . Colonoscopy with propofol N/A 10/05/2012    Procedure: COLONOSCOPY WITH PROPOFOL;  Surgeon: Garlan Fair, MD;  Location: [facility];  Service: Endoscopy;  Laterality: N/A;   Family History  Problem Relation Age of Onset  . Emphysema Father   . High Cholesterol Mother   . Osteoporosis Mother   . Asthma Son     exercise induced  . Migraines Daughter   . Lung cancer Father   . Hypertension Mother   . Hypertension Son   . Hypertension Sister    Social History  Substance Use Topics  . Smoking status: Never Smoker   . Smokeless tobacco: Never Used  . Alcohol Use: No   OB History    Gravida Para Term Preterm AB TAB SAB Ectopic Multiple Living   2 2 2       2      Review of Systems  Respiratory: Negative for shortness of  breath.   Cardiovascular: Negative for leg swelling.  Gastrointestinal: Positive for abdominal pain. Negative for nausea, vomiting and diarrhea.  Genitourinary: Negative for dysuria.  Musculoskeletal: Negative for back pain.  All other systems reviewed and are negative.  Allergies  Shellfish allergy; Sulfonamide derivatives; and Nasonex  Home Medications   Prior to Admission medications   Medication Sig Start Date End Date Taking? Authorizing Provider  albuterol (PROVENTIL HFA;VENTOLIN HFA) 108 (90 BASE) MCG/ACT inhaler Inhale 2 puffs into the lungs every 6 (six) hours as needed for wheezing or shortness of breath. 01/02/14   Deneise Lever, MD  amoxicillin (AMOXIL) 500 MG tablet 1 tab, three times daily 09/13/15   Deneise Lever, MD  cetirizine (ZYRTEC) 10 MG tablet Take 10 mg by mouth as needed.     Historical Provider, MD  diphenhydrAMINE (BENADRYL) 25 MG tablet Take 25 mg by mouth at bedtime as needed.    Historical Provider, MD  hydrochlorothiazide (HYDRODIURIL) 25 MG tablet Take 1 tablet by mouth daily. 10/07/13   Historical Provider, MD  montelukast (SINGULAIR) 10 MG tablet Take 1 tablet (10 mg total) by mouth at bedtime. 09/13/15   Deneise Lever, MD  ondansetron (ZOFRAN ODT) 4 MG disintegrating tablet Take 1 tablet (4 mg total) by mouth every 4 (four) hours as needed for  nausea or vomiting. 09/20/15   Charlesetta Shanks, MD  pantoprazole (PROTONIX) 20 MG tablet Take 1 tablet (20 mg total) by mouth daily. 09/20/15   Charlesetta Shanks, MD  polyethylene glycol (MIRALAX / GLYCOLAX) packet Take 17 g by mouth daily.    Historical Provider, MD   BP 140/90 mmHg  Pulse 72  Temp(Src) 98.7 F (37.1 C) (Oral)  Resp 16  Ht 5' 4.5" (1.638 m)  Wt 174 lb (78.926 kg)  BMI 29.42 kg/m2  SpO2 100%  LMP 03/30/2012 Physical Exam  Constitutional: She is oriented to person, place, and time. She appears well-developed and well-nourished. No distress.  HENT:  Head: Normocephalic and atraumatic.  Eyes: EOM  are normal.  Neck: Normal range of motion.  Cardiovascular: Normal rate, regular rhythm and normal heart sounds.   Pulmonary/Chest: Effort normal and breath sounds normal. She has no wheezes. She has no rales. She exhibits no tenderness.  Abdominal: Soft. She exhibits no distension. There is tenderness. There is no rebound and no guarding.  Moderate epigastric TTP Lower abdomen nontender  Musculoskeletal: Normal range of motion.  No edema   Neurological: She is alert and oriented to person, place, and time. She exhibits normal muscle tone. Coordination normal.  Skin: Skin is warm and dry.  Psychiatric: She has a normal mood and affect. Judgment normal.  Nursing note and vitals reviewed.   ED Course  Procedures (including critical care time) DIAGNOSTIC STUDIES: Oxygen Saturation is 100% on RA,  normal by my interpretation.    COORDINATION OF CARE: 9:12 PM Discussed treatment plan which includes lab work, EKG, Pepcid, and GI cocktail with pt at bedside and pt agreed to plan.  Labs Review Labs Reviewed  URINALYSIS, ROUTINE W REFLEX MICROSCOPIC (NOT AT [facility]) - Abnormal; Notable for the following:    Hgb urine dipstick MODERATE (*)    All other components within normal limits  URINE MICROSCOPIC-ADD ON - Abnormal; Notable for the following:    Squamous Epithelial / LPF 0-5 (*)    Bacteria, UA RARE (*)    All other components within normal limits  BASIC METABOLIC PANEL - Abnormal; Notable for the following:    Potassium 3.2 (*)    Chloride 99 (*)    Glucose, Bld 105 (*)    All other components within normal limits  CBC WITH DIFFERENTIAL/PLATELET  HEPATIC FUNCTION PANEL  LIPASE, BLOOD    Imaging Review No results found. I have personally reviewed and evaluated these lab results as part of my medical decision-making.   EKG Interpretation   Date/Time:  Thursday September 20 2015 19:06:36 EDT Ventricular Rate:  66 PR Interval:  182 QRS Duration: 90 QT Interval:  398 QTC  Calculation: 417 R Axis:   72 Text Interpretation:  Normal sinus rhythm Normal ECG agree Confirmed by  Johnney Killian, MD, Jeannie Done [phone]) on 09/20/2015 7:10:56 PM      MDM   Final diagnoses:  Gastritis   Patient experienced epigastric pain that started after eating hot wings. He is mildly reproducible epigastric pain to palpation. Diagnostic workup is negative. At this time I feel this is most consistent with gastritis and will initiate the patient on PPI. She will be instructed to follow-up with her primary provider for response to treatment.  Charlesetta Shanks, MD 09/24/15 1539

## 2015-09-20 NOTE — ED Notes (Addendum)
C/o epigastric pain onset this am,  Denies n/v/d,  No ua sx,  Was seen at [facility] and was told to come here for xray

## 2015-09-20 NOTE — Discharge Instructions (Signed)
Suspected Gastritis, Adult Gastritis is soreness and swelling (inflammation) of the lining of the stomach. Gastritis can develop as a sudden onset (acute) or long-term (chronic) condition. If gastritis is not treated, it can lead to stomach bleeding and ulcers. CAUSES  Gastritis occurs when the stomach lining is weak or damaged. Digestive juices from the stomach then inflame the weakened stomach lining. The stomach lining may be weak or damaged due to viral or bacterial infections. One common bacterial infection is the Helicobacter pylori infection. Gastritis can also result from excessive alcohol consumption, taking certain medicines, or having too much acid in the stomach.  SYMPTOMS  In some cases, there are no symptoms. When symptoms are present, they may include:  Pain or a burning sensation in the upper abdomen.  Nausea.  Vomiting.  An uncomfortable feeling of fullness after eating. DIAGNOSIS  Your caregiver may suspect you have gastritis based on your symptoms and a physical exam. To determine the cause of your gastritis, your caregiver may perform the following:  Blood or stool tests to check for the H pylori bacterium.  Gastroscopy. A thin, flexible tube (endoscope) is passed down the esophagus and into the stomach. The endoscope has a light and camera on the end. Your caregiver uses the endoscope to view the inside of the stomach.  Taking a tissue sample (biopsy) from the stomach to examine under a microscope. TREATMENT  Depending on the cause of your gastritis, medicines may be prescribed. If you have a bacterial infection, such as an H pylori infection, antibiotics may be given. If your gastritis is caused by too much acid in the stomach, H2 blockers or antacids may be given. Your caregiver may recommend that you stop taking aspirin, ibuprofen, or other nonsteroidal anti-inflammatory drugs (NSAIDs). HOME CARE INSTRUCTIONS  Only take over-the-counter or prescription medicines as  directed by your caregiver.  If you were given antibiotic medicines, take them as directed. Finish them even if you start to feel better.  Drink enough fluids to keep your urine clear or pale yellow.  Avoid foods and drinks that make your symptoms worse, such as:  Caffeine or alcoholic drinks.  Chocolate.  Peppermint or mint flavorings.  Garlic and onions.  Spicy foods.  Citrus fruits, such as oranges, lemons, or limes.  Tomato-based foods such as sauce, chili, salsa, and pizza.  Fried and fatty foods.  Eat small, frequent meals instead of large meals. SEEK IMMEDIATE MEDICAL CARE IF:   You have black or dark red stools.  You vomit blood or material that looks like coffee grounds.  You are unable to keep fluids down.  Your abdominal pain gets worse.  You have a fever.  You do not feel better after 1 week.  You have any other questions or concerns. MAKE SURE YOU:  Understand these instructions.  Will watch your condition.  Will get help right away if you are not doing well or get worse.   This information is not intended to replace advice given to you by your health care provider. Make sure you discuss any questions you have with your health care provider.   Document Released: 06/10/2001 Document Revised: 12/16/2011 Document Reviewed: 07/30/2011 Elsevier Interactive Patient Education [employer].

## 2015-09-20 NOTE — ED Notes (Addendum)
She was seen at [facility] walk in clinic today for epigastric/abdominal pain. She was told to come here due to their xray machine not working.

## 2015-09-24 ENCOUNTER — Other Ambulatory Visit: Payer: Self-pay | Admitting: Internal Medicine

## 2015-09-24 DIAGNOSIS — R1310 Dysphagia, unspecified: Secondary | ICD-10-CM

## 2015-09-27 ENCOUNTER — Ambulatory Visit
Admission: RE | Admit: 2015-09-27 | Discharge: 2015-09-27 | Disposition: A | Discharge status: Home / Self Care | Payer: Managed Care, Other (non HMO) | Source: Ambulatory Visit | Attending: Internal Medicine | Admitting: Internal Medicine

## 2015-09-27 DIAGNOSIS — R1310 Dysphagia, unspecified: Secondary | ICD-10-CM

## 2015-10-31 ENCOUNTER — Other Ambulatory Visit: Payer: Self-pay | Admitting: Gastroenterology

## 2015-11-09 ENCOUNTER — Encounter (HOSPITAL_COMMUNITY): Payer: Self-pay | Admitting: *Deleted

## 2015-11-18 NOTE — Anesthesia Preprocedure Evaluation (Addendum)
Anesthesia Evaluation  Patient identified by MRN, date of birth, ID band Patient awake    Reviewed: Allergy & Precautions, H&P , NPO status , Patient's Chart, lab work & pertinent test results  Airway Mallampati: II  TM Distance: >3 FB Neck ROM: Full    Dental no notable dental hx. (+) Dental Advisory Given, Teeth Intact   Pulmonary asthma ,    Pulmonary exam normal breath sounds clear to auscultation       Cardiovascular hypertension, Pt. on medications negative cardio ROS Normal cardiovascular exam Rhythm:Regular Rate:Normal     Neuro/Psych negative neurological ROS  negative psych ROS   GI/Hepatic negative GI ROS, Neg liver ROS,   Endo/Other  negative endocrine ROS  Renal/GU negative Renal ROS  negative genitourinary   Musculoskeletal negative musculoskeletal ROS (+)   Abdominal   Peds negative pediatric ROS (+)  Hematology negative hematology ROS (+)   Anesthesia Other Findings   Reproductive/Obstetrics negative OB ROS                            Anesthesia Physical Anesthesia Plan  ASA: III  Anesthesia Plan: MAC   Post-op Pain Management:    Induction:   Airway Management Planned:   Additional Equipment:   Intra-op Plan:   Post-operative Plan:   Informed Consent:   Plan Discussed with:   Anesthesia Plan Comments:         Anesthesia Quick Evaluation

## 2015-11-19 ENCOUNTER — Encounter (HOSPITAL_COMMUNITY)
Admission: RE | Disposition: A | Discharge status: Home / Self Care | Payer: Self-pay | Source: Ambulatory Visit | Attending: Gastroenterology

## 2015-11-19 ENCOUNTER — Ambulatory Visit (HOSPITAL_COMMUNITY): Payer: Managed Care, Other (non HMO) | Admitting: Anesthesiology

## 2015-11-19 ENCOUNTER — Ambulatory Visit (HOSPITAL_COMMUNITY)
Admission: RE | Admit: 2015-11-19 | Discharge: 2015-11-19 | Disposition: A | Discharge status: Home / Self Care | Payer: Managed Care, Other (non HMO) | Source: Ambulatory Visit | Attending: Gastroenterology | Admitting: Gastroenterology

## 2015-11-19 ENCOUNTER — Encounter (HOSPITAL_COMMUNITY): Payer: Self-pay | Admitting: *Deleted

## 2015-11-19 DIAGNOSIS — I10 Essential (primary) hypertension: Secondary | ICD-10-CM | POA: Diagnosis not present

## 2015-11-19 DIAGNOSIS — R1310 Dysphagia, unspecified: Secondary | ICD-10-CM | POA: Diagnosis present

## 2015-11-19 DIAGNOSIS — K222 Esophageal obstruction: Secondary | ICD-10-CM | POA: Insufficient documentation

## 2015-11-19 HISTORY — PX: BALLOON DILATION: SHX5330

## 2015-11-19 HISTORY — PX: ESOPHAGOGASTRODUODENOSCOPY (EGD) WITH PROPOFOL: SHX5813

## 2015-11-19 HISTORY — DX: Gastro-esophageal reflux disease without esophagitis: K21.9

## 2015-11-19 SURGERY — ESOPHAGOGASTRODUODENOSCOPY (EGD) WITH PROPOFOL
Anesthesia: Monitor Anesthesia Care

## 2015-11-19 MED ORDER — PROPOFOL 500 MG/50ML IV EMUL
INTRAVENOUS | Status: DC | PRN
  Administered 2015-11-19: 100 ug/kg/min via INTRAVENOUS

## 2015-11-19 MED ORDER — PROPOFOL 10 MG/ML IV BOLUS
INTRAVENOUS | Status: DC | PRN
  Administered 2015-11-19: 20 mg via INTRAVENOUS
  Administered 2015-11-19: 50 mg via INTRAVENOUS

## 2015-11-19 MED ORDER — LACTATED RINGERS IV SOLN
INTRAVENOUS | Status: DC
  Administered 2015-11-19: 1000 mL via INTRAVENOUS

## 2015-11-19 MED ORDER — PROPOFOL 10 MG/ML IV BOLUS
INTRAVENOUS | Status: AC
Start: 2015-11-19 — End: 2015-11-19
  Filled 2015-11-19: qty 20

## 2015-11-19 MED ORDER — SODIUM CHLORIDE 0.9 % IV SOLN: INTRAVENOUS | Status: DC

## 2015-11-19 SURGICAL SUPPLY — 15 items

## 2015-11-19 NOTE — Transfer of Care (Signed)
Immediate Anesthesia Transfer of Care Note  Patient: Victoria [name]  Procedure(s) Performed: Procedure(s): ESOPHAGOGASTRODUODENOSCOPY (EGD) WITH PROPOFOL (N/A) BALLOON DILATION (N/A)  Patient Location: PACU  Anesthesia Type:MAC  Level of Consciousness:  sedated, patient cooperative and responds to stimulation  Airway & Oxygen Therapy:Patient Spontanous Breathing and Patient connected to face mask oxgen  Post-op Assessment:  Report given to PACU RN and Post -op Vital signs reviewed and stable  Post vital signs:  Reviewed and stable  Last Vitals:  Filed Vitals:   11/19/15 0641  BP: 110/78  Pulse: 55  Temp: 36.8 C  Resp: 11    Complications: No apparent anesthesia complications

## 2015-11-19 NOTE — Anesthesia Postprocedure Evaluation (Signed)
Anesthesia Post Note  Patient: Victoria [name]  Procedure(s) Performed: Procedure(s) (LRB): ESOPHAGOGASTRODUODENOSCOPY (EGD) WITH PROPOFOL (N/A) BALLOON DILATION (N/A)  Patient location during evaluation: PACU Anesthesia Type: MAC Level of consciousness: awake and alert Pain management: pain level controlled Vital Signs Assessment: post-procedure vital signs reviewed and stable Respiratory status: spontaneous breathing, nonlabored ventilation, respiratory function stable and patient connected to nasal cannula oxygen Cardiovascular status: blood pressure returned to baseline and stable Postop Assessment: no signs of nausea or vomiting Anesthetic complications: no    Last Vitals:  Filed Vitals:   11/19/15 0800 11/19/15 0810  BP: 140/97 146/93  Pulse: 63 60  Temp:    Resp: 21 17    Last Pain: There were no vitals filed for this visit.               Breyon Sigg L

## 2015-11-19 NOTE — Discharge Instructions (Signed)

## 2015-11-19 NOTE — Op Note (Signed)
[facility] Patient Name: Victoria [name] Procedure Date: 11/19/2015 MRN: [id number] Attending MD: Garlan Fair , MD Date of Birth: 03-11-1961 CSN: [id number] Age: 55 Admit Type: Outpatient Procedure:                Upper GI endoscopy Indications:              Dysphagia Providers:                Garlan Fair, MD, Debi Mays, RN, Hilma Favors, RN, Despina Pole, Technician, Delena Bali, CRNA Referring MD:              Medicines:                Propofol per Anesthesia Complications:            No immediate complications. Estimated Blood Loss:     Estimated blood loss was minimal. Procedure:                Pre-Anesthesia Assessment:                           - Prior to the procedure, a History and Physical                            was performed, and patient medications and                            allergies were reviewed. The patient's tolerance of                            previous anesthesia was also reviewed. The risks                            and benefits of the procedure and the sedation                            options and risks were discussed with the patient.                            All questions were answered, and informed consent                            was obtained. Prior Anticoagulants: The patient has                            taken no previous anticoagulant or antiplatelet                            agents. ASA Grade Assessment: II - A patient with  mild systemic disease. After reviewing the risks                            and benefits, the patient was deemed in                            satisfactory condition to undergo the procedure.                           After obtaining informed consent, the endoscope was                            passed under direct vision. Throughout the                            procedure, the patient's blood pressure,  pulse, and                            oxygen saturations were monitored continuously. The                            was introduced through the mouth, and advanced to                            the second part of duodenum. The upper GI endoscopy                            was accomplished without difficulty. The patient                            tolerated the procedure well. Scope In: Scope Out: Findings:      The Z-line was regular and was found 36 cm from the incisors.      One mild benign-appearing, intrinsic stenosis was found 36 cm from the       incisors. This measured less than one cm (in length) and was traversed.       A TTS dilator was passed through the scope. Dilation with a 15-16.5-18       mm balloon dilator was performed to 16.5 mm. The dilation site was       examined and showed complete resolution of luminal narrowing. Estimated       blood loss was minimal. The tissue paper appearing lined esophageal       mucosa was biopsied to look for eosinophilic esophagitis (EoE)      The entire examined stomach was normal.      The examined duodenum was normal. Impression:               - Z-line regular, 36 cm from the incisors.                           - Benign-appearing esophageal stenosis. Dilated.                           - Normal stomach.                           -  Normal examined duodenum.                           - No specimens collected. Moderate Sedation:      N/A- Per Anesthesia Care Recommendation:           - Patient has a contact number available for                            emergencies. The signs and symptoms of potential                            delayed complications were discussed with the                            patient. Return to normal activities tomorrow.                            Written discharge instructions were provided to the                            patient.                           - Await pathology results to look for EoE.                            - Resume previous diet.                           - Continue present medications. Procedure Code(s):        --- Professional ---                           [phone], Esophagogastroduodenoscopy, flexible,                            transoral; with transendoscopic balloon dilation of                            esophagus (less than 30 mm diameter) Diagnosis Code(s):        --- Professional ---                           K22.2, Esophageal obstruction                           R13.10, Dysphagia, unspecified CPT copyright 2016 American Medical Association. All rights reserved. The codes documented in this report are preliminary and upon coder review may  be revised to meet current compliance requirements. Earle Gell, MD Garlan Fair, MD 11/19/2015 7:52:28 AM This report has been signed electronically. Number of Addenda: 0

## 2015-11-19 NOTE — H&P (Signed)
  Problem: Esophageal dysphagia. 09/27/2015 barium esophagram showed distal esophageal narrowing. 2017 abdominal ultrasound showed a fatty appearing liver. 10/05/2012 normal screening colonoscopy was performed.  History: The patient is a 55 year old female born Mar 21, 1961. She has intermittent heartburn and esophageal dysphagia. Her barium esophagram showed distal esophageal narrowing consistent with a benign distal esophageal stricture.  The patient is scheduled to undergo diagnostic esophagogastroduodenoscopy with benign distal esophageal stricture dilation.  Past medical history: Bilateral bunion surgery. Bilateral tubal ligation. Asthma. Gastroesophageal reflux. Hypertension.  Allergies: Shellfish and Septra cause anaphylaxis  Exam: The patient is alert and lying comfortably on the endoscopy stretcher. Abdomen is soft and nontender to palpation. Lungs are clear to auscultation. Cardiac exam reveals a regular rhythm.  Plan: Proceed with diagnostic esophagogastroduodenoscopy with benign distal esophageal stricture dilation.

## 2015-11-21 ENCOUNTER — Encounter (HOSPITAL_COMMUNITY): Payer: Self-pay | Admitting: Gastroenterology

## 2016-02-07 ENCOUNTER — Ambulatory Visit: Payer: Commercial Managed Care - HMO | Admitting: Obstetrics and Gynecology

## 2016-04-09 ENCOUNTER — Encounter: Payer: Self-pay | Admitting: Obstetrics and Gynecology

## 2016-04-09 ENCOUNTER — Ambulatory Visit (INDEPENDENT_AMBULATORY_CARE_PROVIDER_SITE_OTHER): Payer: Managed Care, Other (non HMO) | Admitting: Obstetrics and Gynecology

## 2016-04-09 VITALS — BP 102/70 | HR 76 | Resp 16 | Ht 64.25 in | Wt 180.0 lb

## 2016-04-09 DIAGNOSIS — Z01419 Encounter for gynecological examination (general) (routine) without abnormal findings: Secondary | ICD-10-CM

## 2016-04-09 DIAGNOSIS — R3129 Other microscopic hematuria: Secondary | ICD-10-CM | POA: Diagnosis not present

## 2016-04-09 DIAGNOSIS — Z0000 Encounter for general adult medical examination without abnormal findings: Secondary | ICD-10-CM

## 2016-04-09 DIAGNOSIS — K5900 Constipation, unspecified: Secondary | ICD-10-CM

## 2016-04-09 LAB — POCT URINALYSIS DIPSTICK
BILIRUBIN UA: NEGATIVE
Glucose, UA: NEGATIVE
KETONES UA: NEGATIVE
Leukocytes, UA: NEGATIVE
NITRITE UA: NEGATIVE
PH UA: 5
Protein, UA: NEGATIVE
Urobilinogen, UA: NEGATIVE

## 2016-04-09 NOTE — Progress Notes (Signed)
55 y.o. G42P2002 Married [place] American female here for annual exam.    Patient with bloating, constipation, and weight gain.  Takes Miralax and a stool softener.  Lactose intolerance. Had endoscopy this spring and dx was GERD.  Taking Protonix.   Ate something new and had a reaction with hives. Wants to have skin testing again.  Sees Dr. Keturah Barre.   Does labs with PCP.   Urine today with moderate RBCs.  No symptoms.  [place] from the beach for vacation.  PCP:   Seward Carol   Patient's last menstrual period was 03/30/2012.           Sexually active: Yes.    The current method of family planning is post menopausal status.    Exercising: No.  The patient does not participate in regular exercise at present. Smoker:  no  Health Maintenance: Pap:  12/12/13 Neg. HR HPV:neg  History of abnormal Pap:  Yes.  Hx LGSIL in 2010. MMG:  02/28/15 BIRADS1:neg  Colonoscopy:  08/2012 f/u 10 years  BMD:   never TDaP:  UTD with PCP HIV: Unsure Hep C: Unsure  Screening Labs:  Hb today: PCP, Urine today: RBC=Mod.     reports that she has never smoked. She has never used smokeless tobacco. She reports that she does not drink alcohol or use drugs.  Past Medical History:  Diagnosis Date  . Abnormal Pap smear of cervix   . Allergic rhinitis   . Asthma   . GERD (gastroesophageal reflux disease)   . Headache(784.0)    tension- not an issue  . Hypertension     Past Surgical History:  Procedure Laterality Date  . BALLOON DILATION N/A 11/19/2015   Procedure: BALLOON DILATION;  Surgeon: Garlan Fair, MD;  Location: [facility];  Service: Endoscopy;  Laterality: N/A;  . COLONOSCOPY WITH PROPOFOL N/A 10/05/2012   Procedure: COLONOSCOPY WITH PROPOFOL;  Surgeon: Garlan Fair, MD;  Location: [facility];  Service: Endoscopy;  Laterality: N/A;  . ESOPHAGOGASTRODUODENOSCOPY (EGD) WITH PROPOFOL N/A 11/19/2015   Procedure: ESOPHAGOGASTRODUODENOSCOPY (EGD) WITH PROPOFOL;  Surgeon: Garlan Fair, MD;  Location: [facility];  Service: Endoscopy;  Laterality: N/A;  . FOOT SURGERY Bilateral    bunionectomy- bilateral  . TUBAL LIGATION  27 years ago    Current Outpatient Prescriptions  Medication Sig Dispense Refill  . albuterol (PROVENTIL HFA;VENTOLIN HFA) 108 (90 BASE) MCG/ACT inhaler Inhale 2 puffs into the lungs every 6 (six) hours as needed for wheezing or shortness of breath. 1 Inhaler prn  . cetirizine (ZYRTEC) 10 MG tablet Take 10 mg by mouth as needed for allergies.     . diphenhydrAMINE (BENADRYL) 25 MG tablet Take 25 mg by mouth at bedtime as needed for allergies or sleep.     Marland Kitchen docusate sodium (STOOL SOFTENER) 100 MG capsule Take 100 mg by mouth daily.    . fluticasone (FLONASE) 50 MCG/ACT nasal spray Place 2 sprays into both nostrils daily as needed for allergies.    . hydrochlorothiazide (HYDRODIURIL) 25 MG tablet Take 1 tablet by mouth daily.    . montelukast (SINGULAIR) 10 MG tablet Take 1 tablet (10 mg total) by mouth at bedtime. 90 tablet 3  . pantoprazole (PROTONIX) 20 MG tablet Take 1 tablet (20 mg total) by mouth daily. 30 tablet 0  . polyethylene glycol (MIRALAX / GLYCOLAX) packet Take 17 g by mouth daily.    . pseudoephedrine (SUDAFED) 30 MG tablet Take 30 mg by mouth every 4 (four)  hours as needed for congestion.     No current facility-administered medications for this visit.     Family History  Problem Relation Age of Onset  . Emphysema Father   . Lung cancer Father   . High Cholesterol Mother   . Osteoporosis Mother   . Hypertension Mother   . Asthma Son     exercise induced  . Migraines Daughter   . Hypertension Son   . Hypertension Sister     ROS:  Pertinent items are noted in HPI.  Otherwise, a comprehensive ROS was negative.  Exam:   BP 102/70 (BP Location: Right Arm, Patient Position: Sitting, Cuff Size: Normal)   Pulse 76   Resp 16   Ht 5' 4.25" (1.632 m)   Wt 180 lb (81.6 kg)   LMP 03/30/2012   BMI 30.66 kg/m     General  appearance: alert, cooperative and appears stated age Head: Normocephalic, without obvious abnormality, atraumatic Neck: no adenopathy, supple, symmetrical, trachea midline and thyroid normal to inspection and palpation Lungs: clear to auscultation bilaterally Breasts: normal appearance, no masses or tenderness, No nipple retraction or dimpling, No nipple discharge or bleeding, No axillary or supraclavicular adenopathy Heart: regular rate and rhythm Abdomen: soft, non-tender; no masses, no organomegaly Extremities: extremities normal, atraumatic, no cyanosis or edema Skin: Skin color, texture, turgor normal. No rashes or lesions Lymph nodes: Cervical, supraclavicular, and axillary nodes normal. No abnormal inguinal nodes palpated Neurologic: Grossly normal  Pelvic: External genitalia:  no lesions              Urethra:  normal appearing urethra with no masses, tenderness or lesions              Bartholins and Skenes: normal                 Vagina: normal appearing vagina with normal color and discharge, no lesions              Cervix: no lesions              Pap taken: No. Bimanual Exam:  Uterus:  normal size, contour, position, consistency, mobility, non-tender              Adnexa: no mass, fullness, tenderness              Rectal exam: Yes.  .  Confirms.              Anus:  normal sphincter tone, no lesions  Chaperone was present for exam.  Assessment:   Well woman visit with normal exam. Hx LGSIL in 2010.  Microscopic hematuria.  Constipation.  Weight gain.   Plan: Yearly mammogram recommended after age 85.  She will schedule. Recommended self breast exam.  Pap and HR HPV as above.  Will do next year.  I reviewed pap guidelines with patient.  Discussed Calcium, Vitamin D, regular exercise program including cardiovascular and weight bearing exercise. Discussed dietary change and physical activity to treat achieve weight loss and treat constipation.  Urine micro and culture.   To urology if moderate or greater hematuria confirmed and no infection noted. Follow up annually and prn.      After visit summary provided.

## 2016-04-09 NOTE — Patient Instructions (Signed)

## 2016-04-10 LAB — URINALYSIS, MICROSCOPIC ONLY
Bacteria, UA: NONE SEEN [HPF]
CASTS: NONE SEEN [LPF]
Crystals: NONE SEEN [HPF]
SQUAMOUS EPITHELIAL / LPF: NONE SEEN [HPF] (ref <=5)
WBC UA: NONE SEEN WBC/HPF (ref <=5)
Yeast: NONE SEEN [HPF]

## 2016-04-10 LAB — URINE CULTURE

## 2016-04-14 ENCOUNTER — Telehealth: Payer: Self-pay | Admitting: Internal Medicine

## 2016-04-14 NOTE — Telephone Encounter (Signed)
Patient states that she has been having itching episodes, breaking out with hives.  Wants to know if she is allergic to something else.  Wants to have a skin test done.  Wants to know if Dr. Annamaria Boots will do it for her. She is taking more benadryl than she has ever taken.    Dr. Annamaria Boots, please advise.

## 2016-04-15 ENCOUNTER — Other Ambulatory Visit: Payer: Self-pay | Admitting: Internal Medicine

## 2016-04-15 DIAGNOSIS — Z1231 Encounter for screening mammogram for malignant neoplasm of breast: Secondary | ICD-10-CM

## 2016-04-15 NOTE — Telephone Encounter (Signed)
Because I am slowing down, the allergy lab here will be closing, so we aren't doing any more skin testing. She can go to one of the other allergy offices in town for evaluation.

## 2016-04-15 NOTE — Telephone Encounter (Signed)
Spoke with pt. She is aware of CY's response. Nothing further was needed. 

## 2016-04-29 ENCOUNTER — Ambulatory Visit
Admission: RE | Admit: 2016-04-29 | Discharge: 2016-04-29 | Disposition: A | Discharge status: Home / Self Care | Payer: Managed Care, Other (non HMO) | Source: Ambulatory Visit | Attending: Internal Medicine | Admitting: Internal Medicine

## 2016-04-29 DIAGNOSIS — Z1231 Encounter for screening mammogram for malignant neoplasm of breast: Secondary | ICD-10-CM

## 2016-04-30 ENCOUNTER — Ambulatory Visit (INDEPENDENT_AMBULATORY_CARE_PROVIDER_SITE_OTHER): Payer: Managed Care, Other (non HMO) | Admitting: Allergy & Immunology

## 2016-04-30 ENCOUNTER — Encounter: Payer: Self-pay | Admitting: Allergy & Immunology

## 2016-04-30 VITALS — BP 118/84 | HR 50 | Temp 98.5°F | Resp 16 | Ht 64.0 in | Wt 180.8 lb

## 2016-04-30 DIAGNOSIS — L508 Other urticaria: Secondary | ICD-10-CM | POA: Diagnosis not present

## 2016-04-30 DIAGNOSIS — T781XXD Other adverse food reactions, not elsewhere classified, subsequent encounter: Secondary | ICD-10-CM | POA: Diagnosis not present

## 2016-04-30 DIAGNOSIS — J302 Other seasonal allergic rhinitis: Secondary | ICD-10-CM | POA: Diagnosis not present

## 2016-04-30 DIAGNOSIS — J4530 Mild persistent asthma, uncomplicated: Secondary | ICD-10-CM | POA: Diagnosis not present

## 2016-04-30 MED ORDER — EPINEPHRINE 0.3 MG/0.3ML IJ SOAJ: 0.3000 mg | Freq: Once | INTRAMUSCULAR | 4 refills | Status: AC

## 2016-04-30 MED ORDER — FLUTICASONE PROPIONATE 50 MCG/ACT NA SUSP: 2.0000 | Freq: Every day | NASAL | 5 refills | Status: DC | PRN

## 2016-04-30 MED ORDER — MONTELUKAST SODIUM 10 MG PO TABS: 10.0000 mg | ORAL_TABLET | Freq: Every day | ORAL | 5 refills | Status: DC

## 2016-04-30 MED ORDER — ALBUTEROL SULFATE 108 (90 BASE) MCG/ACT IN AEPB: 4.0000 | INHALATION_SPRAY | RESPIRATORY_TRACT | 1 refills | Status: DC | PRN

## 2016-04-30 NOTE — Patient Instructions (Addendum)
1. Mild persistent asthma, uncomplicated - Continue with Singulair 10mg  daily. - Use ProAir 4 puffs every 4-6 hours as needed for coughing/wheezing. - We will change you to ProAir RespiClick which does not need a spacer. - We will hold off on a daily inhaled medication at this time. - You would qualify for a monthly injectable medication (Nucala) since you have evidence of high eosinophils. - But since you are not having symptoms at this time, we will hold off.   2. Chronic allergic rhinitis - Testing was positive to grasses, weeds, trees, molds, dust mite, cat, and cockroach - I recommend starting a daily nasal steroid spray such as Flonase, up to 2 sprays per nostril once daily. - Use an over-the-counter antihistamine as needed for breakthrough symptoms. - We could consider allergy shots in the future, but will hold off for now. - Avoidance measures discussed below.  3. Adverse food reaction, subsequent encounter - Testing was positive to shellfish mix. - Avoid shellfish. - Prescription for AuviQ sent in. - They should call you in 24 hours to confirm your address  4. Return in about 3 months (around 07/31/2016).  Please inform us of any Emergency Department visits, hospitalizations, or changes in symptoms. Call us before going to the ED for breathing or allergy symptoms since we might be able to fit you in for a sick visit. Feel free to contact us anytime with any questions, problems, or concerns.  It was a pleasure to meet you today!   Websites that have reliable patient information: 1. American Academy of Asthma, Allergy, and Immunology: www.aaaai.org 2. Food Allergy Research and Education (FARE): foodallergy.org 3. Mothers of Asthmatics: http://www.asthmacommunitynetwork.org 4. American College of Allergy, Asthma, and Immunology: www.acaai.org  Reducing Pollen Exposure  The American Academy of Allergy, Asthma and Immunology suggests the following steps to reduce your exposure to  pollen during allergy seasons.    1. Do not hang sheets or clothing out to dry; pollen may collect on these items. 2. Do not mow lawns or spend time around freshly cut grass; mowing stirs up pollen. 3. Keep windows closed at night.  Keep car windows closed while driving. 4. Minimize morning activities outdoors, a time when pollen counts are usually at their highest. 5. Stay indoors as much as possible when pollen counts or humidity is high and on windy days when pollen tends to remain in the air longer. 6. Use air conditioning when possible.  Many air conditioners have filters that trap the pollen spores. 7. Use a HEPA room air filter to remove pollen form the indoor air you breathe.  Control of Mold Allergen  Mold and fungi can grow on a variety of surfaces provided certain temperature and moisture conditions exist.  Outdoor molds grow on plants, decaying vegetation and soil.  The major outdoor mold, Alternaria and Cladosporium, are found in very high numbers during hot and dry conditions.  Generally, a late Summer - Fall peak is seen for common outdoor fungal spores.  Rain will temporarily lower outdoor mold spore count, but counts rise rapidly when the rainy period ends.  The most important indoor molds are Aspergillus and Penicillium.  Dark, humid and poorly ventilated basements are ideal sites for mold growth.  The next most common sites of mold growth are the bathroom and the kitchen.  Outdoor [employer] 1. Use air conditioning and keep windows closed 2. Avoid exposure to decaying vegetation. 3. Avoid leaf raking. 4. Avoid grain handling. 5. Consider wearing a face mask  if working in moldy areas.  Indoor Mold Control 1. Maintain humidity below 50%. 2. Clean washable surfaces with 5% bleach solution. 3. Remove sources e.g. contaminated carpets.  Control of House Dust Mite Allergen    House dust mites play a major role in allergic asthma and rhinitis.  They occur in environments  with high humidity wherever human skin, the food for dust mites is found. High levels have been detected in dust obtained from mattresses, pillows, carpets, upholstered furniture, bed covers, clothes and soft toys.  The principal allergen of the house dust mite is found in its feces.  A gram of dust may contain 1,000 mites and 250,000 fecal particles.  Mite antigen is easily measured in the air during house cleaning activities.    1. Encase mattresses, including the box spring, and pillow, in an air tight cover.  Seal the zipper end of the encased mattresses with wide adhesive tape. 2. Wash the bedding in water of 130 degrees Farenheit weekly.  Avoid cotton comforters/quilts and flannel bedding: the most ideal bed covering is the dacron comforter. 3. Remove all upholstered furniture from the bedroom. 4. Remove carpets, carpet padding, rugs, and non-washable window drapes from the bedroom.  Wash drapes weekly or use plastic window coverings. 5. Remove all non-washable stuffed toys from the bedroom.  Wash stuffed toys weekly. 6. Have the room cleaned frequently with a vacuum cleaner and a damp dust-mop.  The patient should not be in a room which is being cleaned and should wait 1 hour after cleaning before going into the room. 7. Close and seal all heating outlets in the bedroom.  Otherwise, the room will become filled with dust-laden air.  An electric heater can be used to heat the room. 8. Reduce indoor humidity to less than 50%.  Do not use a humidifier.  Control of Cockroach Allergen  Cockroach allergen has been identified as an important cause of acute attacks of asthma, especially in urban settings.  There are fifty-five species of cockroach that exist in the [place], however only three, the [place], [place] species produce allergen that can affect patients with Asthma.  Allergens can be obtained from fecal particles, egg casings and secretions from cockroaches.    1. Remove  food sources. 2. Reduce access to water. 3. Seal access and entry points. 4. Spray runways with 0.5-1% Diazinon or Chlorpyrifos 5. Blow boric acid power under stoves and refrigerator. 6. Place bait stations (hydramethylnon) at feeding sites.

## 2016-04-30 NOTE — Progress Notes (Addendum)
NEW PATIENT  Date of Service/Encounter:  04/30/16   Assessment:   Mild persistent asthma, uncomplicated  Chronic seasonal allergic rhinitis due to fungal spores  Adverse food reaction, subsequent encounter  Chronic urticaria    Asthma Reportables:  Severity: mild persistent  Risk: low Control: not well controlled  Seasonal Influenza Vaccine: yes   The CDC recommends patients with persistent asthma between the ages of 19-64 years receive PPSV-23 vaccination, if this patient qualifies, has he/she received 1 dose of PPSV-23 yet? No    Plan/Recommendations:   1. Mild persistent asthma, uncomplicated with history of eosinophilia - Continue with Singulair 35m daily. - Use ProAir 4 puffs every 4-6 hours as needed for coughing/wheezing. - We will change you to ProAir RespiClick which does not need a spacer. - We will hold off on a daily inhaled medication at this time. - [name] qualify for a monthly injectable medication (Nucala) since she has evidence of high eosinophils (AEC 200 in March 2017). - But since Victoria [name] not having symptoms at this time, we will hold off.   2. Chronic allergic rhinitis - Testing was positive to grasses, weeds, trees, molds, dust mite, cat, and cockroach - I recommend starting a daily nasal steroid spray such as Flonase, up to 2 sprays per nostril once daily. - Use an over-the-counter antihistamine as needed for breakthrough symptoms. - We could consider allergy shots in the future, but will hold off for now. - Avoidance measures discussed below.  3. Adverse food reaction, subsequent encounter - Testing was positive to shellfish mix. - Avoid shellfish. - We will get sIgE levels to lemon and mushroom.  - Prescription for [place]ent in. - They should call you in 24 hours to confirm your address.  4. Chronic urticaria - I recommended suppressive dosing of antihistamines. - With the history of abdominal pain, one etiology  of her hives could be H pylor. - We will send antibodies for H pylori.  - Patient is not intereste  5. Return in about 3 months (around 07/31/2016).   Subjective:   Victoria MURLEYis a 55y.o. female presenting today for evaluation of  Chief Complaint  Patient presents with  . Allergy Testing    Foods  .  Victoria ROEDERhas a history of the following: Patient Active Problem List   Diagnosis Date Noted  . Asthmatic bronchitis with exacerbation 08/11/2014  . Allergic urticaria 04/25/2014  . CANDIDIASIS, ORAL 01/08/2010  . ENLARGEMENT OF LYMPH NODES 10/05/2009  . Seasonal and perennial allergic rhinitis 10/05/2007  . Asthma, mild intermittent, well-controlled 10/05/2007    History obtained from: chart review and patient.  Victoria Langwas referred by PKandice Hams MD.     Victoria [name] a 55y.o. female presenting to re-establish care. She was previously seen by Dr. HIshmael Holterbut her insurance changed. So she started seeing Dr. YAnnamaria Bootsfor her asthma and allergies. However, as Dr. YAnnamaria Bootsis now retiring, she is coming back here to be managed by our practice again. Her last visit with [place] in October 2014. At that time, she was on Dulera 200/5 two puffs twice daily, Nasonex, Patanase, Singulair, cetirizine, and albuterol as needed.   Victoria [name] first diagnosed with ashtma when she was around the age of 89 At that time, she had an episode of "anaphylaxis" when she was 14 and shortly thereafter she was diagnosed with asthma and environmental allergies. Her asthma has been under fairly good control for  the last few years. Currently she is on Singulair 24m daily as her controller medication. She has a history of abnormal spirometries that are never reversible. She was on theophylline for around six months but did not feel an improvement. She was on multiple combined ICS/LABA medications (Advair, Symbicort, Dulera) but never felt better. She was on Xolair here in out practice without  improvement for several months. She denies daily or nighttime symptoms at this time. She did have some problems with cold air exposure. She estimates that she needs prednisone around 1 time per year. She has not needed any ED visits or hospitalizations in several years. She feels that her symptoms are under good control, however she has changed her activity level due to a decrease in endurance.   We first saw Victoria [name] in August 2009. At that time, she was on Symbicort 160/4.5 two puffs twice a day. She was continued on this medication as well as Singulair. Skin testing at that time was positive to molds, dust mite, cat, feathers, and cockroach as well as grass, weed, and trees. In September 2009, Qvar 40 g 2 puffs at noon was added to her regimen. In December 2009, Xolair was started. Xolair was continued for around 1 year without improvement. She was continued on her asthma medications. [facility] March 2011, [facility]. DRuthe Mannanwas continued until she stopped seeing Victoria [name] 2014. When she started seeing Dr. YAnnamaria Bootsin 2015, he started her on theophylline and changed her Singulair to every other day. It seems that over time cost has been a problem for her with regards to her medications. Her co-pays are listed in several notes as barriers to her receiving medications. However, from talking to her today it seems that overall she just does not want to be on medications.   KEriyahdoes have allergic rhinitis consisting of intermittent rhinorrhea. She uses a nose spray intermittently. She was on allergy shots at one time for around three years (20+ years ago), however she did not feel that they helped at all. She is also reporting hives as well as itching which depends on what she eats. She reports that she is having reactions to multiple foods and is interested in allergy testing for foods today. She has only ever had hives without systemic symptoms including wheezing, abdominal pain, vomiting,  diarrhea, or throat swelling. She has never needed an EpiPen for her symptoms.  Otherwise, there is no history of other atopic diseases, including asthma, drug allergies, food allergies, environmental allergies, stinging insect allergies, or urticaria. There is no significant infectious history. Vaccinations are up to date.    Past Medical History: Patient Active Problem List   Diagnosis Date Noted  . Asthmatic bronchitis with exacerbation 08/11/2014  . Allergic urticaria 04/25/2014  . CANDIDIASIS, ORAL 01/08/2010  . ENLARGEMENT OF LYMPH NODES 10/05/2009  . Seasonal and perennial allergic rhinitis 10/05/2007  . Asthma, mild intermittent, well-controlled 10/05/2007    Medication List:    Medication List       Accurate as of 04/30/16 10:19 PM. Always use your most recent med list.          albuterol 108 (90 Base) MCG/ACT inhaler Commonly known as:  PROVENTIL HFA;VENTOLIN HFA Inhale 2 puffs into the lungs every 6 (six) hours as needed for wheezing or shortness of breath.   Albuterol Sulfate 108 (90 Base) MCG/ACT Aepb Commonly known as:  PROAIR RESPICLICK Inhale 4 Doses into the lungs every 4 (four) hours as  needed. For cough or wheeze   BENADRYL 25 MG tablet Generic drug:  diphenhydrAMINE Take 25 mg by mouth at bedtime as needed for allergies or sleep.   cetirizine 10 MG tablet Commonly known as:  ZYRTEC Take 10 mg by mouth as needed for allergies.   EPINEPHrine 0.3 mg/0.3 mL Soaj injection Commonly known as:  AUVI-Q Inject 0.3 mLs (0.3 mg total) into the muscle once.   fluticasone 50 MCG/ACT nasal spray Commonly known as:  FLONASE Place 2 sprays into both nostrils daily as needed for allergies.   hydrochlorothiazide 25 MG tablet Commonly known as:  HYDRODIURIL Take 1 tablet by mouth daily.   montelukast 10 MG tablet Commonly known as:  SINGULAIR Take 1 tablet (10 mg total) by mouth at bedtime.   pantoprazole 20 MG tablet Commonly known as:  PROTONIX Take 1  tablet (20 mg total) by mouth daily.   polyethylene glycol packet Commonly known as:  MIRALAX / GLYCOLAX Take 17 g by mouth daily.   pseudoephedrine 30 MG tablet Commonly known as:  SUDAFED Take 30 mg by mouth every 4 (four) hours as needed for congestion.   STOOL SOFTENER 100 MG capsule Generic drug:  docusate sodium Take 100 mg by mouth daily.       Birth History: non-contributory.   Developmental History: Kaitlen has met all milestones on time.    Past Surgical History: Past Surgical History:  Procedure Laterality Date  . BALLOON DILATION N/A 11/19/2015   Procedure: BALLOON DILATION;  Surgeon: Garlan Fair, MD;  Location: [facility];  Service: Endoscopy;  Laterality: N/A;  . COLONOSCOPY WITH PROPOFOL N/A 10/05/2012   Procedure: COLONOSCOPY WITH PROPOFOL;  Surgeon: Garlan Fair, MD;  Location: [facility];  Service: Endoscopy;  Laterality: N/A;  . ESOPHAGOGASTRODUODENOSCOPY (EGD) WITH PROPOFOL N/A 11/19/2015   Procedure: ESOPHAGOGASTRODUODENOSCOPY (EGD) WITH PROPOFOL;  Surgeon: Garlan Fair, MD;  Location: [facility];  Service: Endoscopy;  Laterality: N/A;  . FOOT SURGERY Bilateral    bunionectomy- bilateral  . TUBAL LIGATION  27 years ago     Family History: Family History  Problem Relation Age of Onset  . Emphysema Father   . Lung cancer Father   . High Cholesterol Mother   . Osteoporosis Mother   . Hypertension Mother   . Asthma Son     exercise induced  . Migraines Daughter   . Hypertension Son   . Hypertension Sister      Social History: Terea lives at home with her husband. They live in a home that is 65 years old. There is carpeting throughout the home. They have electric heating and window units for her air conditoning. She does have children who have moved out of the home. There are no pets at home and no smoking exposure. 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    Review of Systems: a 14-point review of systems is pertinent  for what is mentioned in HPI.  Otherwise, all other systems were negative. Constitutional: negative other than that listed in the HPI Eyes: negative other than that listed in the HPI Ears, nose, mouth, throat, and face: negative other than that listed in the HPI Respiratory: negative other than that listed in the HPI Cardiovascular: negative other than that listed in the HPI Gastrointestinal: negative other than that listed in the HPI Genitourinary: negative other than that listed in the HPI Integument: negative other than that listed in the HPI Hematologic: negative other than that listed in the  HPI Musculoskeletal: negative other than that listed in the HPI Neurological: negative other than that listed in the HPI Allergy/Immunologic: negative other than that listed in the HPI    Objective:   Blood pressure 118/84, pulse (!) 50, temperature 98.5 F (36.9 C), temperature source Oral, resp. rate 16, height 5' 4"  (1.626 m), weight 180 lb 12.8 oz (82 kg), last menstrual period 03/30/2012, SpO2 98 %. Body mass index is 31.03 kg/m.   Physical Exam:  General: Alert, interactive, in no acute distress. Cooperative with the exam.  HEENT: TMs pearly gray, turbinates markedly edematous with clear discharge, post-pharynx erythematous. Neck: Supple without thyromegaly. Adenopathy: no enlarged lymph nodes appreciated in the anterior cervical, occipital, axillary, epitrochlear, inguinal, or popliteal regions Lungs: Clear to auscultation without wheezing, rhonchi or rales. No increased work of breathing. CV: Physiologic splitting of S1/S2, no murmurs. Capillary refill <2 seconds.  Abdomen: Nondistended, nontender. No guarding or rebound tenderness. Bowel sounds present in all fields  Skin: Warm and dry, without lesions or rashes. Extremities:  No clubbing, cyanosis or edema. Neuro:   Grossly intact.  Diagnostic studies:  Spirometry: results abnormal (FEV1: 1.19/54%, FVC: 2.12/80%, FEV1/FVC:  55%).    Spirometry consistent with mixed obstructive and restrictive disease. Albuterol nebulizer treatment given in clinic with significant improvement. Her FEV1 increased 300 mL (26%) and her FEF 25-75 percent increased 360 mL (63%). This is significant for ATS criteria.  Allergy Studies:   Indoor/Outdoor Percutaneous Adult Environmental Panel: positive to grasses, weeds, trees, molds, dust mite, cat, and cockroach  Selected Foods Panel: positive to shellfish with adequate controls. Negative to peanut, wheat, milk, egg, rice, [place], chicken, potato, banana, apple, onion, carrots, flounder, trout, tuna, chocolate, yeast, salmon, almond, and garlic     Salvatore Marvel, MD [place] [facility] [place] [facility] [place]

## 2016-05-08 ENCOUNTER — Telehealth: Payer: Self-pay | Admitting: Allergy & Immunology

## 2016-05-08 NOTE — Telephone Encounter (Signed)
ProAir respiclick was sent. Please advise on an alternative?

## 2016-05-08 NOTE — Telephone Encounter (Signed)
Patient saw Dr. Ernst Bowler 04-30-16 and he prescribed her an inhaler, she didn't know the name, but said it was too expensive and wondered if there was a less expensive option.

## 2016-05-09 MED ORDER — ALBUTEROL SULFATE HFA 108 (90 BASE) MCG/ACT IN AERS: 4.0000 | INHALATION_SPRAY | RESPIRATORY_TRACT | 1 refills | Status: DC | PRN

## 2016-05-09 NOTE — Telephone Encounter (Signed)
Reviewed note - we can send in a ProAir HFA instead (four puffs Q4-6 hours PRN).   Salvatore Marvel, MD [place] [facility] [place]

## 2016-05-13 ENCOUNTER — Ambulatory Visit: Payer: Managed Care, Other (non HMO) | Admitting: Allergy and Immunology

## 2016-05-15 ENCOUNTER — Ambulatory Visit (INDEPENDENT_AMBULATORY_CARE_PROVIDER_SITE_OTHER): Payer: Managed Care, Other (non HMO) | Admitting: Family Medicine

## 2016-05-15 ENCOUNTER — Encounter: Payer: Self-pay | Admitting: Family Medicine

## 2016-05-15 VITALS — BP 118/82 | HR 59 | Temp 98.3°F | Ht 63.75 in | Wt 184.1 lb

## 2016-05-15 DIAGNOSIS — J4520 Mild intermittent asthma, uncomplicated: Secondary | ICD-10-CM | POA: Diagnosis not present

## 2016-05-15 DIAGNOSIS — R140 Abdominal distension (gaseous): Secondary | ICD-10-CM | POA: Diagnosis not present

## 2016-05-15 DIAGNOSIS — Z6831 Body mass index (BMI) 31.0-31.9, adult: Secondary | ICD-10-CM | POA: Diagnosis not present

## 2016-05-15 DIAGNOSIS — J302 Other seasonal allergic rhinitis: Secondary | ICD-10-CM

## 2016-05-15 DIAGNOSIS — Z7689 Persons encountering health services in other specified circumstances: Secondary | ICD-10-CM

## 2016-05-15 DIAGNOSIS — J3089 Other allergic rhinitis: Secondary | ICD-10-CM

## 2016-05-15 NOTE — Progress Notes (Signed)
Pre visit review using our clinic review tool, if applicable. No additional management support is needed unless otherwise documented below in the visit note. 

## 2016-05-15 NOTE — Progress Notes (Signed)
HPI:  Victoria [name] is here to establish care. Used to see Dr. [name] - and wanted to change her doctor as was not happy with care regarding her chronic intestinal distress.  Last PCP and physical: Sees Dr. [name] - aware of her bloating issues, has yearly exam. UTD on pap and mammogram there.  Has the following chronic problems that require follow up and concerns today:  GERD/Hx esophageal stricture: -she has hx bad acid reflux and and had dilation of the esophagus earlier this year -takes protonix 20mg  daily -she feels like most foods upset her stomach chronically -hx cramping sometime, cramping, occ constipation - no bleeding, fevers, malaise, weight loss -takes mirilax  -she wants testing for celiac sprue -lactose intolerant   Asthma/Alergies: -sees allergy and asthma specialist -on singulair, flonase, zyrtec, alb prn  HTN: -med: hctz  Obesity: -diet poor and no regular exercise  ROS negative for unless reported above: fevers, unintentional weight loss, hearing or vision loss, chest pain, palpitations, struggling to breath, hemoptysis, melena, hematochezia, hematuria, falls, loc, si, thoughts of self harm  Past Medical History:  Diagnosis Date  . Abnormal Pap smear of cervix   . Allergic rhinitis   . Asthma   . GERD (gastroesophageal reflux disease)   . Headache(784.0)    tension- not an issue  . Hypertension     Past Surgical History:  Procedure Laterality Date  . BALLOON DILATION N/A 11/19/2015   Procedure: BALLOON DILATION;  Surgeon: Garlan Fair, MD;  Location: [facility];  Service: Endoscopy;  Laterality: N/A;  . COLONOSCOPY WITH PROPOFOL N/A 10/05/2012   Procedure: COLONOSCOPY WITH PROPOFOL;  Surgeon: Garlan Fair, MD;  Location: [facility];  Service: Endoscopy;  Laterality: N/A;  . ESOPHAGOGASTRODUODENOSCOPY (EGD) WITH PROPOFOL N/A 11/19/2015   Procedure: ESOPHAGOGASTRODUODENOSCOPY (EGD) WITH PROPOFOL;  Surgeon: Garlan Fair, MD;   Location: [facility];  Service: Endoscopy;  Laterality: N/A;  . FOOT SURGERY Bilateral    bunionectomy- bilateral  . TUBAL LIGATION  27 years ago    Family History  Problem Relation Age of Onset  . Emphysema Father   . Lung cancer Father   . High Cholesterol Mother   . Osteoporosis Mother   . Hypertension Mother   . Asthma Son     exercise induced  . Migraines Daughter   . Hypertension Son   . Hypertension Sister     Social History   Social History  . Marital status: Married    Spouse name: N/A  . Number of children: 2  . Years of education: N/A   Occupational History  . 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 Smoking status: Never Smoker  . Smokeless tobacco: Never Used  . Alcohol use No  . Drug use: No  . Sexual activity: Yes    Partners: Male    Birth control/ protection: Surgical, Post-menopausal     Comment: Tubal Ligation   Other Topics Concern  . None   Social History Narrative   Work or School: [employer] - full time, like job      Home Situation: lives with husband      Spiritual Beliefs: Christian - baptist      Lifestyle: no regular exercise, diet "bad"        Current Outpatient Prescriptions:  .  albuterol (PROAIR HFA) 108 (90 Base) MCG/ACT inhaler, Inhale 4 puffs into the lungs every 4 (four) hours as needed for wheezing or shortness of breath., Disp:  1 Inhaler, Rfl: 1 .  cetirizine (ZYRTEC) 10 MG tablet, Take 10 mg by mouth as needed for allergies. , Disp: , Rfl:  .  diphenhydrAMINE (BENADRYL) 25 MG tablet, Take 25 mg by mouth at bedtime as needed for allergies or sleep. , Disp: , Rfl:  .  docusate sodium (STOOL SOFTENER) 100 MG capsule, Take 100 mg by mouth daily., Disp: , Rfl:  .  EPINEPHrine (AUVI-Q IJ), Inject as directed., Disp: , Rfl:  .  fluticasone (FLONASE) 50 MCG/ACT nasal spray, Place 2 sprays into both nostrils daily as needed for allergies., Disp: 16 g, Rfl: 5 .  hydrochlorothiazide  (HYDRODIURIL) 25 MG tablet, Take 1 tablet by mouth daily., Disp: , Rfl:  .  montelukast (SINGULAIR) 10 MG tablet, Take 1 tablet (10 mg total) by mouth at bedtime., Disp: 30 tablet, Rfl: 5 .  pantoprazole (PROTONIX) 20 MG tablet, Take 1 tablet (20 mg total) by mouth daily., Disp: 30 tablet, Rfl: 0 .  polyethylene glycol (MIRALAX / GLYCOLAX) packet, Take 17 g by mouth daily., Disp: , Rfl:  .  pseudoephedrine (SUDAFED) 30 MG tablet, Take 30 mg by mouth every 4 (four) hours as needed for congestion., Disp: , Rfl:   EXAM:  Vitals:   05/15/16 1554  BP: 118/82  Pulse: (!) 59  Temp: 98.3 F (36.8 C)    Body mass index is 31.85 kg/m.  GENERAL: vitals reviewed and listed above, alert, oriented, appears well hydrated and in no acute distress  HEENT: atraumatic, conjunttiva clear, no obvious abnormalities on inspection of external nose and ears  NECK: no obvious masses on inspection  LUNGS: clear to auscultation bilaterally, no wheezes, rales or rhonchi, good air movement  CV: HRRR, no peripheral edema  ABS: BS+, soft, NTTP  MS: moves all extremities without noticeable abnormality  PSYCH: pleasant and cooperative, no obvious depression or anxiety  ASSESSMENT AND PLAN:  Discussed the following assessment and plan:  Bloating - Plan: TSH, Celiac Ab tTG DGP TIgA, Basic metabolic panel, CBC (no diff)  Asthma, mild intermittent, well-controlled  Seasonal and perennial allergic rhinitis  BMI 31.0-31.9,adult - Plan: Hemoglobin A1c, Cholesterol, Total, HDL cholesterol  Encounter to establish care -We reviewed the PMH, PSH, FH, SH, Meds and Allergies. -We provided refills for any medications we will prescribe as needed. -We addressed current concerns per orders and patient instructions. -We have asked for records for pertinent exams, studies, vaccines and notes from previous providers. -We have advised patient to follow up per instructions below. -labs per orders -discussed her  symptoms sound like IBS and management options - she wants to start with fiber and trying elimination diet -discussed could then do imaging/GI eval if persistent or worsening   -Patient advised to return or notify a doctor immediately if symptoms worsen or persist or new concerns arise.  Patient Instructions  BEFORE YOU LEAVE: -labs -fodmaps -follow up: 2-3 months  We have ordered labs or studies at this visit. It can take up to 1-2 weeks for results and processing. IF results require follow up or explanation, we will call you with instructions. Clinically stable results will be released to your [facility]. If you have not heard from [place] or cannot find your results in [facility] in 2 weeks please contact our office at [phone].   If you are not yet signed up for [facility], please SIGN UP TODAY. We now offer online scheduling, same day appointments and extended hours. WHEN YOU DON'T FEEL YOUR BEST.Marland KitchenMarland KitchenWE ARE HERE TO HELP.  Please  try the elimination diet. 2-4 weeks of each food. Start with dairy, eggs, then gluten ---> then look at fodmaps to decide next food to eliminate.  Fiber supplement every morning. Metamucil.   We recommend the following healthy lifestyle for LIFE: 1) Small portions.   Tip: eat off of a salad plate instead of a dinner plate.  Tip: if you need more or a snack choose fruits, veggies and/or a handful of nuts or seeds.  2) Eat a healthy clean diet.  * Tip: Avoid (less then 1 serving per week): processed foods, sweets, sweetened drinks, white starches (rice, flour, bread, potatoes, pasta, etc), red meat, fast foods, butter  *Tip: CHOOSE instead   * 5-9 servings per day of fresh or frozen fruits and vegetables (but not corn, potatoes, bananas, canned or dried fruit)   *nuts and seeds, beans   *olives and olive oil   *small portions of lean meats such as fish and white chicken    *small portions of whole grains  3)Get at least 150 minutes of sweaty aerobic exercise per  week.  4)Reduce stress - consider counseling, meditation and relaxation to balance other aspects of your life.                  Colin Benton R.

## 2016-05-15 NOTE — Patient Instructions (Addendum)
BEFORE YOU LEAVE: -labs -fodmaps -follow up: 2-3 months  We have ordered labs or studies at this visit. It can take up to 1-2 weeks for results and processing. IF results require follow up or explanation, we will call you with instructions. Clinically stable results will be released to your [facility]. If you have not heard from [place] or cannot find your results in [facility] in 2 weeks please contact our office at [phone].   If you are not yet signed up for [facility], please SIGN UP TODAY. We now offer online scheduling, same day appointments and extended hours. WHEN YOU DON'T FEEL YOUR BEST.Marland KitchenMarland KitchenWE ARE HERE TO HELP.  Please try the elimination diet. 2-4 weeks of each food. Start with dairy, eggs, then gluten ---> then look at fodmaps to decide next food to eliminate.  Fiber supplement every morning. Metamucil.   We recommend the following healthy lifestyle for LIFE: 1) Small portions.   Tip: eat off of a salad plate instead of a dinner plate.  Tip: if you need more or a snack choose fruits, veggies and/or a handful of nuts or seeds.  2) Eat a healthy clean diet.  * Tip: Avoid (less then 1 serving per week): processed foods, sweets, sweetened drinks, white starches (rice, flour, bread, potatoes, pasta, etc), red meat, fast foods, butter  *Tip: CHOOSE instead   * 5-9 servings per day of fresh or frozen fruits and vegetables (but not corn, potatoes, bananas, canned or dried fruit)   *nuts and seeds, beans   *olives and olive oil   *small portions of lean meats such as fish and white chicken    *small portions of whole grains  3)Get at least 150 minutes of sweaty aerobic exercise per week.  4)Reduce stress - consider counseling, meditation and relaxation to balance other aspects of your life.

## 2016-05-16 LAB — BASIC METABOLIC PANEL
BUN: 11 mg/dL (ref 6–23)
CHLORIDE: 102 meq/L (ref 96–112)
CO2: 33 meq/L — AB (ref 19–32)
Calcium: 9.6 mg/dL (ref 8.4–10.5)
Creatinine, Ser: 0.86 mg/dL (ref 0.40–1.20)
GFR: 88.52 mL/min (ref >60.00)
Glucose, Bld: 102 mg/dL — ABNORMAL HIGH (ref 70–99)
POTASSIUM: 3.5 meq/L (ref 3.5–5.1)
SODIUM: 143 meq/L (ref 135–145)

## 2016-05-16 LAB — CBC
HEMATOCRIT: 37.8 % (ref 36.0–46.0)
Hemoglobin: 12.5 g/dL (ref 12.0–15.0)
MCHC: 33.1 g/dL (ref 30.0–36.0)
MCV: 85 fl (ref 78.0–100.0)
PLATELETS: 241 10*3/uL (ref 150.0–400.0)
RBC: 4.45 Mil/uL (ref 3.87–5.11)
RDW: 14.6 % (ref 11.5–15.5)
WBC: 4.5 10*3/uL (ref 4.0–10.5)

## 2016-05-16 LAB — TSH: TSH: 0.94 u[IU]/mL (ref 0.35–4.50)

## 2016-05-16 LAB — HEMOGLOBIN A1C: Hgb A1c MFr Bld: 6.3 % (ref 4.6–6.5)

## 2016-05-16 LAB — CHOLESTEROL, TOTAL: CHOLESTEROL: 267 mg/dL — AB (ref 0–200)

## 2016-05-16 LAB — HDL CHOLESTEROL: HDL: 141.1 mg/dL (ref >39.00)

## 2016-06-04 ENCOUNTER — Other Ambulatory Visit: Payer: Self-pay | Admitting: *Deleted

## 2016-06-04 ENCOUNTER — Telehealth: Payer: Self-pay | Admitting: Family Medicine

## 2016-06-04 ENCOUNTER — Telehealth: Payer: Self-pay | Admitting: Allergy & Immunology

## 2016-06-04 DIAGNOSIS — K589 Irritable bowel syndrome without diarrhea: Secondary | ICD-10-CM

## 2016-06-04 MED ORDER — ALBUTEROL SULFATE HFA 108 (90 BASE) MCG/ACT IN AERS: INHALATION_SPRAY | RESPIRATORY_TRACT | 1 refills | Status: DC

## 2016-06-04 NOTE — Telephone Encounter (Signed)
Pt seen 11/18 and still having same issues.  Would ike to know if Dr Maudie Mercury would have her try something else? Pt declined appt. Hopes dr Maudie Mercury can address without appt.

## 2016-06-04 NOTE — Telephone Encounter (Signed)
She cant use the lasti nhaler that was called. She says that she called 3 weeks ago and hasn't heard back. Is there a different one that can be sent in?

## 2016-06-04 NOTE — Telephone Encounter (Signed)
Spoke with patient, she states that the Proair was too expensive so we will send Proventil to see if it's cheaper. She will let us know if it's too much.

## 2016-06-04 NOTE — Telephone Encounter (Signed)
L/M for patient to call office. Which inhaler last one sent was Proair HFA?

## 2016-06-05 NOTE — Telephone Encounter (Signed)
I called the pt and informed her of the message below and she stated she would prefer to see the specialist.  She is aware the referral was placed to GI and someone will call with appt info.

## 2016-06-05 NOTE — Addendum Note (Signed)
Addended by: Agnes Lawrence on: 06/05/2016 01:31 PM   Modules accepted: Orders

## 2016-06-05 NOTE — Telephone Encounter (Signed)
next step would be to see gi (ok to refer)or possibly try medications for IBS (would need appt to discuss meds if preferred that to seeing GI.).

## 2016-08-07 ENCOUNTER — Ambulatory Visit: Payer: Managed Care, Other (non HMO) | Admitting: Allergy & Immunology

## 2016-08-15 ENCOUNTER — Ambulatory Visit: Payer: Managed Care, Other (non HMO) | Admitting: Family Medicine

## 2016-08-28 ENCOUNTER — Ambulatory Visit (HOSPITAL_COMMUNITY)
Admission: EM | Admit: 2016-08-28 | Discharge: 2016-08-28 | Disposition: A | Discharge status: Home / Self Care | Payer: Managed Care, Other (non HMO) | Attending: Internal Medicine | Admitting: Internal Medicine

## 2016-08-28 ENCOUNTER — Encounter (HOSPITAL_COMMUNITY): Payer: Self-pay | Admitting: *Deleted

## 2016-08-28 DIAGNOSIS — N390 Urinary tract infection, site not specified: Secondary | ICD-10-CM | POA: Insufficient documentation

## 2016-08-28 DIAGNOSIS — Z79899 Other long term (current) drug therapy: Secondary | ICD-10-CM | POA: Insufficient documentation

## 2016-08-28 DIAGNOSIS — Z882 Allergy status to sulfonamides status: Secondary | ICD-10-CM | POA: Diagnosis not present

## 2016-08-28 DIAGNOSIS — Z91013 Allergy to seafood: Secondary | ICD-10-CM | POA: Diagnosis not present

## 2016-08-28 LAB — POCT URINALYSIS DIP (DEVICE)
Bilirubin Urine: NEGATIVE
GLUCOSE, UA: NEGATIVE mg/dL
KETONES UR: NEGATIVE mg/dL
Nitrite: NEGATIVE
Protein, ur: NEGATIVE mg/dL
SPECIFIC GRAVITY, URINE: 1.02 (ref 1.005–1.030)
UROBILINOGEN UA: 0.2 mg/dL (ref 0.0–1.0)
pH: 6 (ref 5.0–8.0)

## 2016-08-28 MED ORDER — CEPHALEXIN 500 MG PO CAPS: 500.0000 mg | ORAL_CAPSULE | Freq: Four times a day (QID) | ORAL | 0 refills | Status: DC

## 2016-08-28 NOTE — ED Triage Notes (Signed)
Pt  Reports    Symptoms  Of      uti      Frequent  Scanty       Output       With   Some  Back  Pain  As   Well    Pt reports  Symptoms  X   Several   Days

## 2016-08-28 NOTE — ED Notes (Signed)
rx for keflex  500  Mg  Qid   28 pills   Phoned  To  [facility]   On  [address] per  pts  Request  Read  Back  And  Verified  By  Pharmacist

## 2016-08-28 NOTE — Discharge Instructions (Signed)
Be sure to take all your antibiotic medication until gone. Drink plenty of fluids and stay well-hydrated you may take the AZO tablets 2 of them 3 times a day for bladder infection symptoms. Usually you only had to take this for the first couple of days. Your urine will be cultured. If there is a germ that is not covered by this antibiotic we will call you and replace antibiotic by phone.

## 2016-08-28 NOTE — ED Provider Notes (Signed)
CSN: [id number]     Arrival date & time 08/28/16  1602 History   First MD Initiated Contact with Patient 08/28/16 1633     Chief Complaint  Patient presents with  . Urinary Tract Infection   (Consider location/radiation/quality/duration/timing/severity/associated sxs/prior Treatment) 56 year old female complaining of "UTI symptoms". She states that she has had a history of UTIs and symptoms feel similar. Complaining of urinary frequency, a pressure-type feeling in the suprapubic area and a recent pain in the right lower most back which has nearly abated now.      Past Medical History:  Diagnosis Date  . Abnormal Pap smear of cervix   . Allergic rhinitis   . Asthma   . GERD (gastroesophageal reflux disease)   . Headache(784.0)    tension- not an issue  . Hypertension    Past Surgical History:  Procedure Laterality Date  . BALLOON DILATION N/A 11/19/2015   Procedure: BALLOON DILATION;  Surgeon: Garlan Fair, MD;  Location: [facility];  Service: Endoscopy;  Laterality: N/A;  . COLONOSCOPY WITH PROPOFOL N/A 10/05/2012   Procedure: COLONOSCOPY WITH PROPOFOL;  Surgeon: Garlan Fair, MD;  Location: [facility];  Service: Endoscopy;  Laterality: N/A;  . ESOPHAGOGASTRODUODENOSCOPY (EGD) WITH PROPOFOL N/A 11/19/2015   Procedure: ESOPHAGOGASTRODUODENOSCOPY (EGD) WITH PROPOFOL;  Surgeon: Garlan Fair, MD;  Location: [facility];  Service: Endoscopy;  Laterality: N/A;  . FOOT SURGERY Bilateral    bunionectomy- bilateral  . TUBAL LIGATION  27 years ago   Family History  Problem Relation Age of Onset  . Emphysema Father   . Lung cancer Father   . High Cholesterol Mother   . Osteoporosis Mother   . Hypertension Mother   . Asthma Son     exercise induced  . Migraines Daughter   . Hypertension Son   . Hypertension Sister    Social History  Substance Use Topics  . Smoking status: Never Smoker  . Smokeless tobacco: Never Used  . Alcohol use No   OB History    Gravida  Para Term Preterm AB Living   2 2 2     2    SAB TAB Ectopic Multiple Live Births           1     Review of Systems  Constitutional: Negative.   Gastrointestinal: Negative.   Genitourinary: Positive for frequency and urgency.  Skin: Negative.   Neurological: Negative.   All other systems reviewed and are negative.   Allergies  Shellfish allergy; Sulfonamide derivatives; and Nasonex [mometasone furoate]  Home Medications   Prior to Admission medications   Medication Sig Start Date End Date Taking? Authorizing Provider  albuterol (PROVENTIL HFA) 108 (90 Base) MCG/ACT inhaler Inhale two puffs every 4-6 hours if needed for cough or wheeze 06/04/16   Valentina Shaggy, MD  cephALEXin (KEFLEX) 500 MG capsule Take 1 capsule (500 mg total) by mouth 4 (four) times daily. 08/28/16   Janne Napoleon, NP  cetirizine (ZYRTEC) 10 MG tablet Take 10 mg by mouth as needed for allergies.     Historical Provider, MD  diphenhydrAMINE (BENADRYL) 25 MG tablet Take 25 mg by mouth at bedtime as needed for allergies or sleep.     Historical Provider, MD  docusate sodium (STOOL SOFTENER) 100 MG capsule Take 100 mg by mouth daily.    Historical Provider, MD  EPINEPHrine (AUVI-Q IJ) Inject as directed.    Historical Provider, MD  fluticasone (FLONASE) 50 MCG/ACT nasal spray Place 2 sprays into both nostrils  daily as needed for allergies. 04/30/16   Valentina Shaggy, MD  hydrochlorothiazide (HYDRODIURIL) 25 MG tablet Take 1 tablet by mouth daily. 10/07/13   Historical Provider, MD  montelukast (SINGULAIR) 10 MG tablet Take 1 tablet (10 mg total) by mouth at bedtime. 04/30/16   Valentina Shaggy, MD  pantoprazole (PROTONIX) 20 MG tablet Take 1 tablet (20 mg total) by mouth daily. 09/20/15   Charlesetta Shanks, MD  polyethylene glycol (MIRALAX / GLYCOLAX) packet Take 17 g by mouth daily.    Historical Provider, MD  pseudoephedrine (SUDAFED) 30 MG tablet Take 30 mg by mouth every 4 (four) hours as needed for congestion.     Historical Provider, MD   Meds Ordered and Administered this Visit  Medications - No data to display  BP 120/70 (BP Location: Right Arm)   Pulse 78   Temp 98.6 F (37 C) (Oral)   Resp 16   LMP 03/30/2012   SpO2 100%  No data found.   Physical Exam  Constitutional: She is oriented to person, place, and time. She appears well-developed and well-nourished. No distress.  Eyes: EOM are normal.  Neck: Normal range of motion. Neck supple.  Cardiovascular: Normal rate.   Pulmonary/Chest: Effort normal. No respiratory distress.  Musculoskeletal: She exhibits no edema.  Neurological: She is alert and oriented to person, place, and time. She exhibits normal muscle tone.  Skin: Skin is warm and dry.  Psychiatric: She has a normal mood and affect.  Nursing note and vitals reviewed.   Urgent Care Course     Procedures (including critical care time)  Labs Review Labs Reviewed  POCT URINALYSIS DIP (DEVICE) - Abnormal; Notable for the following:       Result Value   Hgb urine dipstick MODERATE (*)    Leukocytes, UA SMALL (*)    All other components within normal limits    Imaging Review No results found.   Visual Acuity Review  Right Eye Distance:   Left Eye Distance:   Bilateral Distance:    Right Eye Near:   Left Eye Near:    Bilateral Near:         MDM   1. Lower urinary tract infectious disease    Be sure to take all your antibiotic medication until gone. Drink plenty of fluids and stay well-hydrated you may take the AZO tablets 2 of them 3 times a day for bladder infection symptoms. Usually you only had to take this for the first couple of days. Your urine will be cultured. If there is a germ that is not covered by this antibiotic we will call you and replace antibiotic by phone. Meds ordered this encounter  Medications  . cephALEXin (KEFLEX) 500 MG capsule    Sig: Take 1 capsule (500 mg total) by mouth 4 (four) times daily.    Dispense:  28 capsule     Refill:  0    Order Specific Question:   Supervising Provider    Answer:   Sherlene Shams [id number]       Janne Napoleon, NP 08/28/16 1650

## 2016-08-29 LAB — URINE CULTURE: SPECIAL REQUESTS: NORMAL

## 2016-09-25 ENCOUNTER — Telehealth: Payer: Self-pay | Admitting: Family Medicine

## 2016-09-25 NOTE — Telephone Encounter (Signed)
Pt would like to have a referral to a GI doctor not sure if she needs to call her insurance.  Pt did not state why she needs the referral.

## 2016-09-30 NOTE — Telephone Encounter (Signed)
Need to find out what referral is for - looks like she saw Dr. Hassell Done in the past for endoscopy. Appt if needed or need dx for referral. Thanks.

## 2016-09-30 NOTE — Telephone Encounter (Signed)
I called the pt and informed her of the results and she stated she needs an appt for her physical and will discuss this at that time.  Physical appt scheduled for 4/24.

## 2016-10-01 ENCOUNTER — Other Ambulatory Visit: Payer: Self-pay | Admitting: Internal Medicine

## 2016-10-21 ENCOUNTER — Ambulatory Visit (INDEPENDENT_AMBULATORY_CARE_PROVIDER_SITE_OTHER): Payer: 59 | Admitting: Family Medicine

## 2016-10-21 ENCOUNTER — Encounter: Payer: Self-pay | Admitting: Family Medicine

## 2016-10-21 VITALS — BP 100/80 | HR 58 | Temp 98.2°F | Ht 64.25 in | Wt 186.0 lb

## 2016-10-21 DIAGNOSIS — Z6831 Body mass index (BMI) 31.0-31.9, adult: Secondary | ICD-10-CM | POA: Diagnosis not present

## 2016-10-21 DIAGNOSIS — E669 Obesity, unspecified: Secondary | ICD-10-CM

## 2016-10-21 DIAGNOSIS — Z0000 Encounter for general adult medical examination without abnormal findings: Secondary | ICD-10-CM

## 2016-10-21 DIAGNOSIS — K5909 Other constipation: Secondary | ICD-10-CM | POA: Diagnosis not present

## 2016-10-21 DIAGNOSIS — R5383 Other fatigue: Secondary | ICD-10-CM

## 2016-10-21 DIAGNOSIS — Z23 Encounter for immunization: Secondary | ICD-10-CM

## 2016-10-21 DIAGNOSIS — R140 Abdominal distension (gaseous): Secondary | ICD-10-CM | POA: Diagnosis not present

## 2016-10-21 LAB — VITAMIN B12: Vitamin B-12: 210 pg/mL — ABNORMAL LOW (ref 211–911)

## 2016-10-21 LAB — HEMOGLOBIN A1C: Hgb A1c MFr Bld: 6.4 % (ref 4.6–6.5)

## 2016-10-21 NOTE — Progress Notes (Signed)
HPI:  Here for CPE: Sees Dr. [name] for women's health/gyn physical and reports is UTD on paps and women's health. -Concerns and/or follow up today:   HLD, Prediabetes. Feels diet is ok. No regular exercise. Feels too tired after work. Knows this is not a good excuse. Denies anxiety or depression. HDL was 140 and LDL 97 at work recently on fasting labs.  Chronic bloating and GI symptoms: -has seen GI in the past, left prior PCP as feels issues not taken seriously -last visit advised trial fiber supplement and metamucil for constipation and elimination diet -reports nothing worked - taking mirilax every day and if misses a day has bad constipation -reports wants to see GI at [facility] and requests referral  -Diet: variety of foods, balance and well rounded, larger portion sizes  -Exercise: no regular exercise  -Taking folic acid, vitamin D or calcium: no  -Diabetes and Dyslipidemia Screening: cholesterol and glucose done at work recently  -Hx of HTN: no  -Vaccines: UTD except she is not sure when she had her tetanus booster and wants to do today  -pap history: sees gyn, utd per her report  -sexual activity: yes, female partner, no new partners  -wants STI testing (Hep C if born [birth year]): no to STI, agrees to Hep C  -FH breast, colon or ovarian ca: see FH Last mammogram: sees gyn for this, reports utd Last colon cancer screening: reports utd  -Alcohol, Tobacco, drug use: see social history  Review of Systems - no fevers, unintentional weight loss, vision loss, hearing loss, chest pain, sob, hemoptysis, melena, hematochezia, hematuria, genital discharge, changing or concerning skin lesions, bleeding, bruising, loc, thoughts of self harm or SI  Past Medical History:  Diagnosis Date  . Abnormal Pap smear of cervix   . Allergic rhinitis   . Asthma   . GERD (gastroesophageal reflux disease)   . Headache(784.0)    tension- not an issue  . Hypertension     Past Surgical  History:  Procedure Laterality Date  . BALLOON DILATION N/A 11/19/2015   Procedure: BALLOON DILATION;  Surgeon: Garlan Fair, MD;  Location: [facility];  Service: Endoscopy;  Laterality: N/A;  . COLONOSCOPY WITH PROPOFOL N/A 10/05/2012   Procedure: COLONOSCOPY WITH PROPOFOL;  Surgeon: Garlan Fair, MD;  Location: [facility];  Service: Endoscopy;  Laterality: N/A;  . ESOPHAGOGASTRODUODENOSCOPY (EGD) WITH PROPOFOL N/A 11/19/2015   Procedure: ESOPHAGOGASTRODUODENOSCOPY (EGD) WITH PROPOFOL;  Surgeon: Garlan Fair, MD;  Location: [facility];  Service: Endoscopy;  Laterality: N/A;  . FOOT SURGERY Bilateral    bunionectomy- bilateral  . TUBAL LIGATION  27 years ago    Family History  Problem Relation Age of Onset  . Emphysema Father   . Lung cancer Father   . High Cholesterol Mother   . Osteoporosis Mother   . Hypertension Mother   . Asthma Son     exercise induced  . Migraines Daughter   . Hypertension Son   . Hypertension Sister     Social History   Social History  . Marital status: Married    Spouse name: N/A  . Number of children: 2  . Years of education: N/A   Occupational History  . 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 Smoking status: Never Smoker  . Smokeless tobacco: Never Used  . Alcohol use No  . Drug use: No  . Sexual activity: Yes    Partners: Male    Birth control/ protection: Surgical,  Post-menopausal     Comment: Tubal Ligation   Other Topics Concern  . None   Social History Narrative   Work or School: [employer] - full time, like job      Home Situation: lives with husband      Spiritual Beliefs: Christian - baptist      Lifestyle: no regular exercise, diet "bad"        Current Outpatient Prescriptions:  .  albuterol (PROVENTIL HFA) 108 (90 Base) MCG/ACT inhaler, Inhale two puffs every 4-6 hours if needed for cough or wheeze, Disp: 1 Inhaler, Rfl: 1 .  cetirizine (ZYRTEC) 10 MG tablet, Take 10  mg by mouth as needed for allergies. , Disp: , Rfl:  .  diphenhydrAMINE (BENADRYL) 25 MG tablet, Take 25 mg by mouth at bedtime as needed for allergies or sleep. , Disp: , Rfl:  .  docusate sodium (STOOL SOFTENER) 100 MG capsule, Take 100 mg by mouth daily., Disp: , Rfl:  .  EPINEPHrine (AUVI-Q IJ), Inject as directed., Disp: , Rfl:  .  fluticasone (FLONASE) 50 MCG/ACT nasal spray, Place 2 sprays into both nostrils daily as needed for allergies., Disp: 16 g, Rfl: 5 .  hydrochlorothiazide (HYDRODIURIL) 25 MG tablet, Take 1 tablet by mouth daily., Disp: , Rfl:  .  montelukast (SINGULAIR) 10 MG tablet, Take 1 tablet (10 mg total) by mouth at bedtime., Disp: 30 tablet, Rfl: 5 .  polyethylene glycol (MIRALAX / GLYCOLAX) packet, Take 17 g by mouth daily., Disp: , Rfl:   EXAM:  Vitals:   10/21/16 1443  BP: 100/80  Pulse: (!) 58  Temp: 98.2 F (36.8 C)    GENERAL: vitals reviewed and listed below, alert, oriented, appears well hydrated and in no acute distress  HEENT: head atraumatic, PERRLA, normal appearance of eyes, ears, nose and mouth. moist mucus membranes.  NECK: supple, no masses or lymphadenopathy  LUNGS: clear to auscultation bilaterally, no rales, rhonchi or wheeze  CV: HRRR, no peripheral edema or cyanosis, normal pedal pulses  ABDOMEN: bowel sounds normal, soft, non tender to palpation, no masses, no rebound or guarding  SKIN: no rash or abnormal lesions  BREAST/GU: declined  RECTAL: deferred   MS: normal gait, moves all extremities normally  NEURO: normal gait, speech and thought processing grossly intact, muscle tone grossly intact throughout  PSYCH: normal affect, pleasant and cooperative  ASSESSMENT AND PLAN:  Discussed the following assessment and plan:  Encounter for preventative adult health care examination  Chronic constipation - Plan: Ambulatory referral to Gastroenterology  Bloating - Plan: Ambulatory referral to Gastroenterology  Class 1 obesity  without serious comorbidity with body mass index (BMI) of 31.0 to 31.9 in adult, unspecified obesity type  Fatigue, unspecified type - Plan: Hemoglobin A1c, Hepatitis C antibody, Vitamin B12   -Discussed and advised all [place] preventive services health task force level A and B recommendations for age, sex and risks.  -Advised at least 150 minutes of exercise per week and a healthy diet with avoidance of (less then 1 serving per week) processed foods, white starches, red meat, fast foods and sweets and consisting of: * 5-9 servings of fresh fruits and vegetables (not corn or potatoes) *nuts and seeds, beans *olives and olive oil *lean meats such as fish and white chicken  *whole grains  -labs, studies and vaccines per orders this encounter  Orders Placed This Encounter  Procedures  . Hemoglobin A1c  . Hepatitis C antibody  . Vitamin B12  . Ambulatory referral to  Gastroenterology    Referral Priority:   Routine    Referral Type:   Consultation    Referral Reason:   Specialty Services Required    Number of Visits Requested:   1    Patient advised to return to clinic immediately if symptoms worsen or persist or new concerns.  Patient Instructions  BEFORE YOU LEAVE: -follow up: 6 months -tetanus booster -labs  -vit D3 1000 IU daily  -We placed a referral for you as discussed to [facility] per your request. It usually takes about 1-2 weeks to process and schedule this referral. If you have not heard from [place] regarding this appointment in 2 weeks please contact our office.  -Please start exercising. Get 30 minutes daily.  -eat a healthy diet and small portions - consider weight watchers and the mediterreanean diet.  We have ordered labs or studies at this visit. It can take up to 1-2 weeks for results and processing. IF results require follow up or explanation, we will call you with instructions. Clinically stable results will be released to your [facility]. If you have not heard from  [place] or cannot find your results in [facility] in 2 weeks please contact our office at [phone].  If you are not yet signed up for [facility], please consider signing up.          No Follow-up on file.  Colin Benton R., DO

## 2016-10-21 NOTE — Patient Instructions (Signed)
BEFORE YOU LEAVE: -follow up: 6 months -tetanus booster -labs  -vit D3 1000 IU daily  -We placed a referral for you as discussed to [facility] per your request. It usually takes about 1-2 weeks to process and schedule this referral. If you have not heard from [place] regarding this appointment in 2 weeks please contact our office.  -Please start exercising. Get 30 minutes daily.  -eat a healthy diet and small portions - consider weight watchers and the mediterreanean diet.  We have ordered labs or studies at this visit. It can take up to 1-2 weeks for results and processing. IF results require follow up or explanation, we will call you with instructions. Clinically stable results will be released to your [facility]. If you have not heard from [place] or cannot find your results in [facility] in 2 weeks please contact our office at [phone].  If you are not yet signed up for [facility], please consider signing up.

## 2016-10-21 NOTE — Addendum Note (Signed)
Addended by: Agnes Lawrence on: 10/21/2016 03:34 PM   Modules accepted: Orders

## 2016-10-21 NOTE — Progress Notes (Signed)
Pre visit review using our clinic review tool, if applicable. No additional management support is needed unless otherwise documented below in the visit note. 

## 2016-10-22 LAB — HEPATITIS C ANTIBODY: HCV Ab: NEGATIVE

## 2016-10-30 ENCOUNTER — Telehealth: Payer: Self-pay | Admitting: Family Medicine

## 2016-10-30 NOTE — Telephone Encounter (Signed)
I think her asthma doc prescribes this? Ok to refill if she needs [place] to.

## 2016-10-30 NOTE — Telephone Encounter (Signed)
I called the pt and informed her of the message below and she stated she will call the specialist tomorrow and call back for a refill from Dr Maudie Mercury if needed.

## 2016-10-30 NOTE — Telephone Encounter (Signed)
Pt was seen on 10-21-16 and needs new generic singulair 10 mg #90 w/refills . [facility]

## 2016-10-31 ENCOUNTER — Telehealth: Payer: Self-pay | Admitting: Allergy & Immunology

## 2016-10-31 MED ORDER — MONTELUKAST SODIUM 10 MG PO TABS: 10.0000 mg | ORAL_TABLET | Freq: Every day | ORAL | 0 refills | Status: DC

## 2016-10-31 NOTE — Telephone Encounter (Signed)
Pt called and said she needs to have singulair 10mg  [facility][phone]

## 2016-10-31 NOTE — Telephone Encounter (Addendum)
No further refills until appt has been made. Called pt to schedule, and she states that she would call back. 1 Month supply sent as a courtesy refill.

## 2016-11-03 NOTE — Telephone Encounter (Signed)
Note states Rx sent in on 5/4 by Dr Luiz Ochoa.

## 2017-01-05 ENCOUNTER — Ambulatory Visit (INDEPENDENT_AMBULATORY_CARE_PROVIDER_SITE_OTHER): Payer: Managed Care, Other (non HMO) | Admitting: Allergy & Immunology

## 2017-01-05 ENCOUNTER — Encounter: Payer: Self-pay | Admitting: Allergy & Immunology

## 2017-01-05 VITALS — BP 120/82 | HR 59 | Temp 98.7°F | Resp 16 | Ht 64.5 in | Wt 182.2 lb

## 2017-01-05 DIAGNOSIS — T781XXD Other adverse food reactions, not elsewhere classified, subsequent encounter: Secondary | ICD-10-CM | POA: Diagnosis not present

## 2017-01-05 DIAGNOSIS — J4530 Mild persistent asthma, uncomplicated: Secondary | ICD-10-CM | POA: Diagnosis not present

## 2017-01-05 DIAGNOSIS — L508 Other urticaria: Secondary | ICD-10-CM | POA: Diagnosis not present

## 2017-01-05 DIAGNOSIS — J302 Other seasonal allergic rhinitis: Secondary | ICD-10-CM

## 2017-01-05 MED ORDER — MONTELUKAST SODIUM 10 MG PO TABS: 10.0000 mg | ORAL_TABLET | Freq: Every day | ORAL | 1 refills | Status: DC

## 2017-01-05 MED ORDER — ALBUTEROL SULFATE 108 (90 BASE) MCG/ACT IN AEPB: 4.0000 | INHALATION_SPRAY | RESPIRATORY_TRACT | 1 refills | Status: DC | PRN

## 2017-01-05 NOTE — Progress Notes (Signed)
FOLLOW UP  Date of Service/Encounter:  01/05/17   Assessment:   Mild persistent asthma - with stable spirometry (mixed obstructive/restrictive pattern) that is not consistent with her symptomology  Chronic seasonal allergic rhinitis (grasses, weeds, trees, molds, dust mite, cat, and cockroach)  Adverse food reaction (shellfish) - with multiple other food intolerances  Chronic urticaria   Asthma Reportables:  Severity: mild persistent  Risk: low Control: well controlled   Plan/Recommendations:    1. Mild persistent asthma, uncomplicated - Lung testing looked similar to the last visit.  - Her reported symptoms have never fit her spirometric findings. - Although we could certainly put her on a daily controller medication, her symptoms do not warrant this. - Continue with Singulair 10mg  daily. - Use ProAir 4 puffs every 4-6 hours as needed for coughing/wheezing. - We will change you to ProAir RespiClick which does not need a spacer. - We will hold off on a daily inhaled medication at this time.  2. Chronic allergic rhinitis (grasses, weeds, trees, molds, dust mite, cat, and cockroach) - Continue with a nasal steroid spray such as Flonase, up to 2 sprays per nostril once daily. - Use an over-the-counter antihistamine as needed for breakthrough symptoms.  3. Adverse food reaction (shellfish)  - Victoria [name] is up to date.  - Continue to avoid for now. - She is introducing her other food intolerances at home at her own pace. - She can continue to do this without adverse event. -She does know that she should definitely avoid shellfish, however.   4. Return in about 6 months (around 07/08/2017).   Subjective:   Victoria [name] is a 56 y.o. female presenting today for follow up of  Chief Complaint  Patient presents with  . Allergic Rhinitis   . Asthma    Victoria [name] has a history of the following: Patient Active Problem List   Diagnosis Date Noted  . Class  1 obesity without serious comorbidity with body mass index (BMI) of 31.0 to 31.9 in adult 10/21/2016  . Chronic constipation 10/21/2016  . Seasonal and perennial allergic rhinitis 10/05/2007  . Asthma, mild intermittent, well-controlled 10/05/2007    History obtained from: chart review and patient.  Victoria [name] was referred by Victoria Kern, DO.     Victoria [name] is a 56yo female with a history of mild persistent asthma, chronic allergic rhinitis, chronic urticarial, and shellfish allergy presenting for a follow up appointment. She was last seen in November 2017 as a new patient (transfer from Dr. Annamaria [name]). We did do testing and she had positives to grasses, weeds, trees, molds, dust mite, cat, and cockroach. Food allergy testing as positive only to shellfish, but negative to a whole host of other foods. She had marked improvement on her spirometry and we continued her on Singulair 10mg  daily with albuterol as needed. She does have a history of eosinophilia (AEC 200 in March 2017), but she was asymptomatic.   Since the last visit, she has done well. She remains on her Singulair monotherapy. She did use her albuterol one week ago, but otherwise has not needed it. Normally she does not have to use that at all. Victoria [name]'s asthma has been well controlled. She has not required rescue medication, experienced nocturnal awakenings due to lower respiratory symptoms, nor have activities of daily living been limited. She has required no Emergency Department or Urgent Care visits for her asthma. She has required zero courses of systemic steroids for asthma exacerbations since the  last visit. ACT score today is 23, indicating excellent asthma symptom control. She feels that it is under better control than it has been "in her entire life".   Allergic rhinitis is controlled with PRN use of her antihistamines and fluticasone. Symptoms have been very well controlled. She continues to avoid all shellfish. With  regards to other foods, she will slowly introduce one to see if she can tolerate each one. Her reactions to the other foods were rather vague and not consistent with an IgE-mediated reaction. Her epinephrine injector is up to date.   She does have hive outbreaks very rarely, but she will treat them as needed with Benadryl. She does not tolerate antihistamines in general because they all seem to cause sedation.   Otherwise, there have been no changes to her past medical history, surgical history, family history, or social history.    Review of Systems: a 14-point review of systems is pertinent for what is mentioned in HPI.  Otherwise, all other systems were negative. Constitutional: negative other than that listed in the HPI Eyes: negative other than that listed in the HPI Ears, nose, mouth, throat, and face: negative other than that listed in the HPI Respiratory: negative other than that listed in the HPI Cardiovascular: negative other than that listed in the HPI Gastrointestinal: negative other than that listed in the HPI Genitourinary: negative other than that listed in the HPI Integument: negative other than that listed in the HPI Hematologic: negative other than that listed in the HPI Musculoskeletal: negative other than that listed in the HPI Neurological: negative other than that listed in the HPI Allergy/Immunologic: negative other than that listed in the HPI    Objective:   Blood pressure 120/82, pulse (!) 59, temperature 98.7 F (37.1 C), temperature source Oral, resp. rate 16, height 5' 4.5" (1.638 m), weight 182 lb 3.2 oz (82.6 kg), last menstrual period 03/30/2012, SpO2 98 %. Body mass index is 30.79 kg/m.   Physical Exam:  General: Alert, interactive, in no acute distress. Eyes: No conjunctival injection present on the right, No conjunctival injection present on the left, PERRL bilaterally, No discharge on the right, No discharge on the left and No Horner-Trantas dots  present Ears: Right TM pearly gray with normal light reflex, Left TM pearly gray with normal light reflex, Right TM intact without perforation and Left TM intact without perforation.  Nose/Throat: External nose within normal limits and septum midline, turbinates edematous and pale without discharge, post-pharynx mildly erythematous without cobblestoning in the posterior oropharynx. Tonsils 2+ without exudates Neck: Supple without thyromegaly. Lungs: Clear to auscultation without wheezing, rhonchi or rales. No increased work of breathing. CV: Normal S1/S2, no murmurs. Capillary refill <2 seconds.  Skin: Warm and dry, without lesions or rashes. Neuro:   Grossly intact. No focal deficits appreciated. Responsive to questions.   Diagnostic studies:   Spirometry: results normal (FEV1: 1.18/53%, FVC: 1.83/67%, FEV1/FVC: 64%).    Spirometry consistent with mixed obstructive and restrictive disease. Overall, her values are unchanged from her baseline spirometry obtained during her first visit. She did have reversibility noted at the first visit, therefore we will not do an albuterol nebulizer treatment today.  Allergy Studies: none    Salvatore Marvel, MD [facility]

## 2017-01-05 NOTE — Patient Instructions (Addendum)
1. Mild persistent asthma, uncomplicated - Lung testing looked similar to the last visit.  - Continue with Singulair 10mg  daily. - Use ProAir 4 puffs every 4-6 hours as needed for coughing/wheezing. - We will change you to ProAir RespiClick which does not need a spacer. - We will hold off on a daily inhaled medication at this time.  2. Chronic allergic rhinitis (grasses, weeds, trees, molds, dust mite, cat, and cockroach) - Continue with a nasal steroid spray such as Flonase, up to 2 sprays per nostril once daily. - Use an over-the-counter antihistamine as needed for breakthrough symptoms.  3. Adverse food reaction (shellfish)  - [name] is up to date.  - Continue to avoid for now.  4. Return in about 6 months (around 07/08/2017).  Please inform us of any Emergency Department visits, hospitalizations, or changes in symptoms. Call us before going to the ED for breathing or allergy symptoms since we might be able to fit you in for a sick visit. Feel free to contact us anytime with any questions, problems, or concerns.  It was a pleasure to see you again today!   Websites that have reliable patient information: 1. American Academy of Asthma, Allergy, and Immunology: www.aaaai.org 2. Food Allergy Research and Education (FARE): foodallergy.org 3. Mothers of Asthmatics: http://www.asthmacommunitynetwork.org 4. American College of Allergy, Asthma, and Immunology: www.acaai.org

## 2017-01-06 ENCOUNTER — Telehealth: Payer: Self-pay | Admitting: Family Medicine

## 2017-01-06 MED ORDER — HYDROCHLOROTHIAZIDE 25 MG PO TABS: 25.0000 mg | ORAL_TABLET | Freq: Every day | ORAL | 0 refills | Status: DC

## 2017-01-06 NOTE — Telephone Encounter (Signed)
Rx done. 

## 2017-01-06 NOTE — Telephone Encounter (Signed)
Pt need new rx hctz 25 mg #90 w/[facility]ills [facility]

## 2017-01-06 NOTE — Telephone Encounter (Signed)
Refill once OK. 

## 2017-01-12 ENCOUNTER — Encounter: Payer: Self-pay | Admitting: Family Medicine

## 2017-01-12 ENCOUNTER — Ambulatory Visit (INDEPENDENT_AMBULATORY_CARE_PROVIDER_SITE_OTHER): Payer: 59 | Admitting: Family Medicine

## 2017-01-12 VITALS — BP 100/60 | HR 56 | Temp 98.2°F | Ht 64.5 in | Wt 183.0 lb

## 2017-01-12 DIAGNOSIS — Z6830 Body mass index (BMI) 30.0-30.9, adult: Secondary | ICD-10-CM | POA: Diagnosis not present

## 2017-01-12 DIAGNOSIS — E538 Deficiency of other specified B group vitamins: Secondary | ICD-10-CM

## 2017-01-12 DIAGNOSIS — J4520 Mild intermittent asthma, uncomplicated: Secondary | ICD-10-CM | POA: Diagnosis not present

## 2017-01-12 DIAGNOSIS — I10 Essential (primary) hypertension: Secondary | ICD-10-CM | POA: Diagnosis not present

## 2017-01-12 DIAGNOSIS — R739 Hyperglycemia, unspecified: Secondary | ICD-10-CM | POA: Diagnosis not present

## 2017-01-12 NOTE — Progress Notes (Signed)
HPI: Follow up: PMH HTN, B12 def, hyperglycemia, obesity, IBS, asthma. Frustrated with weight. Doing 6000 steps daily recently. Admits there may be areas in diet she can work on. Does like salad and veggies. Avoids red meat. Taking the b12 daily. Did not see GI - ok if avoids certain foods. She opted not to see GI due to hassle of getting records. Declines further eval for now. No CP, SOB, DOe.  ROS: See pertinent positives and negatives per HPI.  Past Medical History:  Diagnosis Date  . Abnormal Pap smear of cervix   . Allergic rhinitis   . Asthma   . GERD (gastroesophageal reflux disease)   . Headache(784.0)    tension- not an issue  . Hypertension     Past Surgical History:  Procedure Laterality Date  . BALLOON DILATION N/A 11/19/2015   Procedure: BALLOON DILATION;  Surgeon: Garlan Fair, MD;  Location: [facility];  Service: Endoscopy;  Laterality: N/A;  . COLONOSCOPY WITH PROPOFOL N/A 10/05/2012   Procedure: COLONOSCOPY WITH PROPOFOL;  Surgeon: Garlan Fair, MD;  Location: [facility];  Service: Endoscopy;  Laterality: N/A;  . ESOPHAGOGASTRODUODENOSCOPY (EGD) WITH PROPOFOL N/A 11/19/2015   Procedure: ESOPHAGOGASTRODUODENOSCOPY (EGD) WITH PROPOFOL;  Surgeon: Garlan Fair, MD;  Location: [facility];  Service: Endoscopy;  Laterality: N/A;  . FOOT SURGERY Bilateral    bunionectomy- bilateral  . TUBAL LIGATION  27 years ago    Family History  Problem Relation Age of Onset  . Emphysema Father   . Lung cancer Father   . High Cholesterol Mother   . Osteoporosis Mother   . Hypertension Mother   . Asthma Son        exercise induced  . Migraines Daughter   . Hypertension Son   . Hypertension Sister     Social History   Social History  . Marital status: Married    Spouse name: N/A  . Number of children: 2  . Years of education: N/A   Occupational History  . 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 Smoking status: Never Smoker  .  Smokeless tobacco: Never Used  . Alcohol use No  . Drug use: No  . Sexual activity: Yes    Partners: Male    Birth control/ protection: Surgical, Post-menopausal     Comment: Tubal Ligation   Other Topics Concern  . None   Social History Narrative   Work or School: [employer] - full time, like job      Home Situation: lives with husband      Spiritual Beliefs: Christian - baptist      Lifestyle: no regular exercise, diet "bad"        Current Outpatient Prescriptions:  .  albuterol (PROVENTIL HFA) 108 (90 Base) MCG/ACT inhaler, Inhale two puffs every 4-6 hours if needed for cough or wheeze, Disp: 1 Inhaler, Rfl: 1 .  Albuterol Sulfate (PROAIR RESPICLICK) 865 (90 Base) MCG/ACT AEPB, Inhale 4 Doses into the lungs every 4 (four) hours as needed. For cough or wheeze, Disp: 1 each, Rfl: 1 .  cetirizine (ZYRTEC) 10 MG tablet, Take 10 mg by mouth as needed for allergies. , Disp: , Rfl:  .  Cholecalciferol (D3-1000) 1000 units tablet, Take 1,000 Units by mouth daily., Disp: , Rfl:  .  Cyanocobalamin (B-12) 2500 MCG TABS, Take 1 tablet by mouth daily., Disp: , Rfl:  .  diphenhydrAMINE (BENADRYL) 25 MG tablet, Take 25 mg by mouth at bedtime as  needed for allergies or sleep. , Disp: , Rfl:  .  docusate sodium (STOOL SOFTENER) 100 MG capsule, Take 100 mg by mouth daily., Disp: , Rfl:  .  EPINEPHrine (AUVI-Q IJ), Inject as directed., Disp: , Rfl:  .  fluticasone (FLONASE) 50 MCG/ACT nasal spray, Place 2 sprays into both nostrils daily as needed for allergies., Disp: 16 g, Rfl: 5 .  hydrochlorothiazide (HYDRODIURIL) 25 MG tablet, Take 1 tablet (25 mg total) by mouth daily., Disp: 90 tablet, Rfl: 0 .  montelukast (SINGULAIR) 10 MG tablet, Take 1 tablet (10 mg total) by mouth at bedtime., Disp: 90 tablet, Rfl: 1 .  polyethylene glycol (MIRALAX / GLYCOLAX) packet, Take 17 g by mouth daily., Disp: , Rfl:   EXAM:  Vitals:   01/12/17 1533  BP: 100/60  Pulse: (!) 56  Temp: 98.2 F (36.8  C)    Body mass index is 30.93 kg/m.  GENERAL: vitals reviewed and listed above, alert, oriented, appears well hydrated and in no acute distress  HEENT: atraumatic, conjunttiva clear, no obvious abnormalities on inspection of external nose and ears  NECK: no obvious masses on inspection  LUNGS: clear to auscultation bilaterally, no wheezes, rales or rhonchi, good air movement  CV: HRRR, no peripheral edema  MS: moves all extremities without noticeable abnormality  PSYCH: pleasant and cooperative, no obvious depression or anxiety  ASSESSMENT AND PLAN:  Discussed the following assessment and plan:  Essential hypertension - Plan: Basic metabolic panel, CBC  BMI 71.6-96.7,ELFYB  B12 deficiency - Plan: Vitamin B12  Hyperglycemia  Asthma, mild intermittent, well-controlled  -labs, lifestyle recs discussed at length -follow up in 3-4 months -Patient advised to return or notify a doctor immediately if symptoms worsen or persist or new concerns arise.  Patient Instructions  BEFORE YOU LEAVE: -follow up: 3-4 months -labs  We have ordered labs or studies at this visit. It can take up to 1-2 weeks for results and processing. IF results require follow up or explanation, we will call you with instructions. Clinically stable results will be released to your [facility]. If you have not heard from [place] or cannot find your results in [facility] in 2 weeks please contact our office at [phone].  If you are not yet signed up for [facility], please consider signing up.   We recommend the following healthy lifestyle for LIFE: 1) Small portions.   Tip: eat off of a salad plate instead of a dinner plate.  Tip: It is ok to feel hungry after a meal - that likely means you ate an appropriate portion.  Tip: if you need more or a snack choose fruits, veggies and/or a handful of nuts or seeds.  2) Eat a healthy clean diet.   TRY TO EAT: -at least 5-7 servings of low sugar vegetables per day (not  corn, potatoes or bananas.) -berries are the best choice if you wish to eat fruit.   -lean meets (fish, chicken or [place] breasts) -vegan proteins for some meals - beans or tofu, whole grains, nuts and seeds -Replace bad fats with good fats - good fats include: fish, nuts and seeds, canola oil, olive oil -small amounts of low fat or non fat dairy -small amounts of100 % whole grains - check the lables  AVOID: -SUGAR, sweets, anything with added sugar, corn syrup or sweeteners -if you must have a sweetener, small amounts of stevia may be best -sweetened beverages -simple starches (rice, bread, potatoes, pasta, chips, etc - small amounts of 100% whole grains  are ok) -red meat, pork, butter -fried foods, fast food, processed food, excessive dairy, eggs and coconut.  3)Get at least 150 minutes of sweaty aerobic exercise per week.  4)Reduce stress - consider counseling, meditation and relaxation to balance other aspects of your life.           Colin Benton R., DO

## 2017-01-12 NOTE — Patient Instructions (Signed)
BEFORE YOU LEAVE: -follow up: 3-4 months -labs  We have ordered labs or studies at this visit. It can take up to 1-2 weeks for results and processing. IF results require follow up or explanation, we will call you with instructions. Clinically stable results will be released to your [facility]. If you have not heard from [place] or cannot find your results in [facility] in 2 weeks please contact our office at [phone].  If you are not yet signed up for [facility], please consider signing up.   We recommend the following healthy lifestyle for LIFE: 1) Small portions.   Tip: eat off of a salad plate instead of a dinner plate.  Tip: It is ok to feel hungry after a meal - that likely means you ate an appropriate portion.  Tip: if you need more or a snack choose fruits, veggies and/or a handful of nuts or seeds.  2) Eat a healthy clean diet.   TRY TO EAT: -at least 5-7 servings of low sugar vegetables per day (not corn, potatoes or bananas.) -berries are the best choice if you wish to eat fruit.   -lean meets (fish, chicken or [place] breasts) -vegan proteins for some meals - beans or tofu, whole grains, nuts and seeds -Replace bad fats with good fats - good fats include: fish, nuts and seeds, canola oil, olive oil -small amounts of low fat or non fat dairy -small amounts of100 % whole grains - check the lables  AVOID: -SUGAR, sweets, anything with added sugar, corn syrup or sweeteners -if you must have a sweetener, small amounts of stevia may be best -sweetened beverages -simple starches (rice, bread, potatoes, pasta, chips, etc - small amounts of 100% whole grains are ok) -red meat, pork, butter -fried foods, fast food, processed food, excessive dairy, eggs and coconut.  3)Get at least 150 minutes of sweaty aerobic exercise per week.  4)Reduce stress - consider counseling, meditation and relaxation to balance other aspects of your life.

## 2017-01-13 LAB — CBC
HCT: 37.8 % (ref 36.0–46.0)
Hemoglobin: 12.6 g/dL (ref 12.0–15.0)
MCHC: 33.2 g/dL (ref 30.0–36.0)
MCV: 85.5 fl (ref 78.0–100.0)
Platelets: 226 10*3/uL (ref 150.0–400.0)
RBC: 4.42 Mil/uL (ref 3.87–5.11)
RDW: 15 % (ref 11.5–15.5)
WBC: 5.2 10*3/uL (ref 4.0–10.5)

## 2017-01-13 LAB — BASIC METABOLIC PANEL
BUN: 12 mg/dL (ref 6–23)
CALCIUM: 9.6 mg/dL (ref 8.4–10.5)
CO2: 32 meq/L (ref 19–32)
CREATININE: 0.76 mg/dL (ref 0.40–1.20)
Chloride: 99 mEq/L (ref 96–112)
GFR: 101.83 mL/min (ref >60.00)
Glucose, Bld: 86 mg/dL (ref 70–99)
Potassium: 3.2 mEq/L — ABNORMAL LOW (ref 3.5–5.1)
Sodium: 140 mEq/L (ref 135–145)

## 2017-01-13 LAB — VITAMIN B12: VITAMIN B 12: 771 pg/mL (ref 211–911)

## 2017-01-21 ENCOUNTER — Encounter: Payer: Self-pay | Admitting: Gastroenterology

## 2017-03-04 ENCOUNTER — Encounter: Payer: Self-pay | Admitting: Family Medicine

## 2017-03-04 ENCOUNTER — Ambulatory Visit (INDEPENDENT_AMBULATORY_CARE_PROVIDER_SITE_OTHER): Payer: 59 | Admitting: Family Medicine

## 2017-03-04 VITALS — BP 110/70 | HR 58 | Temp 99.1°F | Wt 185.1 lb

## 2017-03-04 DIAGNOSIS — J302 Other seasonal allergic rhinitis: Secondary | ICD-10-CM | POA: Diagnosis not present

## 2017-03-04 MED ORDER — AZELASTINE HCL 0.1 % NA SOLN: 2.0000 | Freq: Two times a day (BID) | NASAL | 12 refills | Status: DC

## 2017-03-04 NOTE — Progress Notes (Signed)
Subjective:     Patient ID: Victoria [name], female   DOB: 11/06/1960, 56 y.o.   MRN: [id number]  HPI Patient seen as a work in today with 3 day history of some sinus pressure, sore throat, nonproductive cough, and postnasal drip symptoms. She thinks this may be allergy related. She has fall allergies frequently. takes several things for allergies including Singulair, Zyrtec, and Flonase.  Denies any fever or chills. No localizing facial pain. No purulent secretions.  Past Medical History:  Diagnosis Date  . Abnormal Pap smear of cervix   . Allergic rhinitis   . Asthma   . GERD (gastroesophageal reflux disease)   . Headache(784.0)    tension- not an issue  . Hypertension    Past Surgical History:  Procedure Laterality Date  . BALLOON DILATION N/A 11/19/2015   Procedure: BALLOON DILATION;  Surgeon: Garlan Fair, MD;  Location: [facility];  Service: Endoscopy;  Laterality: N/A;  . COLONOSCOPY WITH PROPOFOL N/A 10/05/2012   Procedure: COLONOSCOPY WITH PROPOFOL;  Surgeon: Garlan Fair, MD;  Location: [facility];  Service: Endoscopy;  Laterality: N/A;  . ESOPHAGOGASTRODUODENOSCOPY (EGD) WITH PROPOFOL N/A 11/19/2015   Procedure: ESOPHAGOGASTRODUODENOSCOPY (EGD) WITH PROPOFOL;  Surgeon: Garlan Fair, MD;  Location: [facility];  Service: Endoscopy;  Laterality: N/A;  . FOOT SURGERY Bilateral    bunionectomy- bilateral  . TUBAL LIGATION  27 years ago    reports that she has never smoked. She has never used smokeless tobacco. She reports that she does not drink alcohol or use drugs. family history includes Asthma in her son; Emphysema in her father; High Cholesterol in her mother; Hypertension in her mother, sister, and son; Lung cancer in her father; Migraines in her daughter; Osteoporosis in her mother. Allergies  Allergen Reactions  . Shellfish Allergy Shortness Of Breath and Itching  . Sulfonamide Derivatives Itching, Swelling and Other (See Comments)    Throat swells   . Nasonex [Mometasone Furoate] Other (See Comments)    Nose bleeds     Review of Systems  Constitutional: Negative for chills and fever.  HENT: Positive for congestion, postnasal drip, rhinorrhea, sinus pressure and sore throat. Negative for nosebleeds.   Respiratory: Positive for cough.        Objective:   Physical Exam  Constitutional: She appears well-developed and well-nourished.  HENT:  Right Ear: External ear normal.  Left Ear: External ear normal.  Mouth/Throat: Oropharynx is clear and moist. No oropharyngeal exudate.  Neck: Neck supple.  Cardiovascular: Normal rate and regular rhythm.   Pulmonary/Chest: Effort normal and breath sounds normal. No respiratory distress. She has no wheezes. She has no rales.  Lymphadenopathy:    She has no cervical adenopathy.       Assessment:     Rhinitis. Suspect allergic rhinitis    Plan:     -Add Astelin nasal 1-2 sprays per nostril twice daily as needed -Continue with her Singulair and Flonase -Follow-up for any fever, persistent symptoms, or any change of symptoms  Eulas Post MD [facility]

## 2017-03-04 NOTE — Patient Instructions (Signed)
Allergic Rhinitis Allergic rhinitis is when the mucous membranes in the nose respond to allergens. Allergens are particles in the air that cause your body to have an allergic reaction. This causes you to release allergic antibodies. Through a chain of events, these eventually cause you to release histamine into the blood stream. Although meant to protect the body, it is this release of histamine that causes your discomfort, such as frequent sneezing, congestion, and an itchy, runny nose. What are the causes? Seasonal allergic rhinitis (hay fever) is caused by pollen allergens that may come from grasses, trees, and weeds. Year-round allergic rhinitis (perennial allergic rhinitis) is caused by allergens such as house dust mites, pet dander, and mold spores. What are the signs or symptoms?  Nasal stuffiness (congestion).  Itchy, runny nose with sneezing and tearing of the eyes. How is this diagnosed? Your health care provider can help you determine the allergen or allergens that trigger your symptoms. If you and your health care provider are unable to determine the allergen, skin or blood testing may be used. Your health care provider will diagnose your condition after taking your health history and performing a physical exam. Your health care provider may assess you for other related conditions, such as asthma, pink eye, or an ear infection. How is this treated? Allergic rhinitis does not have a cure, but it can be controlled by:  Medicines that block allergy symptoms. These may include allergy shots, nasal sprays, and oral antihistamines.  Avoiding the allergen. Hay fever may often be treated with antihistamines in pill or nasal spray forms. Antihistamines block the effects of histamine. There are over-the-counter medicines that may help with nasal congestion and swelling around the eyes. Check with your health care provider before taking or giving this medicine. If avoiding the allergen or the  medicine prescribed do not work, there are many new medicines your health care provider can prescribe. Stronger medicine may be used if initial measures are ineffective. Desensitizing injections can be used if medicine and avoidance does not work. Desensitization is when a patient is given ongoing shots until the body becomes less sensitive to the allergen. Make sure you follow up with your health care provider if problems continue. Follow these instructions at home: It is not possible to completely avoid allergens, but you can reduce your symptoms by taking steps to limit your exposure to them. It helps to know exactly what you are allergic to so that you can avoid your specific triggers. Contact a health care provider if:  You have a fever.  You develop a cough that does not stop easily (persistent).  You have shortness of breath.  You start wheezing.  Symptoms interfere with normal daily activities. This information is not intended to replace advice given to you by your health care provider. Make sure you discuss any questions you have with your health care provider. Document Released: 03/11/2001 Document Revised: 02/15/2016 Document Reviewed: 02/21/2013 Elsevier Interactive Patient Education  2017 [employer]  

## 2017-03-13 ENCOUNTER — Ambulatory Visit: Payer: 59 | Admitting: Gastroenterology

## 2017-03-13 ENCOUNTER — Encounter: Payer: Self-pay | Admitting: Gastroenterology

## 2017-03-13 ENCOUNTER — Ambulatory Visit (INDEPENDENT_AMBULATORY_CARE_PROVIDER_SITE_OTHER): Payer: 59 | Admitting: Gastroenterology

## 2017-03-13 VITALS — BP 138/88 | HR 60 | Ht 64.0 in | Wt 182.5 lb

## 2017-03-13 DIAGNOSIS — R140 Abdominal distension (gaseous): Secondary | ICD-10-CM | POA: Diagnosis not present

## 2017-03-13 DIAGNOSIS — K5900 Constipation, unspecified: Secondary | ICD-10-CM | POA: Diagnosis not present

## 2017-03-13 NOTE — Progress Notes (Signed)
HPI: This is a very pleasant 56 year old woman  who was referred to me by Lucretia Kern, DO  to evaluate  chronic abdominal pain, chronic constipation, chronic bloating .    Chief complaint is abdominal discomfort, bloating, constipation.  Several years of abdominal problems.  Centeral abdominal pains, especially after meals but mostly they are constant.  She says the pains feel like a bloating sensation. Some foods make it worse: such as carbs.  She tries to cut back on these and actually cut out red meat from her diet recently.  She gets heartburn occaisionally, varies with meals.  Spicey foods the worst.  Upper GI, barium esophagram 2017 showed significant reflux  Constipation, if she takes miralax daily she feels better.  Her weight has been stable  No overt GI bleeding.  Old Data Reviewed: Upper endoscopy May 2017 with Dr. Howell Rucks. Indications dysphagia. 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 He took biopsies from her esophagus to check for eosinophilic esophagitis and pathology did not show an extra eosinophil presence.  Barium esophogram 08/2015 for dysphagia showed smooth GE junction narrowing. Labs 2018: normal CBC Labs 2017: tTG normal, total IgA normal [place] 08/2015: for abdominal pain; normal GB; possibly fatty liver Colonoscopy 2014 Dr. Wynetta Emery: screening: report not available at the time of this visit.  This was normal.       Review of systems: Pertinent positive and negative review of systems were noted in the above HPI section. All other review negative.   Past Medical History:  Diagnosis Date  . Abnormal Pap smear of cervix   . Allergic rhinitis   . Asthma   . GERD (gastroesophageal reflux disease)   . Headache(784.0)    tension- not an issue  . Hypertension     Past Surgical History:  Procedure Laterality Date  . BALLOON DILATION N/A 11/19/2015   Procedure: BALLOON DILATION;  Surgeon: Garlan Fair, MD;  Location: [facility];  Service: Endoscopy;  Laterality: N/A;  . BUNIONECTOMY Bilateral   . COLONOSCOPY WITH PROPOFOL N/A 10/05/2012   Procedure: COLONOSCOPY WITH PROPOFOL;  Surgeon: Garlan Fair, MD;  Location: [facility];  Service: Endoscopy;  Laterality: N/A;  . ESOPHAGOGASTRODUODENOSCOPY (EGD) WITH PROPOFOL N/A 11/19/2015   Procedure: ESOPHAGOGASTRODUODENOSCOPY (EGD) WITH PROPOFOL;  Surgeon: Garlan Fair, MD;  Location: [facility];  Service: Endoscopy;  Laterality: N/A;  . TUBAL LIGATION  27 years ago    Current Outpatient Prescriptions  Medication Sig Dispense Refill  . albuterol (PROVENTIL HFA) 108 (90 Base) MCG/ACT inhaler Inhale two puffs every 4-6 hours if needed for cough or wheeze 1 Inhaler 1  . Albuterol Sulfate (PROAIR RESPICLICK) 381 (90 Base) MCG/ACT AEPB Inhale 4 Doses into the lungs every 4 (four) hours as needed. For cough or wheeze 1 each 1  . azelastine (ASTELIN) 0.1 % nasal spray Place 2 sprays into both nostrils 2 (two) times daily. Use in each nostril as directed 30 mL 12  . cetirizine (ZYRTEC) 10 MG tablet Take 10 mg by mouth as needed for allergies.     . Cholecalciferol (D3-1000) 1000 units tablet Take 1,000 Units by mouth daily.    . Cyanocobalamin (B-12) 2500 MCG TABS Take 1 tablet by mouth daily.    . diphenhydrAMINE (BENADRYL) 25 MG tablet Take 25 mg by mouth at bedtime as needed for allergies or sleep.     Marland Kitchen docusate sodium (STOOL SOFTENER) 100 MG capsule Take 100 mg by mouth daily.    Marland Kitchen  EPINEPHrine (AUVI-Q IJ) Inject as directed.    . fluticasone (FLONASE) 50 MCG/ACT nasal spray Place 2 sprays into both nostrils daily as needed for allergies. 16 g 5  . hydrochlorothiazide (HYDRODIURIL) 25 MG tablet Take 1 tablet (25 mg total) by mouth daily. 90 tablet 0  . montelukast (SINGULAIR) 10 MG tablet Take 1 tablet (10 mg total) by mouth at bedtime. 90 tablet 1  . polyethylene glycol (MIRALAX / GLYCOLAX) packet Take 17 g by mouth daily.     No current  facility-administered medications for this visit.     Allergies as of 03/13/2017 - Review Complete 03/13/2017  Allergen Reaction Noted  . Shellfish allergy Shortness Of Breath and Itching 01/26/2015  . Sulfonamide derivatives Itching, Swelling, and Other (See Comments)   . Nasonex [mometasone furoate] Other (See Comments) 08/11/2014    Family History  Problem Relation Age of Onset  . Emphysema Father   . Lung cancer Father   . High Cholesterol Mother   . Osteoporosis Mother   . Hypertension Mother   . Migraines Daughter   . Hypertension Son        exercise  . Asthma Son   . Hypertension Sister     Social History   Social History  . Marital status: Married    Spouse name: N/A  . Number of children: 2  . Years of education: N/A   Occupational History  . 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 Smoking status: Never Smoker  . Smokeless tobacco: Never Used  . Alcohol use No  . Drug use: No  . Sexual activity: Yes    Partners: Male    Birth control/ protection: Surgical, Post-menopausal     Comment: Tubal Ligation   Other Topics Concern  . Not on file   Social History Narrative   Work or School: [employer] - full time, like job      Home Situation: lives with husband      Spiritual Beliefs: Christian - baptist      Lifestyle: no regular exercise, diet "bad"        Physical Exam: Ht 5\' 4"  (1.626 m) Comment: height measured without shoes  Wt 182 lb 8 oz (82.8 kg)   LMP 03/30/2012   BMI 31.33 kg/m  Constitutional: generally well-appearing Psychiatric: alert and oriented x3 Eyes: extraocular movements intact Mouth: oral pharynx moist, no lesions Neck: supple no lymphadenopathy Cardiovascular: heart regular rate and rhythm Lungs: clear to auscultation bilaterally Abdomen: soft, nontender, nondistended, no obvious ascites, no peritoneal signs, normal bowel sounds Extremities: no lower extremity edema bilaterally Skin: no  lesions on visible extremities   Assessment and plan: 56 y.o. female with  Chronic bloating, abdominal discomforts, constipation  I think her symptoms are likely functional. They have been going on for many years. She had upper endoscopy last year, ultrasound last year. Celiac sprue testing in the past year or so. Labs recently show she is not anemic. She does not have any further dysphagia after esophageal dilation about a year ago. She is not having any overt GI bleeding. I see no reason to repeat upper endoscopy or colonoscopy at this point. She does get heartburn occasionally and a barium esophagram about a year ago suggested significant gastric reflux. Perhaps some her bloating is related to reflux as an atypical symptom. I recommended a trial of twice daily H2 blockers for now and she will call to report on her response to this medicine in for  5 weeks.    Please see the "Patient Instructions" section for addition details about the plan.   Owens Loffler, MD [facility] 03/13/2017, 8:23 AM  Cc: Lucretia Kern, DO

## 2017-03-13 NOTE — Patient Instructions (Addendum)
We will get records sent from your previous gastroenterologist (Dr. Wynetta Emery 2014 colonoscopy report with pathology) for review.     Ranitidine 150mg  pill (OTC), one pill at bedtime nightly and one pill in AM with breakfast.  Call in 4-5 week to report on your response.  Normal BMI (Body Mass Index- based on height and weight) is between 19 and 25. Your BMI today is Body mass index is 31.33 kg/m. Marland Kitchen Please consider follow up  regarding your BMI with your Primary Care Provider.

## 2017-03-17 ENCOUNTER — Telehealth: Payer: Self-pay | Admitting: Gastroenterology

## 2017-03-17 NOTE — Telephone Encounter (Signed)
Recall in EPIC 

## 2017-03-17 NOTE — Telephone Encounter (Signed)
Colonoscopy Dr. Howell Rucks April 2014 done for screening. Findings "normal baseline screening proctocolonoscopy to the cecum".   She needs recall colonoscopy April 2024.

## 2017-03-19 ENCOUNTER — Encounter: Payer: Self-pay | Admitting: Family Medicine

## 2017-03-20 ENCOUNTER — Encounter (HOSPITAL_COMMUNITY): Payer: Self-pay | Admitting: *Deleted

## 2017-03-20 ENCOUNTER — Emergency Department (HOSPITAL_COMMUNITY): Payer: 59

## 2017-03-20 ENCOUNTER — Emergency Department (HOSPITAL_COMMUNITY)
Admission: EM | Admit: 2017-03-20 | Discharge: 2017-03-20 | Disposition: A | Discharge status: Home / Self Care | Payer: 59 | Attending: Emergency Medicine | Admitting: Emergency Medicine

## 2017-03-20 DIAGNOSIS — R110 Nausea: Secondary | ICD-10-CM | POA: Insufficient documentation

## 2017-03-20 DIAGNOSIS — R51 Headache: Secondary | ICD-10-CM | POA: Insufficient documentation

## 2017-03-20 DIAGNOSIS — Z79899 Other long term (current) drug therapy: Secondary | ICD-10-CM | POA: Insufficient documentation

## 2017-03-20 DIAGNOSIS — J45909 Unspecified asthma, uncomplicated: Secondary | ICD-10-CM | POA: Insufficient documentation

## 2017-03-20 DIAGNOSIS — I10 Essential (primary) hypertension: Secondary | ICD-10-CM | POA: Insufficient documentation

## 2017-03-20 DIAGNOSIS — R519 Headache, unspecified: Secondary | ICD-10-CM

## 2017-03-20 LAB — I-STAT CHEM 8, ED
BUN: 15 mg/dL (ref 6–20)
Calcium, Ion: 1.21 mmol/L (ref 1.15–1.40)
Chloride: 102 mmol/L (ref 101–111)
Creatinine, Ser: 0.8 mg/dL (ref 0.44–1.00)
GLUCOSE: 103 mg/dL — AB (ref 65–99)
HEMATOCRIT: 39 % (ref 36.0–46.0)
Hemoglobin: 13.3 g/dL (ref 12.0–15.0)
Potassium: 4 mmol/L (ref 3.5–5.1)
Sodium: 140 mmol/L (ref 135–145)
TCO2: 29 mmol/L (ref 22–32)

## 2017-03-20 LAB — I-STAT BETA HCG BLOOD, ED (MC, WL, AP ONLY): I-stat hCG, quantitative: <5 m[IU]/mL (ref <5)

## 2017-03-20 MED ORDER — IOPAMIDOL (ISOVUE-370) INJECTION 76%
INTRAVENOUS | Status: AC
  Administered 2017-03-20: 50 mL
  Filled 2017-03-20: qty 50

## 2017-03-20 NOTE — ED Notes (Signed)
Pt ambulated to room from waiting area. Pt had no complaints while ambulating. Pt assisted into gown an placed on bp and o2 monitor.

## 2017-03-20 NOTE — ED Provider Notes (Signed)
[facility] DEPT Provider Note   CSN: [id number] Arrival date & time: 03/20/17  1062     History   Chief Complaint Chief Complaint  Patient presents with  . Headache   HPI   Blood pressure 116/76, pulse 61, temperature 98.2 F (36.8 C), temperature source Oral, resp. rate 17, last menstrual period 03/30/2012, SpO2 95 %.  Victoria [name] is a 56 y.o. female complaining of Severe right periorbital headache onset this morning at approximately 5:30 AM, not thunderclap in nature, it became 9 out of 10 severity indolently, lasted for several hours and spontaneously resolved to 3 out of 10 right now. She took acetaminophen with little relief. She is photosensitive with nausea. She describes as throbbing. No emesis, fever, cervicalgia. She uses nasal spray because she thought it might be sinusitis with little relief. She doesn't typically have a problem with headaches however chart review shows diagnosis of tension headache in the past. She states that although she doesn't have any active neck pain right now when the pain was severe she did have neck pain.  Past Medical History:  Diagnosis Date  . Abnormal Pap smear of cervix   . Allergic rhinitis   . Asthma   . GERD (gastroesophageal reflux disease)   . Headache(784.0)    tension- not an issue  . Hypertension     Patient Active Problem List   Diagnosis Date Noted  . Class 1 obesity without serious comorbidity with body mass index (BMI) of 31.0 to 31.9 in adult 10/21/2016  . Chronic constipation 10/21/2016  . Seasonal and perennial allergic rhinitis 10/05/2007  . Asthma, mild intermittent, well-controlled 10/05/2007    Past Surgical History:  Procedure Laterality Date  . BALLOON DILATION N/A 11/19/2015   Procedure: BALLOON DILATION;  Surgeon: Garlan Fair, MD;  Location: [facility];  Service: Endoscopy;  Laterality: N/A;  . BUNIONECTOMY Bilateral   . COLONOSCOPY WITH PROPOFOL N/A 10/05/2012   Procedure: COLONOSCOPY  WITH PROPOFOL;  Surgeon: Garlan Fair, MD;  Location: [facility];  Service: Endoscopy;  Laterality: N/A;  . ESOPHAGOGASTRODUODENOSCOPY (EGD) WITH PROPOFOL N/A 11/19/2015   Procedure: ESOPHAGOGASTRODUODENOSCOPY (EGD) WITH PROPOFOL;  Surgeon: Garlan Fair, MD;  Location: [facility];  Service: Endoscopy;  Laterality: N/A;  . TUBAL LIGATION  27 years ago    OB History    Gravida Para Term Preterm AB Living   2 2 2     2    SAB TAB Ectopic Multiple Live Births           1       Home Medications    Prior to Admission medications   Medication Sig Start Date End Date Taking? Authorizing Provider  albuterol (PROVENTIL HFA) 108 (90 Base) MCG/ACT inhaler Inhale two puffs every 4-6 hours if needed for cough or wheeze 06/04/16   Valentina Shaggy, MD  Albuterol Sulfate (PROAIR RESPICLICK) 694 (90 Base) MCG/ACT AEPB Inhale 4 Doses into the lungs every 4 (four) hours as needed. For cough or wheeze 01/05/17   Valentina Shaggy, MD  azelastine (ASTELIN) 0.1 % nasal spray Place 2 sprays into both nostrils 2 (two) times daily. Use in each nostril as directed 03/04/17   Burchette, Alinda Sierras, MD  cetirizine (ZYRTEC) 10 MG tablet Take 10 mg by mouth as needed for allergies.     [provider]  Cholecalciferol (D3-1000) 1000 units tablet Take 1,000 Units by mouth daily.    [provider]  Cyanocobalamin (B-12) 2500 MCG TABS Take 1  tablet by mouth daily.    [provider]  diphenhydrAMINE (BENADRYL) 25 MG tablet Take 25 mg by mouth at bedtime as needed for allergies or sleep.     [provider]  docusate sodium (STOOL SOFTENER) 100 MG capsule Take 100 mg by mouth daily.    [provider]  EPINEPHrine (AUVI-Q IJ) Inject as directed.    [provider]  fluticasone (FLONASE) 50 MCG/ACT nasal spray Place 2 sprays into both nostrils daily as needed for allergies. 04/30/16   Valentina Shaggy, MD  hydrochlorothiazide (HYDRODIURIL) 25 MG tablet  Take 1 tablet (25 mg total) by mouth daily. 01/06/17   Burchette, Alinda Sierras, MD  montelukast (SINGULAIR) 10 MG tablet Take 1 tablet (10 mg total) by mouth at bedtime. 01/05/17   Valentina Shaggy, MD  polyethylene glycol [facility] Floria Raveling) packet Take 17 g by mouth daily.    [provider]    Family History Family History  Problem Relation Age of Onset  . Emphysema Father   . Lung cancer Father   . High Cholesterol Mother   . Osteoporosis Mother   . Hypertension Mother   . Migraines Daughter   . Hypertension Son        exercise  . Asthma Son   . Hypertension Sister     Social History Social History  Substance Use Topics  . Smoking status: Never Smoker  . Smokeless tobacco: Never Used  . Alcohol use No     Allergies   Shellfish allergy; Sulfonamide derivatives; and Nasonex [mometasone furoate]   Review of Systems Review of Systems  A complete review of systems was obtained and all systems are negative except as noted in the HPI and PMH.    Physical Exam Updated Vital Signs BP (!) 144/82 (BP Location: Right Arm)   Pulse (!) 51   Temp 98.2 F (36.8 C) (Oral)   Resp 16   LMP 03/30/2012   SpO2 96%   Physical Exam  Constitutional: She is oriented to person, place, and time. She appears well-developed and well-nourished.  HENT:  Head: Normocephalic and atraumatic.  Mouth/Throat: Oropharynx is clear and moist.  Eyes: Pupils are equal, round, and reactive to light. Conjunctivae and EOM are normal.  No TTP of maxillary or frontal sinuses  No TTP or induration of temporal arteries bilaterally  Neck: Normal range of motion. Neck supple.  FROM to C-spine. Pt can touch chin to chest without discomfort. No TTP of midline cervical spine.   Cardiovascular: Normal rate, regular rhythm and intact distal pulses.   Pulmonary/Chest: Effort normal and breath sounds normal. No respiratory distress. She has no wheezes. She has no rales. She exhibits no tenderness.    Abdominal: Soft. Bowel sounds are normal. There is no tenderness.  Musculoskeletal: Normal range of motion. She exhibits no edema or tenderness.  Neurological: She is alert and oriented to person, place, and time. No cranial nerve deficit.  II-Visual fields grossly intact. III/IV/VI-Extraocular movements intact.  Pupils reactive bilaterally. V/VII-Smile symmetric, equal eyebrow raise,  facial sensation intact VIII- Hearing grossly intact IX/X-Normal gag XI-bilateral shoulder shrug XII-midline tongue extension Motor: 5/5 bilaterally with normal tone and bulk Cerebellar: Normal finger-to-nose  and normal heel-to-shin test.   Romberg negative Ambulates with a coordinated gait   Nursing note and vitals reviewed.    ED Treatments / Results  Labs (all labs ordered are listed, but only abnormal results are displayed) Labs Reviewed  I-STAT CHEM 8, ED - Abnormal; Notable  for the following:       Result Value   Glucose, Bld 103 (*)    All other components within normal limits  I-STAT BETA HCG BLOOD, ED ([facility], [facility], [facility] ONLY)    EKG  EKG Interpretation None       Radiology Ct Angio Head W Or Wo Contrast  Result Date: 03/20/2017 CLINICAL DATA:  Headache, acute, severe, worst of life. EXAM: CT ANGIOGRAPHY HEAD TECHNIQUE: Multidetector CT imaging of the head was performed using the standard protocol during bolus administration of intravenous contrast. Multiplanar CT image reconstructions and MIPs were obtained to evaluate the vascular anatomy. CONTRAST:  50 cc Isovue 370 intravenous COMPARISON:  None. FINDINGS: CT HEAD Brain: No evidence of infarction, hemorrhage, hydrocephalus, extra-axial collection or mass lesion/mass effect. 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 No osseous abnormality. Sinuses: Negative Orbits: Negative CTA HEAD Anterior circulation: Both distal cervical ICAs are beaded, consistent with fibromuscular dysplasia. No intracranial  stenosis, beading, or aneurysm. No intracranial atheromatous changes. Posterior circulation: Mild right vertebral artery dominance. 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 Vessels are smooth and diffusely patent. Negative for aneurysm. Venous sinuses: Patent Anatomic variants: Bilateral fetal type PCA Delayed phase: No abnormal intracranial enhancement. IMPRESSION: 1. No acute intracranial finding.  Negative for cerebral aneurysm. 2. 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 Electronically Signed   By: Monte Fantasia M.D.   On: 03/20/2017 15:35    Procedures Procedures (including critical care time)  Medications Ordered in ED Medications  iopamidol (ISOVUE-370) 76 % injection (50 mLs  Contrast Given 03/20/17 1459)     Initial Impression / Assessment and Plan / ED Course  I have reviewed the triage vital signs and the nursing notes.  Pertinent labs & imaging results that were available during my care of the patient were reviewed by me and considered in my medical decision making (see chart for details).     Vitals:   03/20/17 1223 03/20/17 1245 03/20/17 1330 03/20/17 1553  BP: 116/76 113/80 135/89 (!) 144/82  Pulse: 61 62 (!) 57 (!) 51  Resp: 17  17 16   Temp: 98.2 F (36.8 C)     TempSrc: Oral     SpO2: 95% 97% 99% 96%    Medications  iopamidol (ISOVUE-370) 76 % injection (50 mLs  Contrast Given 03/20/17 1459)    Victoria [name] is 56 y.o. female presenting with atypical but improving headache. Nonfocal neurologic exam. Given the combination of severity and neck pain will evaluate for subarachnoid. Patient declines pain medication now.  CTA negative, updated patient was comfortable with discharge to home and follow-up with primary care.  Evaluation does not show pathology that would require ongoing emergent intervention or inpatient treatment. Pt is hemodynamically stable and mentating appropriately. Discussed findings and plan with  patient/guardian, who agrees with care plan. All questions answered. Return precautions discussed and outpatient follow up given.      Final Clinical Impressions(s) / ED Diagnoses   Final diagnoses:  Nonintractable headache, unspecified chronicity pattern, unspecified headache type    New Prescriptions Discharge Medication List as of 03/20/2017  3:48 PM       Laverne Hursey, Charna Elizabeth 03/21/17 0654    Drenda Freeze, MD 03/21/17 [phone]

## 2017-03-20 NOTE — Discharge Instructions (Signed)
Please follow with your primary care doctor in the next 2 days for a check-up. They must obtain records for further management.  ° °Do not hesitate to return to the Emergency Department for any new, worsening or concerning symptoms.  ° °

## 2017-03-20 NOTE — ED Triage Notes (Signed)
Pt here with severe headache on right side of head and behind eye since last night and has some nausea.Pt denies vision changes.  Pt states that she has high blood pressure and took medications today.  No blood thinners and states this has happened before.

## 2017-03-30 ENCOUNTER — Other Ambulatory Visit: Payer: Self-pay | Admitting: Family Medicine

## 2017-03-30 DIAGNOSIS — Z1231 Encounter for screening mammogram for malignant neoplasm of breast: Secondary | ICD-10-CM

## 2017-04-06 ENCOUNTER — Other Ambulatory Visit: Payer: Self-pay | Admitting: Family Medicine

## 2017-04-16 ENCOUNTER — Other Ambulatory Visit (HOSPITAL_COMMUNITY)
Admission: RE | Admit: 2017-04-16 | Discharge: 2017-04-16 | Disposition: A | Discharge status: Home / Self Care | Payer: 59 | Source: Ambulatory Visit | Attending: Obstetrics and Gynecology | Admitting: Obstetrics and Gynecology

## 2017-04-16 ENCOUNTER — Ambulatory Visit (INDEPENDENT_AMBULATORY_CARE_PROVIDER_SITE_OTHER): Payer: 59 | Admitting: Obstetrics and Gynecology

## 2017-04-16 ENCOUNTER — Encounter: Payer: Self-pay | Admitting: Obstetrics and Gynecology

## 2017-04-16 VITALS — BP 124/76 | HR 68 | Resp 16 | Ht 64.0 in | Wt 186.0 lb

## 2017-04-16 DIAGNOSIS — Z01419 Encounter for gynecological examination (general) (routine) without abnormal findings: Secondary | ICD-10-CM | POA: Diagnosis not present

## 2017-04-16 DIAGNOSIS — R3915 Urgency of urination: Secondary | ICD-10-CM | POA: Diagnosis not present

## 2017-04-16 LAB — POCT URINALYSIS DIPSTICK
BILIRUBIN UA: NEGATIVE
GLUCOSE UA: NEGATIVE
KETONES UA: NEGATIVE
Leukocytes, UA: NEGATIVE
NITRITE UA: NEGATIVE
PH UA: 5 (ref 5.0–8.0)
Urobilinogen, UA: 0.2 E.U./dL

## 2017-04-16 MED ORDER — NITROFURANTOIN MONOHYD MACRO 100 MG PO CAPS: 100.0000 mg | ORAL_CAPSULE | Freq: Two times a day (BID) | ORAL | 0 refills | Status: DC

## 2017-04-16 NOTE — Patient Instructions (Signed)

## 2017-04-16 NOTE — Progress Notes (Signed)
55 y.o. G17P2002 Married [place] American female here for annual exam.  Patient complains of having urgency, frequency, and possible blood in urine.  2 days ago noted blood in her urine and lower abdominal pain.  Symptoms are improved.  No fever.  Nausea today but thinks she ate something bad.  Back pain on right side and heating pad last night.   Lost 20 pounds with [employer].   Using Miralax daily and stool softener. Using probiotics as well.   Labs with PCP.   Urine today - 2+ blood, and trace protein.   PCP:   Dr. Colin Benton  Patient's last menstrual period was 03/30/2012.           Sexually active: Yes.    The current method of family planning is post menopausal status.    Exercising: No.  The patient does not participate in regular exercise at present. Smoker:  no  Health Maintenance: Pap: 12-12-13 Neg:Neg HR HPV, 06-29-12 Neg History of abnormal Pap:  Yes, Hx LGSIL in 2010. MMG: 04-29-16 Density B/Neg/BiRads1:TBC -- scheduled for 06/01/17 Colonoscopy: 08/2012 -- normal BMD:   n/a  Result  n/a TDaP:  2011 w/ PCP Gardasil:   no HIV: never Hep C: 10-21-16 Neg Screening Labs:  PCP   reports that she has never smoked. She has never used smokeless tobacco. She reports that she does not drink alcohol or use drugs.  Past Medical History:  Diagnosis Date  . Abnormal Pap smear of cervix   . Allergic rhinitis   . Asthma   . GERD (gastroesophageal reflux disease)   . Headache(784.0)    tension- not an issue  . Hypertension     Past Surgical History:  Procedure Laterality Date  . BALLOON DILATION N/A 11/19/2015   Procedure: BALLOON DILATION;  Surgeon: Garlan Fair, MD;  Location: [facility];  Service: Endoscopy;  Laterality: N/A;  . BUNIONECTOMY Bilateral   . COLONOSCOPY WITH PROPOFOL N/A 10/05/2012   Procedure: COLONOSCOPY WITH PROPOFOL;  Surgeon: Garlan Fair, MD;  Location: [facility];  Service: Endoscopy;  Laterality: N/A;  .  ESOPHAGOGASTRODUODENOSCOPY (EGD) WITH PROPOFOL N/A 11/19/2015   Procedure: ESOPHAGOGASTRODUODENOSCOPY (EGD) WITH PROPOFOL;  Surgeon: Garlan Fair, MD;  Location: [facility];  Service: Endoscopy;  Laterality: N/A;  . TUBAL LIGATION  27 years ago    Current Outpatient Prescriptions  Medication Sig Dispense Refill  . albuterol (PROVENTIL HFA) 108 (90 Base) MCG/ACT inhaler Inhale two puffs every 4-6 hours if needed for cough or wheeze 1 Inhaler 1  . Albuterol Sulfate (PROAIR RESPICLICK) 756 (90 Base) MCG/ACT AEPB Inhale 4 Doses into the lungs every 4 (four) hours as needed. For cough or wheeze 1 each 1  . Alpha-D-Galactosidase (BEANO PO) Take by mouth as needed.    Marland Kitchen azelastine (ASTELIN) 0.1 % nasal spray Place 2 sprays into both nostrils 2 (two) times daily. Use in each nostril as directed 30 mL 12  . cetirizine (ZYRTEC) 10 MG tablet Take 10 mg by mouth as needed for allergies.     . Cholecalciferol (D3-1000) 1000 units tablet Take 1,000 Units by mouth daily.    . Cyanocobalamin (B-12) 2500 MCG TABS Take 1 tablet by mouth daily.    . diphenhydrAMINE (BENADRYL) 25 MG tablet Take 25 mg by mouth at bedtime as needed for allergies or sleep.     Marland Kitchen docusate sodium (STOOL SOFTENER) 100 MG capsule Take 100 mg by mouth daily.    Marland Kitchen EPINEPHrine (AUVI-Q IJ) Inject as directed.    Marland Kitchen  fluticasone (FLONASE) 50 MCG/ACT nasal spray Place 2 sprays into both nostrils daily as needed for allergies. 16 g 5  . hydrochlorothiazide (HYDRODIURIL) 25 MG tablet TAKE 1 TABLET DAILY 90 tablet 0  . Lactobacillus-Inulin ([place] PO) Take by mouth daily.    . montelukast (SINGULAIR) 10 MG tablet Take 1 tablet (10 mg total) by mouth at bedtime. 90 tablet 1  . NON FORMULARY Forskolin Fat-Loss Diet tablet one tablet daily    . polyethylene glycol (MIRALAX / GLYCOLAX) packet Take 17 g by mouth daily.    . ranitidine (ZANTAC) 150 MG tablet Take 150 mg by mouth as needed for heartburn.     No current  facility-administered medications for this visit.     Family History  Problem Relation Age of Onset  . Emphysema Father   . Lung cancer Father   . High Cholesterol Mother   . Osteoporosis Mother   . Hypertension Mother   . Migraines Daughter   . Hypertension Son        exercise  . Asthma Son   . Hypertension Sister     ROS:  Pertinent items are noted in HPI.  Otherwise, a comprehensive ROS was negative.  Exam:   BP 124/76 (BP Location: Right Arm, Patient Position: Sitting, Cuff Size: Normal)   Pulse 68   Resp 16   Ht 5\' 4"  (1.626 m)   Wt 186 lb (84.4 kg)   LMP 03/30/2012   BMI 31.93 kg/m     General appearance: alert, cooperative and appears stated age Head: Normocephalic, without obvious abnormality, atraumatic Neck: no adenopathy, supple, symmetrical, trachea midline and thyroid normal to inspection and palpation Lungs: clear to auscultation bilaterally Breasts: normal appearance, no masses or tenderness, No nipple retraction or dimpling, No nipple discharge or bleeding, No axillary or supraclavicular adenopathy Heart: regular rate and rhythm Abdomen: umbilical hernia (asymptomatic). Abdomen is soft, non-tender; no masses, no organomegaly Extremities: extremities normal, atraumatic, no cyanosis or edema Skin: Skin color, texture, turgor normal. No rashes or lesions Lymph nodes: Cervical, supraclavicular, and axillary nodes normal. No abnormal inguinal nodes palpated Neurologic: Grossly normal  Pelvic: External genitalia:  no lesions              Urethra:  normal appearing urethra with no masses, tenderness or lesions              Bartholins and Skenes: normal                 Vagina: normal appearing vagina with normal color and discharge, no lesions              Cervix: no lesions              Pap taken: Yes.   Bimanual Exam:  Uterus:  normal size, contour, position, consistency, mobility, non-tender              Adnexa: no mass, fullness, tenderness               Rectal exam: Yes.  .  Confirms.              Anus:  normal sphincter tone, no lesions. Small hemorrhoid at 4:00.  Chaperone was present for exam.  Assessment:   Well woman visit with normal exam. Microscopic hematuria.  Back pain, dysuria and urgency. Constipation.  Weight gain.   Plan: Mammogram screening discussed. Recommended self breast awareness. Pap and HR HPV as above. Guidelines for Calcium, Vitamin D, regular exercise  program including cardiovascular and weight bearing exercise. Urine micro and culture.  Macrobid 100 mg po bid x 5 days.  We did discuss kidney stones as a potential cause of back pain and hematuria. We discussed weight loss through diet and exercise.  Follow up annually and prn.   After visit summary provided.

## 2017-04-17 LAB — URINALYSIS, MICROSCOPIC ONLY: CASTS: NONE SEEN /LPF

## 2017-04-18 LAB — URINE CULTURE

## 2017-04-20 ENCOUNTER — Other Ambulatory Visit: Payer: Self-pay | Admitting: *Deleted

## 2017-04-20 MED ORDER — CIPROFLOXACIN HCL 500 MG PO TABS: 500.0000 mg | ORAL_TABLET | Freq: Two times a day (BID) | ORAL | 0 refills | Status: DC

## 2017-04-21 ENCOUNTER — Ambulatory Visit: Payer: 59 | Admitting: Family Medicine

## 2017-04-21 LAB — CYTOLOGY - PAP
DIAGNOSIS: NEGATIVE
HPV (WINDOPATH): NOT DETECTED

## 2017-05-18 ENCOUNTER — Encounter: Payer: Self-pay | Admitting: Family Medicine

## 2017-05-18 ENCOUNTER — Ambulatory Visit (INDEPENDENT_AMBULATORY_CARE_PROVIDER_SITE_OTHER): Payer: 59 | Admitting: Family Medicine

## 2017-05-18 VITALS — BP 110/68 | HR 65 | Temp 98.4°F | Ht 64.0 in | Wt 190.5 lb

## 2017-05-18 DIAGNOSIS — M9903 Segmental and somatic dysfunction of lumbar region: Secondary | ICD-10-CM | POA: Diagnosis not present

## 2017-05-18 DIAGNOSIS — M545 Low back pain, unspecified: Secondary | ICD-10-CM

## 2017-05-18 DIAGNOSIS — M9902 Segmental and somatic dysfunction of thoracic region: Secondary | ICD-10-CM | POA: Diagnosis not present

## 2017-05-18 MED ORDER — CYCLOBENZAPRINE HCL 5 MG PO TABS: 5.0000 mg | ORAL_TABLET | Freq: Every evening | ORAL | 0 refills | Status: DC | PRN

## 2017-05-18 MED ORDER — NAPROXEN 500 MG PO TABS: 500.0000 mg | ORAL_TABLET | Freq: Two times a day (BID) | ORAL | 0 refills | Status: DC | PRN

## 2017-05-18 NOTE — Progress Notes (Signed)
HPI:  Victoria [name] is a pleasant 56 year old here for an acute visit for back pain: -Started 1 week ago after she was helping her sister whom is ill with ca get out of the bathtub -Pain is dull, constant, in the right low back, moderate to severe, sharp with certain movements Has tried Tylenol, Tessalon Perles, ibuprofen-and heat, helps a little -Denies fevers, malaise, radiation, bowel or bladder dysfunction or prior back injury  ROS: See pertinent positives and negatives per HPI.  Past Medical History:  Diagnosis Date  . Abnormal Pap smear of cervix   . Allergic rhinitis   . Asthma   . GERD (gastroesophageal reflux disease)   . Headache(784.0)    tension- not an issue  . Hypertension     Past Surgical History:  Procedure Laterality Date  . BALLOON DILATION N/A 11/19/2015   Performed by Garlan Fair, MD at [facility]  . BUNIONECTOMY Bilateral   . COLONOSCOPY WITH PROPOFOL N/A 10/05/2012   Performed by Garlan Fair, MD at [facility]  . ESOPHAGOGASTRODUODENOSCOPY (EGD) WITH PROPOFOL N/A 11/19/2015   Performed by Garlan Fair, MD at [place]  . TUBAL LIGATION  27 years ago    Family History  Problem Relation Age of Onset  . Emphysema Father   . Lung cancer Father   . High Cholesterol Mother   . Osteoporosis Mother   . Hypertension Mother   . Migraines Daughter   . Hypertension Son        exercise  . Asthma Son   . Hypertension Sister     Social History   Socioeconomic History  . Marital status: Married    Spouse name: None  . Number of children: 2  . Years of education: None  . Highest education level: None  Social Needs  . Financial resource strain: None  . Food insecurity - worry: None  . Food insecurity - inability: None  . Transportation needs - medical: None  . Transportation needs - non-medical: None  Occupational History  . Occupation: Biochemist, clinical,    Employer: [employer]  Tobacco Use  . Smoking status: Never Smoker  .  Smokeless tobacco: Never Used  Substance and Sexual Activity  . Alcohol use: No  . Drug use: No  . Sexual activity: Yes    Partners: Male    Birth control/protection: Surgical, Post-menopausal    Comment: Tubal Ligation  Other Topics Concern  . None  Social History Narrative   Work or School: [employer] - full time, like job      Home Situation: lives with husband      Spiritual Beliefs: Christian - baptist      Lifestyle: no regular exercise, diet "bad"     Current Outpatient Medications:  .  albuterol (PROVENTIL HFA) 108 (90 Base) MCG/ACT inhaler, Inhale two puffs every 4-6 hours if needed for cough or wheeze, Disp: 1 Inhaler, Rfl: 1 .  Albuterol Sulfate (PROAIR RESPICLICK) 326 (90 Base) MCG/ACT AEPB, Inhale 4 Doses into the lungs every 4 (four) hours as needed. For cough or wheeze, Disp: 1 each, Rfl: 1 .  Alpha-D-Galactosidase (BEANO PO), Take by mouth as needed., Disp: , Rfl:  .  azelastine (ASTELIN) 0.1 % nasal spray, Place 2 sprays into both nostrils 2 (two) times daily. Use in each nostril as directed, Disp: 30 mL, Rfl: 12 .  cetirizine (ZYRTEC) 10 MG tablet, Take 10 mg by mouth as needed for allergies. , Disp: , Rfl:  .  Cholecalciferol (  D3-1000) 1000 units tablet, Take 1,000 Units by mouth daily., Disp: , Rfl:  .  Cyanocobalamin (B-12) 2500 MCG TABS, Take 1 tablet by mouth daily., Disp: , Rfl:  .  diphenhydrAMINE (BENADRYL) 25 MG tablet, Take 25 mg by mouth at bedtime as needed for allergies or sleep. , Disp: , Rfl:  .  docusate sodium (STOOL SOFTENER) 100 MG capsule, Take 100 mg by mouth daily., Disp: , Rfl:  .  EPINEPHrine (AUVI-Q IJ), Inject as directed., Disp: , Rfl:  .  fluticasone (FLONASE) 50 MCG/ACT nasal spray, Place 2 sprays into both nostrils daily as needed for allergies., Disp: 16 g, Rfl: 5 .  hydrochlorothiazide (HYDRODIURIL) 25 MG tablet, TAKE 1 TABLET DAILY, Disp: 90 tablet, Rfl: 0 .  Lactobacillus-Inulin ([place]), Take by  mouth daily., Disp: , Rfl:  .  montelukast (SINGULAIR) 10 MG tablet, Take 1 tablet (10 mg total) by mouth at bedtime., Disp: 90 tablet, Rfl: 1 .  NON FORMULARY, Forskolin Fat-Loss Diet tablet one tablet daily, Disp: , Rfl:  .  polyethylene glycol (MIRALAX / GLYCOLAX) packet, Take 17 g by mouth daily., Disp: , Rfl:  .  ranitidine (ZANTAC) 150 MG tablet, Take 150 mg by mouth as needed for heartburn., Disp: , Rfl:  .  cyclobenzaprine (FLEXERIL) 5 MG tablet, Take 1 tablet (5 mg total) at bedtime as needed by mouth for muscle spasms., Disp: 15 tablet, Rfl: 0 .  naproxen (NAPROSYN) 500 MG tablet, Take 1 tablet (500 mg total) every 12 (twelve) hours as needed by mouth., Disp: 14 tablet, Rfl: 0  EXAM:  Vitals:   05/18/17 1313  BP: 110/68  Pulse: 65  Temp: 98.4 F (36.9 C)    Body mass index is 32.7 kg/m.  GENERAL: vitals reviewed and listed above, alert, oriented, appears well hydrated and in no acute distress  HEENT: atraumatic, conjunttiva clear, no obvious abnormalities on inspection of external nose and ears  NECK: no obvious masses on inspection  LUNGS: clear to auscultation bilaterally, no wheezes, rales or rhonchi, good air movement  CV: HRRR, no peripheral edema  MS: moves all extremities without noticeable abnormality Normal Gait Normal inspection of back, no obvious scoliosis or leg length descrepancy No bony TTP Soft tissue TTP at: Quadratus lumborum with a posterior L4 tender point on the right, L3-5 side bend left rotated right, T5-9 side bent right rotated left -/+ tests: neg trendelenburg,-facet loading, -SLRT, -CLRT, -FABER, -FADIR Normal muscle strength, sensation to light touch and DTRs in LEs bilaterally   PSYCH: pleasant and cooperative, no obvious depression or anxiety  ASSESSMENT AND PLAN:  Discussed the following assessment and plan:  Acute right-sided low back pain without sciatica  Somatic dysfunction of spine, lumbar  Somatic dysfunction of thoracic  region  -Discussed potential etiologies -Suspect muscle strain -She wanted to try osteopathic treatments today will determine strength tender points, tolerated well, see below -NSAIDs as needed, heat, muscle relaxer at night as needed, home exercises, topical menthol -Follow-up 2-4 weeks -Patient advised to return or notify a doctor immediately if symptoms worsen or persist or new concerns  PROCEDURE NOTE : OSTEOPATHIC TREATMENT The decision today to treat with gentle Osteopathic Manipulative Therapy  (OMT) was based on physical exam findings, diagnoses and patient wishes. Verbal consent was obtained after after explanation of risks and benefits. No Cervical HVLA manipulation was performed. After consent was obtained, treatment was  performed as below:      Regions treated:  Lumbar, thoracic  Techniques used: counterstrain, MR The patient tolerated the treatment well and reported Improved  symptoms following treatment today. Follow up treatment was advised in: 1-2 weeks   Patient Instructions  BEFORE YOU LEAVE: -Low back exercises -follow up: 2-4 weeks  Continue the exercises provided at least 4-5 days/week and keep moving.  Heat for 15 minutes twice daily.  Tiger balm as needed for pain.  Naproxen as needed for pain. Muscle relaxer at night as needed.  I hope you are feeling better soon! Follow up sooner if your symptoms worsen, new concerns arise or you are not improving with treatment.     Colin Benton R., DO

## 2017-05-18 NOTE — Patient Instructions (Addendum)
BEFORE YOU LEAVE: -Low back exercises -follow up: 2-4 weeks  Continue the exercises provided at least 4-5 days/week and keep moving.  Heat for 15 minutes twice daily.  Tiger balm as needed for pain.  Naproxen as needed for pain. Muscle relaxer at night as needed.  I hope you are feeling better soon! Follow up sooner if your symptoms worsen, new concerns arise or you are not improving with treatment.

## 2017-05-26 ENCOUNTER — Ambulatory Visit: Payer: 59 | Admitting: Family Medicine

## 2017-06-01 ENCOUNTER — Ambulatory Visit
Admission: RE | Admit: 2017-06-01 | Discharge: 2017-06-01 | Disposition: A | Discharge status: Home / Self Care | Payer: 59 | Source: Ambulatory Visit | Attending: Family Medicine | Admitting: Family Medicine

## 2017-06-01 DIAGNOSIS — Z1231 Encounter for screening mammogram for malignant neoplasm of breast: Secondary | ICD-10-CM

## 2017-06-11 ENCOUNTER — Ambulatory Visit (INDEPENDENT_AMBULATORY_CARE_PROVIDER_SITE_OTHER): Payer: 59 | Admitting: Family Medicine

## 2017-06-11 ENCOUNTER — Encounter: Payer: Self-pay | Admitting: Family Medicine

## 2017-06-11 VITALS — BP 128/90 | HR 73 | Temp 98.4°F | Ht 64.0 in

## 2017-06-11 DIAGNOSIS — M5441 Lumbago with sciatica, right side: Secondary | ICD-10-CM | POA: Diagnosis not present

## 2017-06-11 MED ORDER — PREDNISONE 10 MG PO TABS: ORAL_TABLET | ORAL | 0 refills | Status: DC

## 2017-06-11 NOTE — Progress Notes (Signed)
HPI:   Follow-up back pain: -Started after helping to lift her sister out of the bathtub, started about a month ago -Was in right low back -Treated with conservative therapy for possible muscle strain vs other -Today reports: Tried the home exercises, anti-inflammatory, muscle relaxer, heat and she started seeing a chiropractor for chiropractic treatments, but none of this is helped -reports has fairly persistent right low back, moderate to severe at times, burning pain that does at times radiate to the right buttock and right posterior upper leg -Denies: Fevers, malaise, weight loss, weakness, numbness, bowel or bladder dysfunction -thinks that chiropractic is actually worsened the condition -reports has taken prednisone without any problems in the past   ROS: See pertinent positives and negatives per HPI.  Past Medical History:  Diagnosis Date  . Abnormal Pap smear of cervix   . Allergic rhinitis   . Asthma   . GERD (gastroesophageal reflux disease)   . Headache(784.0)    tension- not an issue  . Hypertension     Past Surgical History:  Procedure Laterality Date  . BALLOON DILATION N/A 11/19/2015   Procedure: BALLOON DILATION;  Surgeon: Garlan Fair, MD;  Location: [facility];  Service: Endoscopy;  Laterality: N/A;  . BUNIONECTOMY Bilateral   . COLONOSCOPY WITH PROPOFOL N/A 10/05/2012   Procedure: COLONOSCOPY WITH PROPOFOL;  Surgeon: Garlan Fair, MD;  Location: [facility];  Service: Endoscopy;  Laterality: N/A;  . ESOPHAGOGASTRODUODENOSCOPY (EGD) WITH PROPOFOL N/A 11/19/2015   Procedure: ESOPHAGOGASTRODUODENOSCOPY (EGD) WITH PROPOFOL;  Surgeon: Garlan Fair, MD;  Location: [facility];  Service: Endoscopy;  Laterality: N/A;  . TUBAL LIGATION  27 years ago    Family History  Problem Relation Age of Onset  . Emphysema Father   . Lung cancer Father   . High Cholesterol Mother   . Osteoporosis Mother   . Hypertension Mother   . Migraines Daughter   .  Hypertension Son        exercise  . Asthma Son   . Hypertension Sister   . Breast cancer Neg Hx     Social History   Socioeconomic History  . Marital status: Married    Spouse name: None  . Number of children: 2  . Years of education: None  . Highest education level: None  Social Needs  . Financial resource strain: None  . Food insecurity - worry: None  . Food insecurity - inability: None  . Transportation needs - medical: None  . Transportation needs - non-medical: None  Occupational History  . Occupation: Biochemist, clinical,    Employer: [employer]  Tobacco Use  . Smoking status: Never Smoker  . Smokeless tobacco: Never Used  Substance and Sexual Activity  . Alcohol use: No  . Drug use: No  . Sexual activity: Yes    Partners: Male    Birth control/protection: Surgical, Post-menopausal    Comment: Tubal Ligation  Other Topics Concern  . None  Social History Narrative   Work or School: [employer] - full time, like job      Home Situation: lives with husband      Spiritual Beliefs: Christian - baptist      Lifestyle: no regular exercise, diet "bad"     Current Outpatient Medications:  .  albuterol (PROVENTIL HFA) 108 (90 Base) MCG/ACT inhaler, Inhale two puffs every 4-6 hours if needed for cough or wheeze, Disp: 1 Inhaler, Rfl: 1 .  Albuterol Sulfate (PROAIR RESPICLICK) 993 (90 Base) MCG/ACT AEPB, Inhale  4 Doses into the lungs every 4 (four) hours as needed. For cough or wheeze, Disp: 1 each, Rfl: 1 .  Alpha-D-Galactosidase (BEANO PO), Take by mouth as needed., Disp: , Rfl:  .  azelastine (ASTELIN) 0.1 % nasal spray, Place 2 sprays into both nostrils 2 (two) times daily. Use in each nostril as directed, Disp: 30 mL, Rfl: 12 .  cetirizine (ZYRTEC) 10 MG tablet, Take 10 mg by mouth as needed for allergies. , Disp: , Rfl:  .  Cholecalciferol (D3-1000) 1000 units tablet, Take 1,000 Units by mouth daily., Disp: , Rfl:  .  Cyanocobalamin (B-12) 2500 MCG TABS,  Take 1 tablet by mouth daily., Disp: , Rfl:  .  diphenhydrAMINE (BENADRYL) 25 MG tablet, Take 25 mg by mouth at bedtime as needed for allergies or sleep. , Disp: , Rfl:  .  docusate sodium (STOOL SOFTENER) 100 MG capsule, Take 100 mg by mouth daily., Disp: , Rfl:  .  EPINEPHrine (AUVI-Q IJ), Inject as directed., Disp: , Rfl:  .  fluticasone (FLONASE) 50 MCG/ACT nasal spray, Place 2 sprays into both nostrils daily as needed for allergies., Disp: 16 g, Rfl: 5 .  hydrochlorothiazide (HYDRODIURIL) 25 MG tablet, TAKE 1 TABLET DAILY, Disp: 90 tablet, Rfl: 0 .  Lactobacillus-Inulin ([place]), Take by mouth daily., Disp: , Rfl:  .  montelukast (SINGULAIR) 10 MG tablet, Take 1 tablet (10 mg total) by mouth at bedtime., Disp: 90 tablet, Rfl: 1 .  NON FORMULARY, Forskolin Fat-Loss Diet tablet one tablet daily, Disp: , Rfl:  .  polyethylene glycol (MIRALAX / GLYCOLAX) packet, Take 17 g by mouth daily., Disp: , Rfl:  .  ranitidine (ZANTAC) 150 MG tablet, Take 150 mg by mouth as needed for heartburn., Disp: , Rfl:  .  predniSONE (DELTASONE) 10 MG tablet, 50mg  (5 tabs) x 2 days, then 30mg  (3 tabs) x 2 days, then 20mg  (2 tabs) x 3 days, then 10mg  (1 tab) x 3 days, Disp: 25 tablet, Rfl: 0  EXAM:  Vitals:   06/11/17 1621  BP: 128/90  Pulse: 73  Temp: 98.4 F (36.9 C)    Body mass index is 32.7 kg/m.  GENERAL: vitals reviewed and listed above, alert, oriented, appears well hydrated and in no acute distress  HEENT: atraumatic, conjunttiva clear, no obvious abnormalities on inspection of external nose and ears  NECK: no obvious masses on inspection  LUNGS: clear to auscultation bilaterally, no wheezes, rales or rhonchi, good air movement  CV: HRRR, no peripheral edema  MS: moves all extremities without noticeable abnormality, antalgic gait Normal Gait Normal inspection of back, no obvious scoliosis or leg length descrepancy No bony TTP Soft tissue TTP at: R lower lumbar  paraspinal region -/+ tests: neg trendelenburg,-facet loading, -SLRT, -CLRT, -FABER, -FADIR Normal muscle strength, sensation to light touch and DTRs in LEs bilaterally  PSYCH: pleasant and cooperative, no obvious depression or anxiety  ASSESSMENT AND PLAN:  Discussed the following assessment and plan:  Acute right-sided low back pain with right-sided sciatica - Plan: DG Lumbar Spine Complete  -we discussed possible serious and likely etiologies, workup and treatment, treatment risks and return precautions -given symptoms a day and that she is not improving lumbar radiculopathy is suspected, is getting ready to go to a trip from [place] and really wants relief, discussed options, also advised imaging -after this discussion, Hiedi opted for plain films, possible MRI versus referral if not improving over the next several days or worsening or significant findings on  imaging, prednisone after discussion of risks and other options -of course, we advised Khala  to return or notify a doctor immediately if symptoms worsen or persist or new concerns arise.  Patient Instructions  BEFORE YOU LEAVE: -xray sheet  Go get the xray.  Take the prednisone as prescribed. DO NOT take any antiinflammatory meds (aleve, naproxen, ibuprofen, etc while taking prednisone)  Continue to get gentle activity.  Seek ortho urgent care or emergency care if worsening or severe symptoms.    Colin Benton R., DO

## 2017-06-11 NOTE — Patient Instructions (Signed)
BEFORE YOU LEAVE: -xray sheet  Go get the xray.  Take the prednisone as prescribed. DO NOT take any antiinflammatory meds (aleve, naproxen, ibuprofen, etc while taking prednisone)  Continue to get gentle activity.  Seek ortho urgent care or emergency care if worsening or severe symptoms.

## 2017-06-12 ENCOUNTER — Ambulatory Visit (INDEPENDENT_AMBULATORY_CARE_PROVIDER_SITE_OTHER)
Admission: RE | Admit: 2017-06-12 | Discharge: 2017-06-12 | Disposition: A | Discharge status: Home / Self Care | Payer: 59 | Source: Ambulatory Visit | Attending: Family Medicine | Admitting: Family Medicine

## 2017-06-12 DIAGNOSIS — M5441 Lumbago with sciatica, right side: Secondary | ICD-10-CM | POA: Diagnosis not present

## 2017-06-16 ENCOUNTER — Other Ambulatory Visit: Payer: Self-pay | Admitting: *Deleted

## 2017-06-16 DIAGNOSIS — M545 Low back pain: Secondary | ICD-10-CM

## 2017-06-30 DIAGNOSIS — M1990 Unspecified osteoarthritis, unspecified site: Secondary | ICD-10-CM

## 2017-06-30 HISTORY — DX: Unspecified osteoarthritis, unspecified site: M19.90

## 2017-07-01 ENCOUNTER — Ambulatory Visit (INDEPENDENT_AMBULATORY_CARE_PROVIDER_SITE_OTHER): Payer: 59 | Admitting: Orthopedic Surgery

## 2017-07-03 ENCOUNTER — Encounter (INDEPENDENT_AMBULATORY_CARE_PROVIDER_SITE_OTHER): Payer: Self-pay | Admitting: Family

## 2017-07-03 ENCOUNTER — Ambulatory Visit (INDEPENDENT_AMBULATORY_CARE_PROVIDER_SITE_OTHER): Payer: 59 | Admitting: Family

## 2017-07-03 DIAGNOSIS — M545 Low back pain: Secondary | ICD-10-CM | POA: Diagnosis not present

## 2017-07-03 NOTE — Progress Notes (Signed)
Office Visit Note   Patient: Victoria [name]           Date of Birth: 10-24-1960           MRN: [id number] Visit Date: 07/03/2017              Requested by: Lucretia Kern, DO [address] [place], [place] [address] PCP: Lucretia Kern, DO  No chief complaint on file.     HPI: The patient is a 57 year old woman seen today for evaluation of right sided low back pain, ongoing since early November after helping her ill sister get out of a bath tub. Has been having low back pain, mainly on right, burning pain shooting in to buttock and posterior thigh. Tingling to posterior thigh. No weakness. No red flag symptoms. Pain and discomfort in bed, difficulty with sleep.  Has been to chiropractor several times. Has tried muscle relaxers and prednisone taper as well without relief.  Assessment & Plan: Visit Diagnoses:  1. Acute right-sided low back pain, with sciatica presence unspecified     Plan: Will refer to [facility] for ESI, next available 07/15/17. Will go ahead and order mri lumbar spine as well. Plan to call her with mri results. If possible could get her in with [facility] for injection if they have earlier availability.   Follow-Up Instructions: No Follow-up on file.   Back Exam   Tenderness  The patient is experiencing tenderness in the lumbar.  Range of Motion  The patient has normal back ROM.  Muscle Strength  The patient has normal back strength.  Tests  Straight leg raise right: positive Straight leg raise left: negative  Other  Toe walk: normal Gait: normal   Comments:  Does have paraspinal tenderness on right as well      Patient is alert, oriented, no adenopathy, well-dressed, normal affect, normal respiratory effort.   Imaging: No results found. No images are attached to the encounter.  Labs: Lab Results  Component Value Date   HGBA1C 6.4 10/21/2016   HGBA1C 6.3 05/15/2016   REPTSTATUS 08/29/2016 FINAL 08/28/2016   CULT MULTIPLE  SPECIES PRESENT, SUGGEST RECOLLECTION (A) 08/28/2016   LABORGA  04/09/2016    Multiple organisms present,each less than 10,000 CFU/mL. These organisms,commonly found on external and internal genitalia,are considered colonizers. No further testing performed.     @LABSALLVALUES (HGBA1)@  There is no height or weight on file to calculate BMI.  Orders:  No orders of the defined types were placed in this encounter.  No orders of the defined types were placed in this encounter.    Procedures: No procedures performed  Clinical Data: No additional findings.  ROS:  All other systems negative, except as noted in the HPI. Review of Systems  Constitutional: Negative for chills and fever.  Musculoskeletal: Positive for back pain and myalgias.  Neurological: Positive for numbness. Negative for weakness.    Objective: Vital Signs: LMP 03/30/2012   Specialty Comments:  No specialty comments available.  PMFS History: Patient Active Problem List   Diagnosis Date Noted  . Class 1 obesity without serious comorbidity with body mass index (BMI) of 31.0 to 31.9 in adult 10/21/2016  . Chronic constipation 10/21/2016  . Seasonal and perennial allergic rhinitis 10/05/2007  . Asthma, mild intermittent, well-controlled 10/05/2007   Past Medical History:  Diagnosis Date  . Abnormal Pap smear of cervix   . Allergic rhinitis   . Asthma   . GERD (gastroesophageal reflux disease)   .  Headache(784.0)    tension- not an issue  . Hypertension     Family History  Problem Relation Age of Onset  . Emphysema Father   . Lung cancer Father   . High Cholesterol Mother   . Osteoporosis Mother   . Hypertension Mother   . Migraines Daughter   . Hypertension Son        exercise  . Asthma Son   . Hypertension Sister   . Breast cancer Neg Hx     Past Surgical History:  Procedure Laterality Date  . BALLOON DILATION N/A 11/19/2015   Procedure: BALLOON DILATION;  Surgeon: Garlan Fair, MD;   Location: [facility];  Service: Endoscopy;  Laterality: N/A;  . BUNIONECTOMY Bilateral   . COLONOSCOPY WITH PROPOFOL N/A 10/05/2012   Procedure: COLONOSCOPY WITH PROPOFOL;  Surgeon: Garlan Fair, MD;  Location: [facility];  Service: Endoscopy;  Laterality: N/A;  . ESOPHAGOGASTRODUODENOSCOPY (EGD) WITH PROPOFOL N/A 11/19/2015   Procedure: ESOPHAGOGASTRODUODENOSCOPY (EGD) WITH PROPOFOL;  Surgeon: Garlan Fair, MD;  Location: [facility];  Service: Endoscopy;  Laterality: N/A;  . TUBAL LIGATION  27 years ago   Social History   Occupational History  . Occupation: Biochemist, clinical,    Employer: [employer]  Tobacco Use  . Smoking status: Never Smoker  . Smokeless tobacco: Never Used  Substance and Sexual Activity  . Alcohol use: No  . Drug use: No  . Sexual activity: Yes    Partners: Male    Birth control/protection: Surgical, Post-menopausal    Comment: Tubal Ligation

## 2017-07-04 ENCOUNTER — Other Ambulatory Visit: Payer: Self-pay | Admitting: Allergy & Immunology

## 2017-07-05 ENCOUNTER — Other Ambulatory Visit: Payer: Self-pay | Admitting: Family Medicine

## 2017-07-06 NOTE — Telephone Encounter (Signed)
Courtesy refill  

## 2017-07-07 ENCOUNTER — Telehealth (INDEPENDENT_AMBULATORY_CARE_PROVIDER_SITE_OTHER): Payer: Self-pay | Admitting: Family

## 2017-07-07 NOTE — Telephone Encounter (Signed)
Patient had an appt this past Friday and states that an MRI was supposed to be ordered for her, I did not see a referral. Can you see if this was done? She would like a CB once its in place # [phone]

## 2017-07-07 NOTE — Telephone Encounter (Signed)
I called spoke with patient advised that referral has already been placed advised authorization just approved yesterday. Advised her to call [facility] to schedule an appointment that works best for her.

## 2017-07-09 ENCOUNTER — Encounter: Payer: Self-pay | Admitting: Family Medicine

## 2017-07-11 ENCOUNTER — Ambulatory Visit
Admission: RE | Admit: 2017-07-11 | Discharge: 2017-07-11 | Disposition: A | Discharge status: Home / Self Care | Payer: 59 | Source: Ambulatory Visit | Attending: Family | Admitting: Family

## 2017-07-11 DIAGNOSIS — M545 Low back pain: Secondary | ICD-10-CM

## 2017-07-14 ENCOUNTER — Other Ambulatory Visit (INDEPENDENT_AMBULATORY_CARE_PROVIDER_SITE_OTHER): Payer: Self-pay | Admitting: Physical Medicine and Rehabilitation

## 2017-07-14 ENCOUNTER — Telehealth (INDEPENDENT_AMBULATORY_CARE_PROVIDER_SITE_OTHER): Payer: Self-pay | Admitting: Physical Medicine and Rehabilitation

## 2017-07-14 MED ORDER — DIAZEPAM 5 MG PO TABS: ORAL_TABLET | ORAL | 0 refills | Status: DC

## 2017-07-14 NOTE — Telephone Encounter (Signed)
Faxed to patient's pharmacy.  

## 2017-07-14 NOTE — Telephone Encounter (Signed)
Patient has an injection with Dr. Ernestina Patches on 01/17. She would like a call to answer her questions and talk her through the process of whats going to happen at her appt. Please advise # [phone]

## 2017-07-14 NOTE — Telephone Encounter (Signed)
printed

## 2017-07-14 NOTE — Telephone Encounter (Signed)
Patient would like Valium prior to her injection on Thursday. RA [place].

## 2017-07-16 ENCOUNTER — Encounter (INDEPENDENT_AMBULATORY_CARE_PROVIDER_SITE_OTHER): Payer: Self-pay | Admitting: Physical Medicine and Rehabilitation

## 2017-07-16 ENCOUNTER — Ambulatory Visit (INDEPENDENT_AMBULATORY_CARE_PROVIDER_SITE_OTHER): Payer: 59 | Admitting: Physical Medicine and Rehabilitation

## 2017-07-16 ENCOUNTER — Ambulatory Visit (INDEPENDENT_AMBULATORY_CARE_PROVIDER_SITE_OTHER): Payer: 59

## 2017-07-16 VITALS — BP 140/91 | HR 73 | Temp 98.5°F

## 2017-07-16 DIAGNOSIS — M5116 Intervertebral disc disorders with radiculopathy, lumbar region: Secondary | ICD-10-CM

## 2017-07-16 DIAGNOSIS — M47816 Spondylosis without myelopathy or radiculopathy, lumbar region: Secondary | ICD-10-CM

## 2017-07-16 DIAGNOSIS — M5416 Radiculopathy, lumbar region: Secondary | ICD-10-CM

## 2017-07-16 MED ORDER — BETAMETHASONE SOD PHOS & ACET 6 (3-3) MG/ML IJ SUSP: 12.0000 mg | Freq: Once | INTRAMUSCULAR | Status: DC

## 2017-07-16 MED ORDER — METHYLPREDNISOLONE ACETATE 80 MG/ML IJ SUSP
80.0000 mg | Freq: Once | INTRAMUSCULAR | Status: AC
  Administered 2017-07-16: 80 mg

## 2017-07-16 NOTE — Progress Notes (Deleted)
Pt states a burning, pulling type of pain in lower back on the right side. Pt states pain has been going on for the past 2 months. Pt states getting up out of the chair and sitting down in a chair makes the pain worse, ice pack eases pain. +Driver, -BT, -Dye Allergies.

## 2017-07-16 NOTE — Patient Instructions (Signed)

## 2017-07-24 ENCOUNTER — Other Ambulatory Visit (INDEPENDENT_AMBULATORY_CARE_PROVIDER_SITE_OTHER): Payer: Self-pay | Admitting: Family

## 2017-07-24 ENCOUNTER — Telehealth (INDEPENDENT_AMBULATORY_CARE_PROVIDER_SITE_OTHER): Payer: Self-pay | Admitting: Family

## 2017-07-24 DIAGNOSIS — M545 Low back pain: Secondary | ICD-10-CM

## 2017-07-24 NOTE — Telephone Encounter (Signed)
Patient left a voicemail asking about a referral for physical therapy. I didn't see one for her, please advise # [phone]

## 2017-07-27 ENCOUNTER — Telehealth (INDEPENDENT_AMBULATORY_CARE_PROVIDER_SITE_OTHER): Payer: Self-pay | Admitting: Family

## 2017-07-27 NOTE — Telephone Encounter (Signed)
Patient called asked of she can be referred for (PT). Patient advised nothing has worked so far.  The number to contact patient is [phone]

## 2017-07-28 ENCOUNTER — Telehealth: Payer: Self-pay | Admitting: Family Medicine

## 2017-07-28 NOTE — Telephone Encounter (Signed)
I am sorry to hear that she is not feeling well.  It looks like her back doctor ordered the MRI and that she does have disc issues and they are trying some various treatments for this.  We think this is the best route trying injections, ablation and physical therapy -nonsurgical approaches first, unless her specialist feels otherwise.  She also could try some osteopathic treatments if she wishes.  She should check with her insurance to see if this is covered.  This would not be in place of the treatment she is receiving with her specialist, but she could try this altogether with what they are offering her.  These are very gentle treatments of the myofascia, they sometimes can really help with pain.

## 2017-07-28 NOTE — Telephone Encounter (Signed)
I called advised she was referred to [facility] off [address], gave her their office contact number so she can call and schedule.

## 2017-07-28 NOTE — Telephone Encounter (Signed)
Copied from [place] [id number]. Topic: General - Other >> Jul 28, 2017  8:17 AM Synthia Innocent wrote: Reason for CRM: Would like Dr Maudie Mercury to review MRI L Spine results. Patient wants her input, in a lot of pain.

## 2017-07-28 NOTE — Telephone Encounter (Signed)
I called the pt and informed her of the message below.  She stated she will check with her insurance company for coverage for OMT and call back if needed.

## 2017-07-28 NOTE — Procedures (Signed)
Victoria [name] is a 57 year old female who comes in today at the request of Dondra Prader, FN-P for interventional spine procedure diagnostically and hopefully therapeutically.  She has been followed by Junie Panning with history of acute on chronic right-sided low back pain for about 2 months now.  It is not been alleviated with activity modification or medications.  She does report that ice seems to help the most.  She reports most of her symptoms going from sit to stand and with standing and extension.  MRI findings do show asymmetric right-sided facet arthropathy at L4-5 and L5-S1.  She has no focal nerve compression.  No paresthesias.  She has good distal strength.  We are going to complete a diagnostic and hopefully therapeutic right L4-5 and L5-S1 facet joint block.  We could look at a double block paradigm and radiofrequency ablation depending on her relief.  Lumbar Facet Joint Intra-Articular Injection(s) with Fluoroscopic Guidance  Patient: Victoria [name]      Date of Birth: 22-Apr-1961 MRN: [id number] PCP: Lucretia Kern, DO      Visit Date: 07/16/2017   Universal Protocol:    Date/Time: 07/16/2017  Consent Given By: the patient  Position: PRONE   Additional Comments: Vital signs were monitored before and after the procedure. Patient was prepped and draped in the usual sterile fashion. The correct patient, procedure, and site was verified.   Injection Procedure Details:  Procedure Site One Meds Administered:  Meds ordered this encounter  Medications  . DISCONTD: betamethasone acetate-betamethasone sodium phosphate (CELESTONE) injection 12 mg  . methylPREDNISolone acetate (DEPO-MEDROL) injection 80 mg     Laterality: Right  Location/Site:  L4-L5 L5-S1  Needle size: 22 guage  Needle type: Spinal  Needle Placement: Articular  Findings:  -Comments: Excellent flow of contrast producing a partial arthrogram.  Procedure Details: The fluoroscope beam is vertically oriented  in AP, and the inferior recess is visualized beneath the lower pole of the inferior apophyseal process, which represents the target point for needle insertion. When direct visualization is difficult the target point is located at the medial projection of the vertebral pedicle. The region overlying each aforementioned target is locally anesthetized with a 1 to 2 ml. volume of 1% Lidocaine without Epinephrine.   The spinal needle was inserted into each of the above mentioned facet joints using biplanar fluoroscopic guidance. A 0.25 to 0.5 ml. volume of Isovue-250 was injected and a partial facet joint arthrogram was obtained. A single spot film was obtained of the resulting arthrogram.    One to 1.25 ml of the steroid/anesthetic solution was then injected into each of the facet joints noted above.   Additional Comments:  The patient tolerated the procedure well Dressing: Band-Aid    Post-procedure details: Patient was observed during the procedure. Post-procedure instructions were reviewed.  Patient left the clinic in stable condition.  Pertinent Imaging: MRI LUMBAR SPINE WITHOUT CONTRAST  TECHNIQUE: Multiplanar, multisequence MR imaging of the lumbar spine was performed. No intravenous contrast was administered.  COMPARISON:  Lumbar spine radiographs 06/12/2017.  FINDINGS: Segmentation: 5 non rib-bearing lumbar type vertebral bodies are present.  Alignment:  AP alignment is anatomic.  Vertebrae:  Marrow signal and vertebral body heights are normal.  Conus medullaris and cauda equina: Conus extends to the L1-2 Level. Conus and cauda equina appear normal.  Paraspinal and other soft tissues: Limited imaging the abdomen is unremarkable. There is no significant adenopathy.  Disc levels:  L1-2: Negative.  L2-3: Negative.  L3-4: Negative.  L4-5: A  broad-based disc protrusion is present. Moderate facet hypertrophy is noted. Central canal is patent. Mild  foraminal narrowing is present bilaterally.  L5-S1: Asymmetric right-sided facet hypertrophy and spurring is present. There is a mild broad-based disc protrusion. This results an mild right foraminal narrowing. The central canal and left foramen are patent.  IMPRESSION: 1. Asymmetric moderate right-sided facet hypertrophy and a broad-based disc protrusion at L5-S1 leads to mild right foraminal narrowing, potentially impacting the right L5 nerve root. 2. Moderate facet hypertrophy and a broad-based disc protrusion at L4-5 leads to mild foraminal narrowing bilaterally.   Electronically Signed   By: San Morelle M.D.   On: 07/11/2017 11:51

## 2017-08-07 ENCOUNTER — Ambulatory Visit: Payer: 59 | Attending: Family | Admitting: Physical Therapy

## 2017-08-07 ENCOUNTER — Other Ambulatory Visit: Payer: Self-pay

## 2017-08-07 ENCOUNTER — Encounter: Payer: Self-pay | Admitting: Physical Therapy

## 2017-08-07 DIAGNOSIS — G8929 Other chronic pain: Secondary | ICD-10-CM | POA: Insufficient documentation

## 2017-08-07 DIAGNOSIS — M545 Low back pain: Secondary | ICD-10-CM | POA: Insufficient documentation

## 2017-08-07 DIAGNOSIS — M6281 Muscle weakness (generalized): Secondary | ICD-10-CM

## 2017-08-07 DIAGNOSIS — R293 Abnormal posture: Secondary | ICD-10-CM | POA: Diagnosis present

## 2017-08-07 NOTE — Therapy (Signed)
[facility] [place], [place], [address] Phone: [phone]   Fax:  [phone]  Physical Therapy Evaluation  Patient Details  Name: Victoria [name] MRN: [id number] Date of Birth: [birth year] Referring Provider: Dondra Prader NP   Encounter Date: 08/07/2017  PT End of Session - 08/07/17 0849    Visit Number  1    Number of Visits  13    Date for PT Re-Evaluation  09/18/17    PT Start Time  0800    PT Stop Time  0846    PT Time Calculation (min)  46 min    Activity Tolerance  Patient tolerated treatment well;Patient limited by pain    Behavior During Therapy  [facility] for tasks assessed/performed       Past Medical History:  Diagnosis Date  . Abnormal Pap smear of cervix   . Allergic rhinitis   . Asthma   . GERD (gastroesophageal reflux disease)   . Headache(784.0)    tension- not an issue  . Hypertension     Past Surgical History:  Procedure Laterality Date  . BALLOON DILATION N/A 11/19/2015   Procedure: BALLOON DILATION;  Surgeon: Garlan Fair, MD;  Location: [facility];  Service: Endoscopy;  Laterality: N/A;  . BUNIONECTOMY Bilateral   . COLONOSCOPY WITH PROPOFOL N/A 10/05/2012   Procedure: COLONOSCOPY WITH PROPOFOL;  Surgeon: Garlan Fair, MD;  Location: [facility];  Service: Endoscopy;  Laterality: N/A;  . ESOPHAGOGASTRODUODENOSCOPY (EGD) WITH PROPOFOL N/A 11/19/2015   Procedure: ESOPHAGOGASTRODUODENOSCOPY (EGD) WITH PROPOFOL;  Surgeon: Garlan Fair, MD;  Location: [facility];  Service: Endoscopy;  Laterality: N/A;  . TUBAL LIGATION  27 years ago    There were no vitals filed for this visit.   Subjective Assessment - 08/07/17 0805    Subjective  pt is a 57 y.o F with CC of R low back pain with referral to the lateral aspect to the R promximal lateral hip. She reported this started when she was helping her sister out of the tub and noticed pain started the following day stays on the R  side, and November 2018. since then she had gotten an injection in the back with no relief. pain seems constant with minimal relief. no previous hx of low back pain.     Limitations  Standing;Sitting    How long can you sit comfortably?  30 min    How long can you stand comfortably?  30 min     How long can you walk comfortably?  30 min    Diagnostic tests  X-ray, MRI    Patient Stated Goals  relieve the pain, get back to exercise and return to walking on the treadmil    Currently in Pain?  Yes    Pain Score  8  last took meds for pain at 10 pm, at worst 10/10    Pain Location  Back    Pain Orientation  Right;Lower;Lateral    Pain Descriptors / Indicators  Burning;Cramping    Pain Type  Chronic pain    Pain Radiating Towards  R lateral aspect of the hip    Pain Onset  More than a month ago    Pain Frequency  Constant    Aggravating Factors   sitting / standing for long periods of time, bending forward, laying down and rotating to the left while sleeping.    Pain Relieving Factors  tylenol/ advil, laying flat on the back, and laying on the  R side    Effect of Pain on Daily Activities  limited endurance with standing/ walking, and sititng         [facility] PT Assessment - 08/07/17 0802      Assessment   Medical Diagnosis  Low back pain with unspecified sciatica    Referring Provider  Dondra Prader NP    Onset Date/Surgical Date  -- November 2018    Hand Dominance  Right    Next MD Visit  make on PRN    Prior Therapy  yes unsure when she had it      Precautions   Precautions  None      Restrictions   Weight Bearing Restrictions  No      Balance Screen   Has the patient fallen in the past 6 months  No    Has the patient had a decrease in activity level because of a fear of falling?   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    Vocation Requirements  prolonged sitting    Leisure  getting on the treadmil, shopping      Cognition   Overall Cognitive Status  Within Functional Limits for tasks assessed      Observation/Other Assessments   Focus on Therapeutic Outcomes (FOTO)   60% limited 39% limited      Posture/Postural Control   Posture/Postural Control  Postural limitations    Postural Limitations  Rounded Shoulders;Forward head    Posture Comments  R lateral trunk shift      ROM / Strength   AROM / PROM / Strength  AROM;Strength      AROM   AROM Assessment Site  Lumbar    Lumbar Flexion  69    Lumbar Extension  20 reproduced pain to the outside of the hip    Lumbar - Right Side Bend  14 starts in 6 degrees of r side bending    Lumbar - Left Side Bend  4      Strength   Strength Assessment Site  Hip    Right/Left Hip  Right;Left    Right Hip Flexion  3+/5    Right Hip Extension  3/5    Right Hip External Rotation   4-/5    Right Hip Internal Rotation  4-/5    Right Hip ABduction  3+/5    Left Hip Flexion  4/5    Left Hip Extension  3/5    Left Hip External Rotation  4-/5    Left Hip Internal Rotation  4-/5    Left Hip ABduction  3/5 assessed in sidleying       Palpation   Palpation comment  TTP along the R PSIS and along the glute medius on the R  Special Tests    Special Tests  Lumbar    Lumbar Tests  Straight Leg Raise;Prone Knee Bend Test      Prone Knee Bend Test   Findings  Positive    Side  Right      Straight Leg Raise   Findings  Positive    Side   Right    Comment   ipsilateral pain       Ambulation/Gait   Ambulation/Gait  Yes    Gait Pattern  Step-through pattern;Decreased stance time - right;Decreased step length - right;Festinating;Antalgic;Trunk flexed             Objective measurements completed on examination: See above findings.      [facility] Treatment/Exercise - 08/07/17 0802      Knee/Hip Exercises: Stretches   ITB Stretch  Right;3 reps;30 seconds standing stepping L over R and leaing R hip against wall      Knee/Hip Exercises: Seated   Ball Squeeze  3 x 10 holding 5 sec             PT Education - 08/07/17 0849    Education provided  Yes    Education Details  evaluation findings, POC, goals, HEP with proper form. anatomy of the hip and SIJ and muscular effects on the joint.     Person(s) Educated  Patient    Methods  Explanation;Verbal cues;Handout    Comprehension  Verbalized understanding;Verbal cues required       PT Short Term Goals - 08/07/17 0857      PT SHORT TERM GOAL #1   Title  pt to be I with inital HEP     Time  3    Period  Weeks    Status  New    Target Date  08/28/17      PT SHORT TERM GOAL #2   Title  pt to demo proper posture in sitting/ standing and walking as well as lifting mechanics to prevent and reduce low back and hip pain    Time  3    Period  Weeks    Status  New    Target Date  08/28/17        PT Long Term Goals - 08/07/17 0858      PT LONG TERM GOAL #1   Title  pt to improve trunk flexion to >/= 80 degrees, and extension/ bil sidebending to >/= 20 degrees with </= 1/10 pain for functional mobility required for ADLs    Time  6    Period  Weeks    Status  New    Target Date  09/18/17      PT LONG TERM GOAL #2   Title  improve bil hip strength to >/= 4+/5 in all planes to provide hip stability and promote proper body mechanics with lifting activities     Time  6    Period  Weeks    Status  New    Target Date  09/18/17      PT LONG TERM GOAL #3   Title  pt to be  able to sit/ stand and walk for >/= 60 min with </= 1/10 pain positional endurance required for job related tasks and pt's goal of returning to exercise.    Time  6    Period  Weeks    Status  New    Target Date  09/18/17      PT LONG TERM GOAL #4   Title  Increase FOTO score to </= 39% limited to demo improvement in function    Time  6    Period  Weeks    Status  New    Target Date  09/18/17      PT LONG TERM GOAL #5   Title  pt to be I with all HEP given as of last visit to maintain and progress her level of function independenlty    Time  6    Period  Weeks    Status  New    Target Date  09/18/17             Plan - 08/07/17 9323    Clinical Impression Statement  pt presents to OPPT with CC of R low back pain that started following assisting her sister out of the tub in November 2018. She exhibits R lateral trunk shift and limited trunk mobility specifically with extension/ bil side bending. special testing is positive for SIJ involvement and pt demonstrates possible R inflare. She reported relief of pain with standing/ walking following glute med stretching and adductor activation. She would benefit from physical therapy to promote pelvic symmetry, reduce pain, and improve core/ hip strength for stability and return her to PLOF by addressing the deficits listed.     Clinical Presentation  Stable    Clinical Decision Making  Low    Rehab Potential  Good    PT Frequency  2x / week    PT Duration  6 weeks    PT Treatment/Interventions  ADLs/Self Care Home Management;Cryotherapy;Electrical Stimulation;Iontophoresis 4mg /ml Dexamethasone;Moist Heat;Traction;Ultrasound;Gait training;Therapeutic activities;Therapeutic exercise;Manual techniques;Taping;Dry needling;Passive range of motion;Patient/family education;Balance training    PT Next Visit Plan  review/ update HEP, possible inflare on R, glute med soft tissue work and stretching hip flexor/ adductor activation, core  strengthening/ modalities PRN    PT Home Exercise Plan  lower trunk rotation, ITB (glue med) stretch, adductor isometrics, hamstring stretching.     Consulted and Agree with Plan of Care  Patient       Patient will benefit from skilled therapeutic intervention in order to improve the following deficits and impairments:  Pain, Abnormal gait, Decreased endurance, Decreased balance, Decreased activity tolerance, Decreased strength, Decreased range of motion, Postural dysfunction, Improper body mechanics, Increased fascial restricitons  Visit Diagnosis: Chronic right-sided low back pain, with sciatica presence unspecified  Muscle weakness (generalized)  Abnormal posture     Problem List Patient Active Problem List   Diagnosis Date Noted  . Class 1 obesity without serious comorbidity with body mass index (BMI) of 31.0 to 31.9 in adult 10/21/2016  . Chronic constipation 10/21/2016  . Seasonal and perennial allergic rhinitis 10/05/2007  . Asthma, mild intermittent, well-controlled 10/05/2007   Starr Lake PT, DPT, LAT, ATC  08/07/17  9:02 AM      [facility] [address] [place], [place], [address] Phone: [phone]   Fax:  [phone]  Name: Victoria [name] MRN: [id number] Date of Birth: 05-03-1961

## 2017-08-10 ENCOUNTER — Encounter: Payer: Self-pay | Admitting: Physical Therapy

## 2017-08-10 ENCOUNTER — Ambulatory Visit: Payer: 59 | Admitting: Physical Therapy

## 2017-08-10 DIAGNOSIS — G8929 Other chronic pain: Secondary | ICD-10-CM

## 2017-08-10 DIAGNOSIS — M6281 Muscle weakness (generalized): Secondary | ICD-10-CM

## 2017-08-10 DIAGNOSIS — M545 Low back pain: Principal | ICD-10-CM

## 2017-08-10 DIAGNOSIS — R293 Abnormal posture: Secondary | ICD-10-CM

## 2017-08-10 NOTE — Therapy (Signed)
[place], [place], [address] Phone: [phone]   Fax:  [phone]  Physical Therapy Treatment  Patient Details  Name: Victoria [name] MRN: [id number] Date of Birth: Jan 14, 1961 Referring Provider: Dondra Prader NP   Encounter Date: 08/10/2017  PT End of Session - 08/10/17 0718    Visit Number  2    Number of Visits  13    Date for PT Re-Evaluation  09/18/17    PT Start Time  0715    PT Stop Time  0810    PT Time Calculation (min)  55 min       Past Medical History:  Diagnosis Date  . Abnormal Pap smear of cervix   . Allergic rhinitis   . Asthma   . GERD (gastroesophageal reflux disease)   . Headache(784.0)    tension- not an issue  . Hypertension     Past Surgical History:  Procedure Laterality Date  . BALLOON DILATION N/A 11/19/2015   Procedure: BALLOON DILATION;  Surgeon: Garlan Fair, MD;  Location: [facility];  Service: Endoscopy;  Laterality: N/A;  . BUNIONECTOMY Bilateral   . COLONOSCOPY WITH PROPOFOL N/A 10/05/2012   Procedure: COLONOSCOPY WITH PROPOFOL;  Surgeon: Garlan Fair, MD;  Location: [facility];  Service: Endoscopy;  Laterality: N/A;  . ESOPHAGOGASTRODUODENOSCOPY (EGD) WITH PROPOFOL N/A 11/19/2015   Procedure: ESOPHAGOGASTRODUODENOSCOPY (EGD) WITH PROPOFOL;  Surgeon: Garlan Fair, MD;  Location: [facility];  Service: Endoscopy;  Laterality: N/A;  . TUBAL LIGATION  27 years ago    There were no vitals filed for this visit.  Subjective Assessment - 08/10/17 0717    Subjective  I feel better.     Currently in Pain?  Yes    Pain Score  6     Pain Location  Back    Pain Orientation  Right;Lower    Pain Descriptors / Indicators  Aching                      [facility] Adult PT Treatment/Exercise - 08/10/17 0001      Lumbar Exercises: Stretches   Lower Trunk Rotation  10 seconds;5 reps      Lumbar Exercises: Supine   Pelvic Tilt  10 reps     Bridge  5 reps pain    Other Supine Lumbar Exercises  --      Knee/Hip Exercises: Stretches   Active Hamstring Stretch  3 reps;30 seconds    ITB Stretch  Right;3 reps;30 seconds standing stepping L over R and leaing R hip against wall    Piriformis Stretch  3 reps;30 seconds IR x 3, ER x 3       Knee/Hip Exercises: Seated   Ball Squeeze  3 x 10 holding 5 sec      Knee/Hip Exercises: Supine   Straight Leg Raises  10 reps with ab set       Modalities   Modalities  Moist Heat      Moist Heat Therapy   Number Minutes Moist Heat  10 Minutes    Moist Heat Location  Lumbar Spine right lower in hooklying      Manual Therapy   Manual Therapy  Soft tissue mobilization    Soft tissue mobilization  Tennis ball to right glute med in sidelying- increased pain in this position              PT Education - 08/10/17 0906    Education  provided  Yes    Education Details  HEP    Person(s) Educated  Patient    Methods  Explanation;Handout    Comprehension  Verbalized understanding       PT Short Term Goals - 08/07/17 0857      PT SHORT TERM GOAL #1   Title  pt to be I with inital HEP     Time  3    Period  Weeks    Status  New    Target Date  08/28/17      PT SHORT TERM GOAL #2   Title  pt to demo proper posture in sitting/ standing and walking as well as lifting mechanics to prevent and reduce low back and hip pain    Time  3    Period  Weeks    Status  New    Target Date  08/28/17        PT Long Term Goals - 08/07/17 0858      PT LONG TERM GOAL #1   Title  pt to improve trunk flexion to >/= 80 degrees, and extension/ bil sidebending to >/= 20 degrees with </= 1/10 pain for functional mobility required for ADLs    Time  6    Period  Weeks    Status  New    Target Date  09/18/17      PT LONG TERM GOAL #2   Title  improve bil hip strength to >/= 4+/5 in all planes to provide hip stability and promote proper body mechanics with lifting activities     Time  6    Period   Weeks    Status  New    Target Date  09/18/17      PT LONG TERM GOAL #3   Title  pt to be able to sit/ stand and walk for >/= 60 min with </= 1/10 pain positional endurance required for job related tasks and pt's goal of returning to exercise.    Time  6    Period  Weeks    Status  New    Target Date  09/18/17      PT LONG TERM GOAL #4   Title  Increase FOTO score to </= 39% limited to demo improvement in function    Time  6    Period  Weeks    Status  New    Target Date  09/18/17      PT LONG TERM GOAL #5   Title  pt to be I with all HEP given as of last visit to maintain and progress her level of function independenlty    Time  6    Period  Weeks    Status  New    Target Date  09/18/17            Plan - 08/10/17 0907    Clinical Impression Statement  Pt reports less pain since last visit. Reviewed HEP and progressed piriformis/glute med stretching. Soft tissue work to lateral hip in sodelying however patient became achey in this position. She reports soreness at end of session. HMP used at end of treatment.     PT Next Visit Plan  review/ update HEP, possible inflare on R, glute med soft tissue work and stretching hip flexor/ adductor activation, core strengthening/ modalities PRN    PT Home Exercise Plan  lower trunk rotation, ITB (glue med) stretch, adductor isometrics, hamstring stretching. piriformis IR/ER stretching supine verses seated  Consulted and Agree with Plan of Care  Patient       Patient will benefit from skilled therapeutic intervention in order to improve the following deficits and impairments:  Pain, Abnormal gait, Decreased endurance, Decreased balance, Decreased activity tolerance, Decreased strength, Decreased range of motion, Postural dysfunction, Improper body mechanics, Increased fascial restricitons  Visit Diagnosis: Chronic right-sided low back pain, with sciatica presence unspecified  Muscle weakness (generalized)  Abnormal  posture     Problem List Patient Active Problem List   Diagnosis Date Noted  . Class 1 obesity without serious comorbidity with body mass index (BMI) of 31.0 to 31.9 in adult 10/21/2016  . Chronic constipation 10/21/2016  . Seasonal and perennial allergic rhinitis 10/05/2007  . Asthma, mild intermittent, well-controlled 10/05/2007    Dorene Ar, PTA 08/10/2017, 9:46 AM  [place], [place], [address] Phone: [phone]   Fax:  [phone]  Name: Victoria [name] MRN: [id number] Date of Birth: 02/06/1961

## 2017-08-18 ENCOUNTER — Telehealth (INDEPENDENT_AMBULATORY_CARE_PROVIDER_SITE_OTHER): Payer: Self-pay | Admitting: Orthopedic Surgery

## 2017-08-18 NOTE — Telephone Encounter (Signed)
Patient called asking for the results of her MRI. CB # [phone]

## 2017-08-19 ENCOUNTER — Other Ambulatory Visit (INDEPENDENT_AMBULATORY_CARE_PROVIDER_SITE_OTHER): Payer: Self-pay | Admitting: Family

## 2017-08-19 NOTE — Telephone Encounter (Signed)
Victoria [name] will you call pt? It looks like she has had result review with [facility] when she was refereed for injection.  Please see below and advise.

## 2017-08-20 ENCOUNTER — Other Ambulatory Visit (INDEPENDENT_AMBULATORY_CARE_PROVIDER_SITE_OTHER): Payer: Self-pay | Admitting: Family

## 2017-08-21 ENCOUNTER — Ambulatory Visit: Payer: 59 | Admitting: Physical Therapy

## 2017-08-21 ENCOUNTER — Encounter: Payer: Self-pay | Admitting: Physical Therapy

## 2017-08-21 DIAGNOSIS — M545 Low back pain: Principal | ICD-10-CM

## 2017-08-21 DIAGNOSIS — G8929 Other chronic pain: Secondary | ICD-10-CM

## 2017-08-21 DIAGNOSIS — R293 Abnormal posture: Secondary | ICD-10-CM

## 2017-08-21 DIAGNOSIS — M6281 Muscle weakness (generalized): Secondary | ICD-10-CM

## 2017-08-21 NOTE — Patient Instructions (Signed)

## 2017-08-21 NOTE — Therapy (Signed)
[place], [place], [address] Phone: [phone]   Fax:  [phone]  Physical Therapy Treatment  Patient Details  Name: Victoria [name] MRN: [id number] Date of Birth: 28-Jun-1961 Referring Provider: Dondra Prader NP   Encounter Date: 08/21/2017  PT End of Session - 08/21/17 0725    Visit Number  3    Number of Visits  13    Date for PT Re-Evaluation  09/18/17    PT Start Time  0716    PT Stop Time  0812    PT Time Calculation (min)  56 min       Past Medical History:  Diagnosis Date  . Abnormal Pap smear of cervix   . Allergic rhinitis   . Asthma   . GERD (gastroesophageal reflux disease)   . Headache(784.0)    tension- not an issue  . Hypertension     Past Surgical History:  Procedure Laterality Date  . BALLOON DILATION N/A 11/19/2015   Procedure: BALLOON DILATION;  Surgeon: Garlan Fair, MD;  Location: [facility];  Service: Endoscopy;  Laterality: N/A;  . BUNIONECTOMY Bilateral   . COLONOSCOPY WITH PROPOFOL N/A 10/05/2012   Procedure: COLONOSCOPY WITH PROPOFOL;  Surgeon: Garlan Fair, MD;  Location: [facility];  Service: Endoscopy;  Laterality: N/A;  . ESOPHAGOGASTRODUODENOSCOPY (EGD) WITH PROPOFOL N/A 11/19/2015   Procedure: ESOPHAGOGASTRODUODENOSCOPY (EGD) WITH PROPOFOL;  Surgeon: Garlan Fair, MD;  Location: [facility];  Service: Endoscopy;  Laterality: N/A;  . TUBAL LIGATION  27 years ago    There were no vitals filed for this visit.  Subjective Assessment - 08/21/17 0718    Subjective  About the same, I think the weather makes the arthritis ache more. MD said I have bulging discs at two levels.     Currently in Pain?  Yes    Pain Score  8     Pain Location  Back    Pain Orientation  Right;Lower    Pain Descriptors / Indicators  Aching    Pain Radiating Towards  R lateral hip     Aggravating Factors   sitting/ standing long periods, bending forward     Pain  Relieving Factors  OTC meds, laying flat, right side lying                       [facility] Adult PT Treatment/Exercise - 08/21/17 0001      Lumbar Exercises: Stretches   Single Knee to Chest Stretch  3 reps;30 seconds    Lower Trunk Rotation  10 seconds;5 reps    Standing Extension  5 reps;10 seconds    Prone on Elbows Stretch  3 reps;10 seconds prone lying 20 sec x 3       Lumbar Exercises: Aerobic   Stationary Bike  Nustep L5 x 7 min      Lumbar Exercises: Supine   Pelvic Tilt  10 reps      Knee/Hip Exercises: Stretches   Active Hamstring Stretch  3 reps;30 seconds    ITB Stretch  3 reps;30 seconds strap supine      Modalities   Modalities  Electrical Stimulation      Moist Heat Therapy   Number Minutes Moist Heat  15 Minutes    Moist Heat Location  Lumbar Spine      Electrical Stimulation   Electrical Stimulation Location  Lumbar    Electrical Stimulation Action  IFC x 15 min  Electrical Stimulation Parameters  78ma    Electrical Stimulation Goals  Pain               PT Short Term Goals - 08/07/17 0857      PT SHORT TERM GOAL #1   Title  pt to be I with inital HEP     Time  3    Period  Weeks    Status  New    Target Date  08/28/17      PT SHORT TERM GOAL #2   Title  pt to demo proper posture in sitting/ standing and walking as well as lifting mechanics to prevent and reduce low back and hip pain    Time  3    Period  Weeks    Status  New    Target Date  08/28/17        PT Long Term Goals - 08/07/17 0858      PT LONG TERM GOAL #1   Title  pt to improve trunk flexion to >/= 80 degrees, and extension/ bil sidebending to >/= 20 degrees with </= 1/10 pain for functional mobility required for ADLs    Time  6    Period  Weeks    Status  New    Target Date  09/18/17      PT LONG TERM GOAL #2   Title  improve bil hip strength to >/= 4+/5 in all planes to provide hip stability and promote proper body mechanics with lifting activities      Time  6    Period  Weeks    Status  New    Target Date  09/18/17      PT LONG TERM GOAL #3   Title  pt to be able to sit/ stand and walk for >/= 60 min with </= 1/10 pain positional endurance required for job related tasks and pt's goal of returning to exercise.    Time  6    Period  Weeks    Status  New    Target Date  09/18/17      PT LONG TERM GOAL #4   Title  Increase FOTO score to </= 39% limited to demo improvement in function    Time  6    Period  Weeks    Status  New    Target Date  09/18/17      PT LONG TERM GOAL #5   Title  pt to be I with all HEP given as of last visit to maintain and progress her level of function independenlty    Time  6    Period  Weeks    Status  New    Target Date  09/18/17            Plan - 08/21/17 0719    Clinical Impression Statement  Pt reports she asked her MD to clarify her MRI and she was told that she had bulging disks. She reports she has been on predisone and had an injection without benefit. She did not realize these treatments were for bulging discs as she was only told she had arthritis before. Pt reports decreased pain with rolling left during bed mobility. Maybe a little improvement in standing and sitting tolerance. Began prone lying to prone on elbows which increased low back pain and decreased buttock/hip pain. IFC used to calm down back pain. Pt to try standing and prone extension throughout next several days and see if she improves.  PT Next Visit Plan  How was IFC? How were extension exercises? review/ update HEP, possible inflare on R, glute med soft tissue work and stretching hip flexor/ adductor activation, core strengthening/ modalities PRN    PT Home Exercise Plan  lower trunk rotation, ITB (glue med) stretch, adductor isometrics, hamstring stretching. piriformis IR/ER stretching supine verses seated    Consulted and Agree with Plan of Care  Patient       Patient will benefit from skilled therapeutic  intervention in order to improve the following deficits and impairments:  Pain, Abnormal gait, Decreased endurance, Decreased balance, Decreased activity tolerance, Decreased strength, Decreased range of motion, Postural dysfunction, Improper body mechanics, Increased fascial restricitons  Visit Diagnosis: Chronic right-sided low back pain, with sciatica presence unspecified  Muscle weakness (generalized)  Abnormal posture     Problem List Patient Active Problem List   Diagnosis Date Noted  . Class 1 obesity without serious comorbidity with body mass index (BMI) of 31.0 to 31.9 in adult 10/21/2016  . Chronic constipation 10/21/2016  . Seasonal and perennial allergic rhinitis 10/05/2007  . Asthma, mild intermittent, well-controlled 10/05/2007    Dorene Ar , PTA 08/21/2017, 12:45 PM  [place], [place], [address] Phone: [phone]   Fax:  [phone]  Name: Carlyn Lemke MRN: [id number] Date of Birth: Jan 31, 1961

## 2017-08-26 ENCOUNTER — Encounter: Payer: Self-pay | Admitting: Physical Therapy

## 2017-08-26 ENCOUNTER — Ambulatory Visit: Payer: 59 | Admitting: Physical Therapy

## 2017-08-26 DIAGNOSIS — M545 Low back pain: Secondary | ICD-10-CM | POA: Diagnosis not present

## 2017-08-26 DIAGNOSIS — G8929 Other chronic pain: Secondary | ICD-10-CM

## 2017-08-26 DIAGNOSIS — M6281 Muscle weakness (generalized): Secondary | ICD-10-CM

## 2017-08-26 DIAGNOSIS — R293 Abnormal posture: Secondary | ICD-10-CM

## 2017-08-26 NOTE — Therapy (Signed)
[place], [place], [address] Phone: [phone]   Fax:  [phone]  Physical Therapy Treatment  Patient Details  Name: Victoria [name] MRN: [id number] Date of Birth: 12-07-1960 Referring Provider: Dondra Prader NP   Encounter Date: 08/26/2017  PT End of Session - 08/26/17 0727    Visit Number  4    Number of Visits  13    Date for PT Re-Evaluation  09/18/17    PT Start Time  0715    PT Stop Time  0810    PT Time Calculation (min)  55 min       Past Medical History:  Diagnosis Date  . Abnormal Pap smear of cervix   . Allergic rhinitis   . Asthma   . GERD (gastroesophageal reflux disease)   . Headache(784.0)    tension- not an issue  . Hypertension     Past Surgical History:  Procedure Laterality Date  . BALLOON DILATION N/A 11/19/2015   Procedure: BALLOON DILATION;  Surgeon: Garlan Fair, MD;  Location: [facility];  Service: Endoscopy;  Laterality: N/A;  . BUNIONECTOMY Bilateral   . COLONOSCOPY WITH PROPOFOL N/A 10/05/2012   Procedure: COLONOSCOPY WITH PROPOFOL;  Surgeon: Garlan Fair, MD;  Location: [facility];  Service: Endoscopy;  Laterality: N/A;  . ESOPHAGOGASTRODUODENOSCOPY (EGD) WITH PROPOFOL N/A 11/19/2015   Procedure: ESOPHAGOGASTRODUODENOSCOPY (EGD) WITH PROPOFOL;  Surgeon: Garlan Fair, MD;  Location: [facility];  Service: Endoscopy;  Laterality: N/A;  . TUBAL LIGATION  27 years ago    There were no vitals filed for this visit.  Subjective Assessment - 08/26/17 0717    Subjective  The back extension hurt but they help.     Currently in Pain?  Yes    Pain Score  5     Pain Location  Back    Pain Orientation  Right;Lower    Pain Descriptors / Indicators  Aching    Pain Type  Chronic pain                      [facility] Adult PT Treatment/Exercise - 08/26/17 0001      Lumbar Exercises: Stretches   Prone on Elbows Stretch  60 seconds    Press Ups   5 reps;10 seconds    Other Lumbar Stretch Exercise  QL stretch sidelying x 2 minutes each side.       Lumbar Exercises: Aerobic   Stationary Bike  Nustep L5 x 5 min      Lumbar Exercises: Supine   Clam  20 reps red band     Bridge  10 reps      Lumbar Exercises: Prone   Other Prone Lumbar Exercises  Prone Tra Contract 5 sec x 10 with hamstring curl and hip extension  increased pain with right hip extension      Moist Heat Therapy   Number Minutes Moist Heat  15 Minutes    Moist Heat Location  Lumbar Spine      Electrical Stimulation   Electrical Stimulation Location  Lumbar    Electrical Stimulation Action  IFC x 15 min    Electrical Stimulation Parameters  to tolerance     Electrical Stimulation Goals  Pain             PT Education - 08/26/17 0800    Education provided  Yes    Education Details  HEP    Person(s) Educated  Patient  Methods  Explanation;Handout    Comprehension  Verbalized understanding       PT Short Term Goals - 08/07/17 0857      PT SHORT TERM GOAL #1   Title  pt to be I with inital HEP     Time  3    Period  Weeks    Status  New    Target Date  08/28/17      PT SHORT TERM GOAL #2   Title  pt to demo proper posture in sitting/ standing and walking as well as lifting mechanics to prevent and reduce low back and hip pain    Time  3    Period  Weeks    Status  New    Target Date  08/28/17        PT Long Term Goals - 08/07/17 0858      PT LONG TERM GOAL #1   Title  pt to improve trunk flexion to >/= 80 degrees, and extension/ bil sidebending to >/= 20 degrees with </= 1/10 pain for functional mobility required for ADLs    Time  6    Period  Weeks    Status  New    Target Date  09/18/17      PT LONG TERM GOAL #2   Title  improve bil hip strength to >/= 4+/5 in all planes to provide hip stability and promote proper body mechanics with lifting activities     Time  6    Period  Weeks    Status  New    Target Date  09/18/17       PT LONG TERM GOAL #3   Title  pt to be able to sit/ stand and walk for >/= 60 min with </= 1/10 pain positional endurance required for job related tasks and pt's goal of returning to exercise.    Time  6    Period  Weeks    Status  New    Target Date  09/18/17      PT LONG TERM GOAL #4   Title  Increase FOTO score to </= 39% limited to demo improvement in function    Time  6    Period  Weeks    Status  New    Target Date  09/18/17      PT LONG TERM GOAL #5   Title  pt to be I with all HEP given as of last visit to maintain and progress her level of function independenlty    Time  6    Period  Weeks    Status  New    Target Date  09/18/17            Plan - 08/26/17 6387    Clinical Impression Statement  Pt reports back pain still right sided however is centralizng to the back instead of wrapping around her side and into her lateral hip. Her pain level is lower today rated at 5/10. Progressed to prone pressups and added side bend stretch to increased Lumbar AROM. IFC repeated as pt reports this was helpful last visit.     PT Next Visit Plan  cont extension biased progression;  review/ update HEP, possible inflare on R, glute med soft tissue work and stretching hip flexor/ adductor activation, core strengthening/ modalities prn.     PT Home Exercise Plan  lower trunk rotation, ITB (glue med) stretch, adductor isometrics, hamstring stretching. piriformis IR/ER stretching supine verses seated, POE, prone press  ups, side lying QL stretch.     Consulted and Agree with Plan of Care  Patient       Patient will benefit from skilled therapeutic intervention in order to improve the following deficits and impairments:  Pain, Abnormal gait, Decreased endurance, Decreased balance, Decreased activity tolerance, Decreased strength, Decreased range of motion, Postural dysfunction, Improper body mechanics, Increased fascial restricitons  Visit Diagnosis: Chronic right-sided low back pain, with  sciatica presence unspecified  Muscle weakness (generalized)  Abnormal posture     Problem List Patient Active Problem List   Diagnosis Date Noted  . Class 1 obesity without serious comorbidity with body mass index (BMI) of 31.0 to 31.9 in adult 10/21/2016  . Chronic constipation 10/21/2016  . Seasonal and perennial allergic rhinitis 10/05/2007  . Asthma, mild intermittent, well-controlled 10/05/2007    Dorene Ar , PTA 08/26/2017, 8:19 AM  [place], [place], [address] Phone: [phone]   Fax:  [phone]  Name: Victoria [name] MRN: [id number] Date of Birth: 1961-02-23

## 2017-08-28 ENCOUNTER — Encounter: Payer: Self-pay | Admitting: Physical Therapy

## 2017-08-28 ENCOUNTER — Ambulatory Visit: Payer: 59 | Attending: Family Medicine | Admitting: Physical Therapy

## 2017-08-28 DIAGNOSIS — R293 Abnormal posture: Secondary | ICD-10-CM | POA: Diagnosis present

## 2017-08-28 DIAGNOSIS — M6281 Muscle weakness (generalized): Secondary | ICD-10-CM | POA: Insufficient documentation

## 2017-08-28 DIAGNOSIS — M545 Low back pain: Secondary | ICD-10-CM | POA: Insufficient documentation

## 2017-08-28 DIAGNOSIS — G8929 Other chronic pain: Secondary | ICD-10-CM | POA: Diagnosis present

## 2017-08-28 NOTE — Therapy (Signed)
[place], [place], [address] Phone: [phone]   Fax:  [phone]  Physical Therapy Treatment  Patient Details  Name: Shanise Balch MRN: [id number] Date of Birth: 19-Mar-1961 Referring Provider: Dondra Prader NP   Encounter Date: 08/28/2017  PT End of Session - 08/28/17 0721    Visit Number  5    Number of Visits  13    Date for PT Re-Evaluation  09/18/17    PT Start Time  0715    PT Stop Time  0810    PT Time Calculation (min)  55 min    Activity Tolerance  Patient tolerated treatment well    Behavior During Therapy  [facility] for tasks assessed/performed       Past Medical History:  Diagnosis Date  . Abnormal Pap smear of cervix   . Allergic rhinitis   . Asthma   . GERD (gastroesophageal reflux disease)   . Headache(784.0)    tension- not an issue  . Hypertension     Past Surgical History:  Procedure Laterality Date  . BALLOON DILATION N/A 11/19/2015   Procedure: BALLOON DILATION;  Surgeon: Garlan Fair, MD;  Location: [facility];  Service: Endoscopy;  Laterality: N/A;  . BUNIONECTOMY Bilateral   . COLONOSCOPY WITH PROPOFOL N/A 10/05/2012   Procedure: COLONOSCOPY WITH PROPOFOL;  Surgeon: Garlan Fair, MD;  Location: [facility];  Service: Endoscopy;  Laterality: N/A;  . ESOPHAGOGASTRODUODENOSCOPY (EGD) WITH PROPOFOL N/A 11/19/2015   Procedure: ESOPHAGOGASTRODUODENOSCOPY (EGD) WITH PROPOFOL;  Surgeon: Garlan Fair, MD;  Location: [facility];  Service: Endoscopy;  Laterality: N/A;  . TUBAL LIGATION  27 years ago    There were no vitals filed for this visit.  Subjective Assessment - 08/28/17 0718    Subjective  Patient reports her back is getting better. She has been working on her extensions.     Limitations  Standing;Sitting    How long can you sit comfortably?  30 min    How long can you stand comfortably?  30 min     How long can you walk comfortably?  30 min    Diagnostic tests  X-ray, MRI    Patient Stated Goals  relieve the pain, get back to exercise and return to walking on the treadmil    Currently in Pain?  Yes    Pain Score  3     Pain Location  Back    Pain Orientation  Right;Lower    Pain Descriptors / Indicators  Aching    Pain Type  Chronic pain    Pain Onset  More than a month ago    Pain Frequency  Constant    Aggravating Factors   sitting and standing for long periods     Pain Relieving Factors  OTC meds, lying flat     Effect of Pain on Daily Activities  limited endutrance                       [facility] Adult PT Treatment/Exercise - 08/28/17 0001      Lumbar Exercises: Stretches   Lower Trunk Rotation  10 seconds;5 reps    Prone on Elbows Stretch  60 seconds    Press Ups  5 reps;10 seconds      Lumbar Exercises: Aerobic   Stationary Bike  Nustep L5 x 5 min      Lumbar Exercises: Supine   Clam  20 reps red band  Bridge  10 reps      Knee/Hip Exercises: [employer] operational officer  3 reps;30 seconds      Knee/Hip Exercises: Supine   Straight Leg Raises  10 reps with ab set       Modalities   Modalities  Electrical Stimulation      Moist Heat Therapy   Number Minutes Moist Heat  15 Minutes    Moist Heat Location  Lumbar Spine      Electrical Stimulation   Electrical Stimulation Location  Lumbar    Electrical Stimulation Action  IFCx15 min to tolerance     Electrical Stimulation Parameters  to tolerance     Electrical Stimulation Goals  Pain      Manual Therapy   Soft tissue mobilization  LAD to right LE. Improved pain              PT Education - 08/28/17 0720    Education provided  Yes    Education Details  reviewed HEP     Person(s) Educated  Patient    Methods  Explanation;Demonstration;Tactile cues;Verbal cues    Comprehension  Verbalized understanding;Returned demonstration;Verbal cues required;Tactile cues required       PT Short Term Goals - 08/28/17 0956      PT  SHORT TERM GOAL #1   Title  pt to be I with inital HEP     Baseline  working on it at home     Time  3    Period  Weeks    Status  On-going      PT SHORT TERM GOAL #2   Title  pt to demo proper posture in sitting/ standing and walking as well as lifting mechanics to prevent and reduce low back and hip pain    Time  3    Period  Weeks    Status  On-going        PT Long Term Goals - 08/07/17 9024      PT LONG TERM GOAL #1   Title  pt to improve trunk flexion to >/= 80 degrees, and extension/ bil sidebending to >/= 20 degrees with </= 1/10 pain for functional mobility required for ADLs    Time  6    Period  Weeks    Status  New    Target Date  09/18/17      PT LONG TERM GOAL #2   Title  improve bil hip strength to >/= 4+/5 in all planes to provide hip stability and promote proper body mechanics with lifting activities     Time  6    Period  Weeks    Status  New    Target Date  09/18/17      PT LONG TERM GOAL #3   Title  pt to be able to sit/ stand and walk for >/= 60 min with </= 1/10 pain positional endurance required for job related tasks and pt's goal of returning to exercise.    Time  6    Period  Weeks    Status  New    Target Date  09/18/17      PT LONG TERM GOAL #4   Title  Increase FOTO score to </= 39% limited to demo improvement in function    Time  6    Period  Weeks    Status  New    Target Date  09/18/17      PT LONG TERM GOAL #5   Title  pt to be I with all HEP given as of last visit to maintain and progress her level of function independenlty    Time  6    Period  Weeks    Status  New    Target Date  09/18/17            Plan - 08/28/17 2778    Clinical Impression Statement  Patient continues to make progress. she had some pain with press ups but it was minor. She tolerated exercises well. She is still tender to palkpation in the upper glut and lumbar area. She was encouraged to continue with her HEP.     Clinical Presentation  Stable     Clinical Decision Making  Low    Rehab Potential  Good    PT Frequency  2x / week    PT Duration  6 weeks    PT Treatment/Interventions  ADLs/Self Care Home Management;Cryotherapy;Electrical Stimulation;Iontophoresis 4mg /ml Dexamethasone;Moist Heat;Traction;Ultrasound;Gait training;Therapeutic activities;Therapeutic exercise;Manual techniques;Taping;Dry needling;Passive range of motion;Patient/family education;Balance training    PT Next Visit Plan  cont extension biased progression;  review/ update HEP, possible inflare on R, glute med soft tissue work and stretching hip flexor/ adductor activation, core strengthening/ modalities prn.     PT Home Exercise Plan  lower trunk rotation, ITB (glue med) stretch, adductor isometrics, hamstring stretching. piriformis IR/ER stretching supine verses seated, POE, prone press ups, side lying QL stretch.     Consulted and Agree with Plan of Care  Patient       Patient will benefit from skilled therapeutic intervention in order to improve the following deficits and impairments:  Pain, Abnormal gait, Decreased endurance, Decreased balance, Decreased activity tolerance, Decreased strength, Decreased range of motion, Postural dysfunction, Improper body mechanics, Increased fascial restricitons  Visit Diagnosis: Chronic right-sided low back pain, with sciatica presence unspecified  Muscle weakness (generalized)  Abnormal posture     Problem List Patient Active Problem List   Diagnosis Date Noted  . Class 1 obesity without serious comorbidity with body mass index (BMI) of 31.0 to 31.9 in adult 10/21/2016  . Chronic constipation 10/21/2016  . Seasonal and perennial allergic rhinitis 10/05/2007  . Asthma, mild intermittent, well-controlled 10/05/2007    Carney Living PT DPT  08/28/2017, 9:58 AM  [facility] [address] [place], [place], [address] Phone: [phone]   Fax:  [phone]  Name:  Jonia Oakey MRN: [id number] Date of Birth: Mar 03, 1961

## 2017-08-31 ENCOUNTER — Ambulatory Visit: Payer: 59 | Admitting: Physical Therapy

## 2017-08-31 ENCOUNTER — Encounter: Payer: Self-pay | Admitting: Physical Therapy

## 2017-08-31 DIAGNOSIS — M6281 Muscle weakness (generalized): Secondary | ICD-10-CM

## 2017-08-31 DIAGNOSIS — M545 Low back pain: Principal | ICD-10-CM

## 2017-08-31 DIAGNOSIS — R293 Abnormal posture: Secondary | ICD-10-CM

## 2017-08-31 DIAGNOSIS — G8929 Other chronic pain: Secondary | ICD-10-CM

## 2017-08-31 NOTE — Therapy (Signed)
[place], [place], [address] Phone: [phone]   Fax:  [phone]  Physical Therapy Treatment  Patient Details  Name: Victoria [name] MRN: [id number] Date of Birth: 1961-06-15 Referring Provider: Dondra Prader NP   Encounter Date: 08/31/2017  PT End of Session - 08/31/17 0719    Visit Number  6    Number of Visits  13    Date for PT Re-Evaluation  09/18/17    PT Start Time  0715    PT Stop Time  0813    PT Time Calculation (min)  58 min       Past Medical History:  Diagnosis Date  . Abnormal Pap smear of cervix   . Allergic rhinitis   . Asthma   . GERD (gastroesophageal reflux disease)   . Headache(784.0)    tension- not an issue  . Hypertension     Past Surgical History:  Procedure Laterality Date  . BALLOON DILATION N/A 11/19/2015   Procedure: BALLOON DILATION;  Surgeon: Garlan Fair, MD;  Location: [facility];  Service: Endoscopy;  Laterality: N/A;  . BUNIONECTOMY Bilateral   . COLONOSCOPY WITH PROPOFOL N/A 10/05/2012   Procedure: COLONOSCOPY WITH PROPOFOL;  Surgeon: Garlan Fair, MD;  Location: [facility];  Service: Endoscopy;  Laterality: N/A;  . ESOPHAGOGASTRODUODENOSCOPY (EGD) WITH PROPOFOL N/A 11/19/2015   Procedure: ESOPHAGOGASTRODUODENOSCOPY (EGD) WITH PROPOFOL;  Surgeon: Garlan Fair, MD;  Location: [facility];  Service: Endoscopy;  Laterality: N/A;  . TUBAL LIGATION  27 years ago    There were no vitals filed for this visit.  Subjective Assessment - 08/31/17 0718    Currently in Pain?  Yes    Pain Score  3     Pain Location  Back    Pain Orientation  Right;Lower    Pain Descriptors / Indicators  Aching         [facility] PT Assessment - 08/31/17 0001      Strength   Right Hip Flexion  4/5    Right Hip Extension  4-/5    Right Hip ABduction  4+/5    Left Hip Flexion  4/5    Left Hip Extension  4-/5    Left Hip ABduction  4+/5                   [facility] Adult PT Treatment/Exercise - 08/31/17 0001      Lumbar Exercises: Stretches   Prone on Elbows Stretch  60 seconds    Press Ups  5 reps;10 seconds    Other Lumbar Stretch Exercise  Right QL stretch x 2 minutes sidelying over pillow       Lumbar Exercises: Aerobic   Stationary Bike  Nustep L5 x 5 min      Lumbar Exercises: Prone   Other Prone Lumbar Exercises  Prone Tra Contract       Knee/Hip Exercises: Stretches   Piriformis Stretch  3 reps;30 seconds      Moist Heat Therapy   Number Minutes Moist Heat  15 Minutes    Moist Heat Location  Lumbar Spine      Electrical Stimulation   Electrical Stimulation Location  Lumbar    Electrical Stimulation Action  IFC x 15 min    Electrical Stimulation Parameters  12 ma    Electrical Stimulation Goals  Pain             PT Education - 08/31/17 0757    Education  provided  Yes    Education Details  Website for TENS purchase: [employer]    Person(s) Educated  Patient    Methods  Explanation    Comprehension  Verbalized understanding       PT Short Term Goals - 08/31/17 0720      PT SHORT TERM GOAL #1   Title  pt to be I with inital HEP     Time  3    Period  Weeks    Status  Achieved      PT SHORT TERM GOAL #2   Title  pt to demo proper posture in sitting/ standing and walking as well as lifting mechanics to prevent and reduce low back and hip pain    Time  3    Period  Weeks    Status  Achieved        PT Long Term Goals - 08/07/17 0802      PT LONG TERM GOAL #1   Title  pt to improve trunk flexion to >/= 80 degrees, and extension/ bil sidebending to >/= 20 degrees with </= 1/10 pain for functional mobility required for ADLs    Time  6    Period  Weeks    Status  New    Target Date  09/18/17      PT LONG TERM GOAL #2   Title  improve bil hip strength to >/= 4+/5 in all planes to provide hip stability and promote proper body mechanics with lifting activities     Time  6    Period   Weeks    Status  New    Target Date  09/18/17      PT LONG TERM GOAL #3   Title  pt to be able to sit/ stand and walk for >/= 60 min with </= 1/10 pain positional endurance required for job related tasks and pt's goal of returning to exercise.    Time  6    Period  Weeks    Status  New    Target Date  09/18/17      PT LONG TERM GOAL #4   Title  Increase FOTO score to </= 39% limited to demo improvement in function    Time  6    Period  Weeks    Status  New    Target Date  09/18/17      PT LONG TERM GOAL #5   Title  pt to be I with all HEP given as of last visit to maintain and progress her level of function independenlty    Time  6    Period  Weeks    Status  New    Target Date  09/18/17            Plan - 08/31/17 0738    Clinical Impression Statement  Pt reports overall decrease in level of pain however early mornings and late evenings, pain can be 8/10. She take NSAIDS which help. Went to the mall over the weekend. Able to stand walk for 1.5 hours without increased pain. She can demonstrate proper squat/hip hinge for body mechanics and is more mindful of body mechanics at home. STG#1, #2 met. hip strength improved. Progresing toward strength goal. Will recheck Lumbar ROM next visit. Pt given information about self purchase of TENS unit.     PT Next Visit Plan  FOTO soon; cont extension biased progression if helpful ;CHECK ROM;   review/ update HEP, possible inflare on  R, glute med soft tissue work and stretching hip flexor/ adductor activation, core strengthening/ modalities prn.     PT Home Exercise Plan  lower trunk rotation, ITB (glue med) stretch, adductor isometrics, hamstring stretching. piriformis IR/ER stretching supine verses seated, POE, prone press ups, side lying QL stretch.     Consulted and Agree with Plan of Care  Patient       Patient will benefit from skilled therapeutic intervention in order to improve the following deficits and impairments:  Pain, Abnormal  gait, Decreased endurance, Decreased balance, Decreased activity tolerance, Decreased strength, Decreased range of motion, Postural dysfunction, Improper body mechanics, Increased fascial restricitons  Visit Diagnosis: Chronic right-sided low back pain, with sciatica presence unspecified  Muscle weakness (generalized)  Abnormal posture     Problem List Patient Active Problem List   Diagnosis Date Noted  . Class 1 obesity without serious comorbidity with body mass index (BMI) of 31.0 to 31.9 in adult 10/21/2016  . Chronic constipation 10/21/2016  . Seasonal and perennial allergic rhinitis 10/05/2007  . Asthma, mild intermittent, well-controlled 10/05/2007    Dorene Ar, PTA 08/31/2017, 8:18 AM  [place], [place], [address] Phone: [phone]   Fax:  [phone]  Name: Syna Gad MRN: [id number] Date of Birth: 1961-02-07

## 2017-09-02 ENCOUNTER — Encounter: Payer: Self-pay | Admitting: Physical Therapy

## 2017-09-02 ENCOUNTER — Ambulatory Visit: Payer: 59 | Admitting: Physical Therapy

## 2017-09-02 DIAGNOSIS — G8929 Other chronic pain: Secondary | ICD-10-CM

## 2017-09-02 DIAGNOSIS — R293 Abnormal posture: Secondary | ICD-10-CM

## 2017-09-02 DIAGNOSIS — M6281 Muscle weakness (generalized): Secondary | ICD-10-CM

## 2017-09-02 DIAGNOSIS — M545 Low back pain: Secondary | ICD-10-CM | POA: Diagnosis not present

## 2017-09-02 NOTE — Therapy (Signed)
[place], [place], [address] Phone: [phone]   Fax:  [phone]  Physical Therapy Treatment  Patient Details  Name: Victoria [name] MRN: [id number] Date of Birth: [birth year] Referring Provider: Dondra Prader NP   Encounter Date: 09/02/2017  PT End of Session - 09/02/17 1535    Visit Number  7    Number of Visits  13    Date for PT Re-Evaluation  09/18/17    PT Start Time  0354    PT Stop Time  1630    PT Time Calculation (min)  55 min    Activity Tolerance  Patient tolerated treatment well    Behavior During Therapy  [facility] for tasks assessed/performed       Past Medical History:  Diagnosis Date  . Abnormal Pap smear of cervix   . Allergic rhinitis   . Asthma   . GERD (gastroesophageal reflux disease)   . Headache(784.0)    tension- not an issue  . Hypertension     Past Surgical History:  Procedure Laterality Date  . BALLOON DILATION N/A 11/19/2015   Procedure: BALLOON DILATION;  Surgeon: Garlan Fair, MD;  Location: [facility];  Service: Endoscopy;  Laterality: N/A;  . BUNIONECTOMY Bilateral   . COLONOSCOPY WITH PROPOFOL N/A 10/05/2012   Procedure: COLONOSCOPY WITH PROPOFOL;  Surgeon: Garlan Fair, MD;  Location: [facility];  Service: Endoscopy;  Laterality: N/A;  . ESOPHAGOGASTRODUODENOSCOPY (EGD) WITH PROPOFOL N/A 11/19/2015   Procedure: ESOPHAGOGASTRODUODENOSCOPY (EGD) WITH PROPOFOL;  Surgeon: Garlan Fair, MD;  Location: [facility];  Service: Endoscopy;  Laterality: N/A;  . TUBAL LIGATION  27 years ago    There were no vitals filed for this visit.  Subjective Assessment - 09/02/17 1534    Subjective  "Think everything is going good with PT, I am feeling more sore today and I have been having to keep taking medication today"     Currently in Pain?  Yes    Pain Score  6     Pain Location  Back    Pain Orientation  Right;Lower                       [facility] Adult PT Treatment/Exercise - 09/02/17 1539      Lumbar Exercises: Stretches   Press Ups  10 reps;10 seconds      Lumbar Exercises: Standing   Other Standing Lumbar Exercises  repeated standing extension 2 x 10 with hands on hips      Knee/Hip Exercises: Stretches   ITB Stretch  2 reps;Right;30 seconds L sidelying    Piriformis Stretch  2 reps;30 seconds      Knee/Hip Exercises: Aerobic   Nustep  L5 x 5 min UE/LE      Knee/Hip Exercises: Sidelying   Hip ADduction  Both;2 sets;10 reps squeezing pillow      Moist Heat Therapy   Number Minutes Moist Heat  10 Minutes    Moist Heat Location  Lumbar Spine in prone to faciliate extension bias      Electrical Stimulation   Electrical Stimulation Location  Lumbar    Electrical Stimulation Action  IFC    Electrical Stimulation Parameters  100% scan , 15 min at L10    Electrical Stimulation Goals  Pain      Manual Therapy   Manual Therapy  Joint mobilization    Manual therapy comments  skilled palpation and monitoring throughout TPDN  Joint Mobilization  LAD grade 4-5, innominate compression  grade 4 with pt perform addcutor activation    Soft tissue mobilization  IASTM over the R glute med and lumbar parapsinals       Trigger Point Dry Needling - 09/02/17 1550    Consent Given?  Yes    Education Handout Provided  Yes    Muscles Treated Upper Body  Longissimus    Muscles Treated Lower Body  Gluteus minimus    Longissimus Response  Twitch response elicited;Palpable increased muscle length R lumbar paraspinals    Gluteus Minimus Response  Twitch response elicited;Palpable increased muscle length glute medius            PT Education - 09/02/17 1541    Education provided  Yes    Education Details  muscle anatomy and referral patterns. What TPDN is, benefits, what to expect and after care.    Person(s) Educated  Patient    Methods  Explanation;Verbal cues;Handout    Comprehension   Verbalized understanding;Verbal cues required       PT Short Term Goals - 08/31/17 0720      PT SHORT TERM GOAL #1   Title  pt to be I with inital HEP     Time  3    Period  Weeks    Status  Achieved      PT SHORT TERM GOAL #2   Title  pt to demo proper posture in sitting/ standing and walking as well as lifting mechanics to prevent and reduce low back and hip pain    Time  3    Period  Weeks    Status  Achieved        PT Long Term Goals - 08/07/17 4010      PT LONG TERM GOAL #1   Title  pt to improve trunk flexion to >/= 80 degrees, and extension/ bil sidebending to >/= 20 degrees with </= 1/10 pain for functional mobility required for ADLs    Time  6    Period  Weeks    Status  New    Target Date  09/18/17      PT LONG TERM GOAL #2   Title  improve bil hip strength to >/= 4+/5 in all planes to provide hip stability and promote proper body mechanics with lifting activities     Time  6    Period  Weeks    Status  New    Target Date  09/18/17      PT LONG TERM GOAL #3   Title  pt to be able to sit/ stand and walk for >/= 60 min with </= 1/10 pain positional endurance required for job related tasks and pt's goal of returning to exercise.    Time  6    Period  Weeks    Status  New    Target Date  09/18/17      PT LONG TERM GOAL #4   Title  Increase FOTO score to </= 39% limited to demo improvement in function    Time  6    Period  Weeks    Status  New    Target Date  09/18/17      PT LONG TERM GOAL #5   Title  pt to be I with all HEP given as of last visit to maintain and progress her level of function independenlty    Time  6    Period  Weeks    Status  New    Target Date  09/18/17            Plan - 09/02/17 1606    Clinical Impression Statement  pt reports increaesd soreness in theback and hip today. educated and performed TPDN for the R glute med/ lumbar parapsinals followed with IASTM and hip mobilization. continued extension biased treatment in  standing due to pt favoring R side in prone. She was able to perform all exercises with no report of increased pain. MHP and E-stim utilized to calm down soreness from DN which she reproted pain dropped to 5/10 post session.     PT Next Visit Plan  FOTO, How as DN, cont extension biased progression if helpful ;CHECK ROM;   review/ update HEP, possible inflare on R, glute med soft tissue work and stretching hip flexor/ adductor activation, core strengthening/ modalities prn.     PT Home Exercise Plan  lower trunk rotation, ITB (glue med) stretch, adductor isometrics, hamstring stretching. piriformis IR/ER stretching supine verses seated, POE, prone press ups, side lying QL stretch.     Consulted and Agree with Plan of Care  Patient       Patient will benefit from skilled therapeutic intervention in order to improve the following deficits and impairments:  Pain, Abnormal gait, Decreased endurance, Decreased balance, Decreased activity tolerance, Decreased strength, Decreased range of motion, Postural dysfunction, Improper body mechanics, Increased fascial restricitons  Visit Diagnosis: Chronic right-sided low back pain, with sciatica presence unspecified  Muscle weakness (generalized)  Abnormal posture     Problem List Patient Active Problem List   Diagnosis Date Noted  . Class 1 obesity without serious comorbidity with body mass index (BMI) of 31.0 to 31.9 in adult 10/21/2016  . Chronic constipation 10/21/2016  . Seasonal and perennial allergic rhinitis 10/05/2007  . Asthma, mild intermittent, well-controlled 10/05/2007   Starr Lake PT, DPT, LAT, ATC  09/02/17  4:26 PM      [facility] [address] [place], [place], [address] Phone: [phone]   Fax:  [phone]  Name: Victoria [name] MRN: [id number] Date of Birth: [birth year]

## 2017-09-02 NOTE — Patient Instructions (Signed)

## 2017-09-07 ENCOUNTER — Ambulatory Visit: Payer: 59 | Admitting: Physical Therapy

## 2017-09-07 DIAGNOSIS — M6281 Muscle weakness (generalized): Secondary | ICD-10-CM

## 2017-09-07 DIAGNOSIS — G8929 Other chronic pain: Secondary | ICD-10-CM

## 2017-09-07 DIAGNOSIS — M545 Low back pain: Principal | ICD-10-CM

## 2017-09-07 NOTE — Therapy (Signed)
[place], [place], [address] Phone: [phone]   Fax:  [phone]  Physical Therapy Treatment  Patient Details  Name: Victoria [name] MRN: [id number] Date of Birth: [birth year] Referring Provider: Dondra Prader NP   Encounter Date: 09/07/2017  PT End of Session - 09/07/17 1634    Visit Number  8    Number of Visits  13    Date for PT Re-Evaluation  09/18/17    PT Start Time  1633    PT Stop Time  1720    PT Time Calculation (min)  47 min    Activity Tolerance  Patient tolerated treatment well    Behavior During Therapy  [facility] for tasks assessed/performed       Past Medical History:  Diagnosis Date  . Abnormal Pap smear of cervix   . Allergic rhinitis   . Asthma   . GERD (gastroesophageal reflux disease)   . Headache(784.0)    tension- not an issue  . Hypertension     Past Surgical History:  Procedure Laterality Date  . BALLOON DILATION N/A 11/19/2015   Procedure: BALLOON DILATION;  Surgeon: Garlan Fair, MD;  Location: [facility];  Service: Endoscopy;  Laterality: N/A;  . BUNIONECTOMY Bilateral   . COLONOSCOPY WITH PROPOFOL N/A 10/05/2012   Procedure: COLONOSCOPY WITH PROPOFOL;  Surgeon: Garlan Fair, MD;  Location: [facility];  Service: Endoscopy;  Laterality: N/A;  . ESOPHAGOGASTRODUODENOSCOPY (EGD) WITH PROPOFOL N/A 11/19/2015   Procedure: ESOPHAGOGASTRODUODENOSCOPY (EGD) WITH PROPOFOL;  Surgeon: Garlan Fair, MD;  Location: [facility];  Service: Endoscopy;  Laterality: N/A;  . TUBAL LIGATION  27 years ago    There were no vitals filed for this visit.  Subjective Assessment - 09/07/17 1640    Subjective  "I think DN didn't really help I am still feeling pretty sore"    Currently in Pain?  Yes    Pain Score  4     Pain Location  Back    Pain Orientation  Right;Lower    Pain Descriptors / Indicators  Aching    Pain Type  Chronic pain    Pain Onset  More than a month  ago    Pain Frequency  Constant    Aggravating Factors   sitting and standing for longer periods of time    Pain Relieving Factors  OTC Meds, lying flat         [facility] PT Assessment - 09/07/17 1648      Observation/Other Assessments   Focus on Therapeutic Outcomes (FOTO)   48% limited      AROM   Lumbar Flexion  60    Lumbar Extension  20    Lumbar - Right Side Bend  13    Lumbar - Left Side Bend  12                  [facility] Adult PT Treatment/Exercise - 09/07/17 1727      Knee/Hip Exercises: Stretches   Other Knee/Hip Stretches  R QL stretch 2 x 30 sec      Knee/Hip Exercises: Standing   Hip ADduction  Right;2 sets;10 reps;Strengthening with green theraband      Knee/Hip Exercises: Sidelying   Hip ADduction  1 set;Both;2 sets;Strengthening      Modalities   Modalities  Ultrasound      Ultrasound   Ultrasound Location  R QL     Ultrasound Parameters  1.2 w/cm2, 1.omhz at 100%  x 8 min    Ultrasound Goals  Pain muscle spasm      Manual Therapy   Joint Mobilization  LAD grade 5    Soft tissue mobilization  IASTM over the R glute med and lumbar parapsinals             PT Education - 09/07/17 1733    Education provided  Yes    Education Details  benefits of [place], sleeping postioning and benefits of using pillow between knees to relieve tension of glute med on R    Person(s) Educated  Patient    Methods  Explanation;Verbal cues    Comprehension  Verbalized understanding;Verbal cues required       PT Short Term Goals - 08/31/17 0720      PT SHORT TERM GOAL #1   Title  pt to be I with inital HEP     Time  3    Period  Weeks    Status  Achieved      PT SHORT TERM GOAL #2   Title  pt to demo proper posture in sitting/ standing and walking as well as lifting mechanics to prevent and reduce low back and hip pain    Time  3    Period  Weeks    Status  Achieved        PT Long Term Goals - 08/07/17 6503      PT LONG TERM GOAL #1   Title  pt to  improve trunk flexion to >/= 80 degrees, and extension/ bil sidebending to >/= 20 degrees with </= 1/10 pain for functional mobility required for ADLs    Time  6    Period  Weeks    Status  New    Target Date  09/18/17      PT LONG TERM GOAL #2   Title  improve bil hip strength to >/= 4+/5 in all planes to provide hip stability and promote proper body mechanics with lifting activities     Time  6    Period  Weeks    Status  New    Target Date  09/18/17      PT LONG TERM GOAL #3   Title  pt to be able to sit/ stand and walk for >/= 60 min with </= 1/10 pain positional endurance required for job related tasks and pt's goal of returning to exercise.    Time  6    Period  Weeks    Status  New    Target Date  09/18/17      PT LONG TERM GOAL #4   Title  Increase FOTO score to </= 39% limited to demo improvement in function    Time  6    Period  Weeks    Status  New    Target Date  09/18/17      PT LONG TERM GOAL #5   Title  pt to be I with all HEP given as of last visit to maintain and progress her level of function independenlty    Time  6    Period  Weeks    Status  New    Target Date  09/18/17            Plan - 09/07/17 1729    Clinical Impression Statement  pt reports improvement of pain at 4/10 but continues to have tightness/ achiness inthe R posterior hip. She demosntrates improvement of FOTO score of 48% limited. utilized [place] for  R QL, and LAD mobs to correct potentional upslip in combination with adductor activation due possible inflare. end of session she reported no pain with standing and walking.     PT Next Visit Plan    review/ update HEP, possible inflare on R, glute med soft tissue work and stretching hip flexor/ adductor activation, core strengthening/ modalities prn. hip adduction during hip strengthening.     PT Home Exercise Plan  lower trunk rotation, ITB (glue med) stretch, adductor isometrics, hamstring stretching. piriformis IR/ER stretching supine verses  seated, POE, prone press ups, side lying QL stretch.     Consulted and Agree with Plan of Care  Patient       Patient will benefit from skilled therapeutic intervention in order to improve the following deficits and impairments:  Pain, Abnormal gait, Decreased endurance, Decreased balance, Decreased activity tolerance, Decreased strength, Decreased range of motion, Postural dysfunction, Improper body mechanics, Increased fascial restricitons  Visit Diagnosis: Chronic right-sided low back pain, with sciatica presence unspecified  Muscle weakness (generalized)     Problem List Patient Active Problem List   Diagnosis Date Noted  . Class 1 obesity without serious comorbidity with body mass index (BMI) of 31.0 to 31.9 in adult 10/21/2016  . Chronic constipation 10/21/2016  . Seasonal and perennial allergic rhinitis 10/05/2007  . Asthma, mild intermittent, well-controlled 10/05/2007   Starr Lake PT, DPT, LAT, ATC  09/07/17  5:34 PM      [place] [facility] [address] [place], [place], [address] Phone: [phone]   Fax:  [phone]  Name: Victoria [name] MRN: [id number] Date of Birth: December 31, 1960

## 2017-09-09 ENCOUNTER — Encounter: Payer: Self-pay | Admitting: Physical Therapy

## 2017-09-09 ENCOUNTER — Ambulatory Visit: Payer: 59 | Admitting: Physical Therapy

## 2017-09-09 DIAGNOSIS — R293 Abnormal posture: Secondary | ICD-10-CM

## 2017-09-09 DIAGNOSIS — M545 Low back pain: Secondary | ICD-10-CM | POA: Diagnosis not present

## 2017-09-09 DIAGNOSIS — G8929 Other chronic pain: Secondary | ICD-10-CM

## 2017-09-09 DIAGNOSIS — M6281 Muscle weakness (generalized): Secondary | ICD-10-CM

## 2017-09-09 NOTE — Therapy (Signed)
[place], [place], [address] Phone: [phone]   Fax:  [phone]  Physical Therapy Treatment  Patient Details  Name: Victoria [name] MRN: [id number] Date of Birth: 1961/01/09 Referring Provider: Dondra Prader NP   Encounter Date: 09/09/2017  PT End of Session - 09/09/17 1630    Visit Number  9    Number of Visits  13    Date for PT Re-Evaluation  09/18/17    PT Start Time  6283    PT Stop Time  1718    PT Time Calculation (min)  47 min    Activity Tolerance  Patient tolerated treatment well    Behavior During Therapy  [facility] for tasks assessed/performed       Past Medical History:  Diagnosis Date  . Abnormal Pap smear of cervix   . Allergic rhinitis   . Asthma   . GERD (gastroesophageal reflux disease)   . Headache(784.0)    tension- not an issue  . Hypertension     Past Surgical History:  Procedure Laterality Date  . BALLOON DILATION N/A 11/19/2015   Procedure: BALLOON DILATION;  Surgeon: Garlan Fair, MD;  Location: [facility];  Service: Endoscopy;  Laterality: N/A;  . BUNIONECTOMY Bilateral   . COLONOSCOPY WITH PROPOFOL N/A 10/05/2012   Procedure: COLONOSCOPY WITH PROPOFOL;  Surgeon: Garlan Fair, MD;  Location: [facility];  Service: Endoscopy;  Laterality: N/A;  . ESOPHAGOGASTRODUODENOSCOPY (EGD) WITH PROPOFOL N/A 11/19/2015   Procedure: ESOPHAGOGASTRODUODENOSCOPY (EGD) WITH PROPOFOL;  Surgeon: Garlan Fair, MD;  Location: [facility];  Service: Endoscopy;  Laterality: N/A;  . TUBAL LIGATION  27 years ago    There were no vitals filed for this visit.  Subjective Assessment - 09/09/17 1623    Subjective  "After the last visit I had some relief of pain that started back once I got home"     Currently in Pain?  Yes    Pain Score  6          [facility] PT Assessment - 09/09/17 1641      Special Tests    Special Tests  Leg LengthTest    Leg length test   True;Apparent       True   Right  94.5 in.    Left   96.1 in.                  [facility] Adult PT Treatment/Exercise - 09/09/17 1700      Self-Care   Self-Care  Other Self-Care Comments    Other Self-Care Comments   adding heel lift under RLE to promote equal pelvic heights and promote relief of pain and muscle tension.  trialed SI Belt with no improvement of symptomology      Lumbar Exercises: Stretches   Lower Trunk Rotation  -- 2 x 10 holding 2 sec    Quadruped Mid Back Stretch  2 reps;30 seconds 2 x 30 sec walking hands out to L for R QL stretch      Lumbar Exercises: Standing   Other Standing Lumbar Exercises  repeated standing extension 2 x 10 with hands on hips      Lumbar Exercises: Supine   Clam  -- 12 reps x 2 sets with green theraband    Bridge with [employer]  10 reps;1 second x 2 sets      Modalities   Modalities  Iontophoresis      Iontophoresis   Type of  Iontophoresis  Dexamethasone    Location  R QL    Dose  4mg /ml    Time  6 hour stat patch      Manual Therapy   Joint Mobilization  LAD grade 5    Soft tissue mobilization  IASTM over the R glute med              PT Education - 09/09/17 1720    Education provided  Yes    Education Details  findings regarding LLD assessment, benefits of heel lift to provide relief of pain. Iontophoresis benefits and length of wear.     Person(s) Educated  Patient    Methods  Explanation;Verbal cues;Handout    Comprehension  Verbalized understanding;Verbal cues required       PT Short Term Goals - 08/31/17 0720      PT SHORT TERM GOAL #1   Title  pt to be I with inital HEP     Time  3    Period  Weeks    Status  Achieved      PT SHORT TERM GOAL #2   Title  pt to demo proper posture in sitting/ standing and walking as well as lifting mechanics to prevent and reduce low back and hip pain    Time  3    Period  Weeks    Status  Achieved        PT Long Term Goals - 08/07/17 4818      PT LONG TERM GOAL #1    Title  pt to improve trunk flexion to >/= 80 degrees, and extension/ bil sidebending to >/= 20 degrees with </= 1/10 pain for functional mobility required for ADLs    Time  6    Period  Weeks    Status  New    Target Date  09/18/17      PT LONG TERM GOAL #2   Title  improve bil hip strength to >/= 4+/5 in all planes to provide hip stability and promote proper body mechanics with lifting activities     Time  6    Period  Weeks    Status  New    Target Date  09/18/17      PT LONG TERM GOAL #3   Title  pt to be able to sit/ stand and walk for >/= 60 min with </= 1/10 pain positional endurance required for job related tasks and pt's goal of returning to exercise.    Time  6    Period  Weeks    Status  New    Target Date  09/18/17      PT LONG TERM GOAL #4   Title  Increase FOTO score to </= 39% limited to demo improvement in function    Time  6    Period  Weeks    Status  New    Target Date  09/18/17      PT LONG TERM GOAL #5   Title  pt to be I with all HEP given as of last visit to maintain and progress her level of function independenlty    Time  6    Period  Weeks    Status  New    Target Date  09/18/17            Plan - 09/09/17 1724    Clinical Impression Statement  6/10 pain noted today. She continues to report relief of pain with adductor strengthening and reports soreness located  along the R glute medius/ QL. She continues to not tolerated manual techniques well reporting increased soreness. measuring LLD pt demonstrated LLE is long and provided heel lift for RLE. educated and applied ionto patch for R QL.     PT Next Visit Plan   update HEP, possible inflare on R, glute med soft tissue work and stretching hip flexor/ adductor activation, core strengthening/ modalities prn. hip adduction during hip strengthening.     PT Home Exercise Plan  lower trunk rotation, ITB (glue med) stretch, adductor isometrics, hamstring stretching. piriformis IR/ER stretching supine verses  seated, POE, prone press ups, side lying QL stretch. childs pose stretch     Consulted and Agree with Plan of Care  Patient       Patient will benefit from skilled therapeutic intervention in order to improve the following deficits and impairments:  Pain, Abnormal gait, Decreased endurance, Decreased balance, Decreased activity tolerance, Decreased strength, Decreased range of motion, Postural dysfunction, Improper body mechanics, Increased fascial restricitons  Visit Diagnosis: Chronic right-sided low back pain, with sciatica presence unspecified  Muscle weakness (generalized)  Abnormal posture     Problem List Patient Active Problem List   Diagnosis Date Noted  . Class 1 obesity without serious comorbidity with body mass index (BMI) of 31.0 to 31.9 in adult 10/21/2016  . Chronic constipation 10/21/2016  . Seasonal and perennial allergic rhinitis 10/05/2007  . Asthma, mild intermittent, well-controlled 10/05/2007   Starr Lake PT, DPT, LAT, ATC  09/09/17  5:28 PM      [place] [facility] [address] [place], [place], [address] Phone: [phone]   Fax:  [phone]  Name: Victoria [name] MRN: [id number] Date of Birth: 28-Jun-1961

## 2017-09-09 NOTE — Patient Instructions (Addendum)

## 2017-09-17 ENCOUNTER — Telehealth (INDEPENDENT_AMBULATORY_CARE_PROVIDER_SITE_OTHER): Payer: Self-pay | Admitting: Orthopedic Surgery

## 2017-09-17 NOTE — Telephone Encounter (Signed)
Patient called saying that the PT she has been doing hasn't helped with the pain and she was wondering what the next step should be, should she make another appointment or what?  CB # [phone]

## 2017-09-17 NOTE — Telephone Encounter (Signed)
Pt has not been in office since January please call and make appt.

## 2017-09-18 ENCOUNTER — Encounter: Payer: Self-pay | Admitting: Physical Therapy

## 2017-09-18 ENCOUNTER — Ambulatory Visit: Payer: 59 | Admitting: Physical Therapy

## 2017-09-18 DIAGNOSIS — M6281 Muscle weakness (generalized): Secondary | ICD-10-CM

## 2017-09-18 DIAGNOSIS — M545 Low back pain: Principal | ICD-10-CM

## 2017-09-18 DIAGNOSIS — R293 Abnormal posture: Secondary | ICD-10-CM

## 2017-09-18 DIAGNOSIS — G8929 Other chronic pain: Secondary | ICD-10-CM

## 2017-09-18 NOTE — Therapy (Addendum)
[place], [place], [address] Phone: [phone]   Fax:  [phone]  Physical Therapy Treatment /  Discharge Summary  Patient Details  Name: Victoria [name] MRN: [id number] Date of Birth: [birth year] Referring Provider: Dondra Prader NP   Encounter Date: 09/18/2017  PT End of Session - 09/18/17 0723    Visit Number  10    Number of Visits  13    Date for PT Re-Evaluation  09/18/17    PT Start Time  0715    PT Stop Time  0800    PT Time Calculation (min)  45 min       Past Medical History:  Diagnosis Date  . Abnormal Pap smear of cervix   . Allergic rhinitis   . Asthma   . GERD (gastroesophageal reflux disease)   . Headache(784.0)    tension- not an issue  . Hypertension     Past Surgical History:  Procedure Laterality Date  . BALLOON DILATION N/A 11/19/2015   Procedure: BALLOON DILATION;  Surgeon: Garlan Fair, MD;  Location: [facility];  Service: Endoscopy;  Laterality: N/A;  . BUNIONECTOMY Bilateral   . COLONOSCOPY WITH PROPOFOL N/A 10/05/2012   Procedure: COLONOSCOPY WITH PROPOFOL;  Surgeon: Garlan Fair, MD;  Location: [facility];  Service: Endoscopy;  Laterality: N/A;  . ESOPHAGOGASTRODUODENOSCOPY (EGD) WITH PROPOFOL N/A 11/19/2015   Procedure: ESOPHAGOGASTRODUODENOSCOPY (EGD) WITH PROPOFOL;  Surgeon: Garlan Fair, MD;  Location: [facility];  Service: Endoscopy;  Laterality: N/A;  . TUBAL LIGATION  27 years ago    There were no vitals filed for this visit.  Subjective Assessment - 09/18/17 0718    Subjective  The pain is really bad. It was terrible on Wednesday.     Currently in Pain?  Yes    Pain Score  6     Pain Location  Back    Pain Orientation  Right;Lower    Pain Descriptors / Indicators  Aching    Aggravating Factors   sitting, standing, walking     Pain Relieving Factors  OTC meds, heat, IFC          [facility] PT Assessment - 09/18/17 0001      AROM    Lumbar Flexion  60    Lumbar Extension  20    Lumbar - Right Side Bend  25    Lumbar - Left Side Bend  25      Strength   Right/Left Hip  Right;Left    Right Hip Flexion  4+/5    Right Hip Extension  4-/5    Right Hip ABduction  4+/5    Right Hip ADduction  4-/5    Left Hip Flexion  4+/5    Left Hip Extension  4-/5    Left Hip ABduction  4+/5    Left Hip ADduction  4-/5                  [facility] Adult PT Treatment/Exercise - 09/18/17 0001      Lumbar Exercises: Stretches   Lower Trunk Rotation  -- 2 x 10 holding 2 sec    Quadruped Mid Back Stretch  2 reps;30 seconds 2 x 30 sec walking hands out to L for R QL stretch    Piriformis Stretch  2 reps;20 seconds    Other Lumbar Stretch Exercise  Right QL stretch x 2 minutes sidelying over pillow     Other Lumbar Stretch Exercise  Cat/camel  Lumbar Exercises: Supine   Clam  20 reps green    Bridge with [employer]  10 reps;1 second x 2 sets    Bridge with clamshell  10 reps    Other Supine Lumbar Exercises  --      Knee/Hip Exercises: Aerobic   Nustep  L5 x 5 min UE/LE      Iontophoresis   Type of Iontophoresis  Dexamethasone    Location  R QL    Dose  58m/ml    Time  6 hour stat patch             PT Education - 09/18/17 0831    Education provided  Yes    Education Details  HEP    Person(s) Educated  Patient    Methods  Explanation;Handout    Comprehension  Verbalized understanding       PT Short Term Goals - 08/31/17 0720      PT SHORT TERM GOAL #1   Title  pt to be I with inital HEP     Time  3    Period  Weeks    Status  Achieved      PT SHORT TERM GOAL #2   Title  pt to demo proper posture in sitting/ standing and walking as well as lifting mechanics to prevent and reduce low back and hip pain    Time  3    Period  Weeks    Status  Achieved        PT Long Term Goals - 09/18/17 0737      PT LONG TERM GOAL #1   Title  pt to improve trunk flexion to >/= 80 degrees, and extension/  bil sidebending to >/= 20 degrees with </= 1/10 pain for functional mobility required for ADLs    Time  6    Period  Weeks    Status  On-going      PT LONG TERM GOAL #2   Title  improve bil hip strength to >/= 4+/5 in all planes to provide hip stability and promote proper body mechanics with lifting activities     Time  6    Period  Weeks    Status  On-going      PT LONG TERM GOAL #3   Title  pt to be able to sit/ stand and walk for >/= 60 min with </= 1/10 pain positional endurance required for job related tasks and pt's goal of returning to exercise.    Baseline  Sitting 20-30 minutes, stand/walk limited to 20-30 minutes     Time  6    Period  Weeks    Status  On-going      PT LONG TERM GOAL #4   Title  Increase FOTO score to </= 39% limited to demo improvement in function    Baseline  45% status     Time  6    Period  Weeks    Status  On-going      PT LONG TERM GOAL #5   Title  pt to be I with all HEP given as of last visit to maintain and progress her level of function independenlty    Time  6    Period  Weeks    Status  On-going            Plan - 09/18/17 08841   Clinical Impression Statement  Pt arrives reporting unrelenting pain. She has made an appointment for MD follow  up. This is her 10th visits and we decided to HOLD PT until after MD. Her AROM and MMT have improved. She is limited to 20-30 minutes in sitting, standing and walking. Her pain is Rt QL area and she denies radicular pain. We reviewed her HEP and added hip/core stability exercise. She will call to cancel her next appointment after seeing MD.     PT Next Visit Plan   ERO after MD appt? update HEP, possible inflare on R, glute med soft tissue work and stretching hip flexor/ adductor activation, core strengthening/ modalities prn. hip adduction during hip strengthening.     PT Home Exercise Plan  lower trunk rotation, ITB (glue med) stretch, adductor isometrics, hamstring stretching. piriformis IR/ER  stretching supine verses seated, POE, prone press ups, side lying QL stretch. childs pose stretch , bridge with clam green     Consulted and Agree with Plan of Care  Patient       Patient will benefit from skilled therapeutic intervention in order to improve the following deficits and impairments:  Pain, Abnormal gait, Decreased endurance, Decreased balance, Decreased activity tolerance, Decreased strength, Decreased range of motion, Postural dysfunction, Improper body mechanics, Increased fascial restricitons  Visit Diagnosis: Chronic right-sided low back pain, with sciatica presence unspecified  Muscle weakness (generalized)  Abnormal posture     Problem List Patient Active Problem List   Diagnosis Date Noted  . Class 1 obesity without serious comorbidity with body mass index (BMI) of 31.0 to 31.9 in adult 10/21/2016  . Chronic constipation 10/21/2016  . Seasonal and perennial allergic rhinitis 10/05/2007  . Asthma, mild intermittent, well-controlled 10/05/2007    Dorene Ar, PTA 09/18/2017, 8:46 AM  [place], [place], [address] Phone: [phone]   Fax:  [phone]  Name: Victoria [name] MRN: [id number] Date of Birth: 1960/12/27        PHYSICAL THERAPY DISCHARGE SUMMARY  Visits from Start of Care: 10  Current functional level related to goals / functional outcomes: See goals   Remaining deficits: unknown   Education / Equipment: HEP, posture  Plan: Patient agrees to discharge.  Patient goals were partially met. Patient is being discharged due to not returning since the last visit.  ?????         Kristoffer Leamon PT, DPT, LAT, ATC  10/09/17  10:53 AM

## 2017-09-22 ENCOUNTER — Encounter: Payer: 59 | Admitting: Physical Therapy

## 2017-09-24 ENCOUNTER — Ambulatory Visit (INDEPENDENT_AMBULATORY_CARE_PROVIDER_SITE_OTHER): Payer: 59 | Admitting: Orthopedic Surgery

## 2017-09-24 ENCOUNTER — Encounter (INDEPENDENT_AMBULATORY_CARE_PROVIDER_SITE_OTHER): Payer: Self-pay | Admitting: Orthopedic Surgery

## 2017-09-24 VITALS — Ht 64.0 in | Wt 190.0 lb

## 2017-09-24 DIAGNOSIS — M545 Low back pain: Secondary | ICD-10-CM | POA: Diagnosis not present

## 2017-09-24 NOTE — Progress Notes (Signed)
Office Visit Note   Patient: Victoria [name]           Date of Birth: 1961/02/05           MRN: [id number] Visit Date: 09/24/2017              Requested by: Lucretia Kern, DO [address] [place], [place] [address] PCP: Lucretia Kern, DO  Chief Complaint  Patient presents with  . Lower Back - Follow-up    s/p ESI with FN 07/11/17      HPI: Patient is a 57 year old woman who still has right-sided paraspinous muscle pain.  She states the pain is worse first thing in the morning and late at night.  She states she can only sleep in certain positions due to her back pain she states she has a constant pulling sensation.  She is tried formal physical therapy for 6 weeks without relief she is undergone an injection with Dr. Ernestina Patches without relief.  She is used anti-inflammatories without relief.  She has used heat and ice.  Assessment & Plan: Visit Diagnoses:  1. Acute right-sided low back pain, with sciatica presence unspecified     Plan: We will have patient follow-up with Dr. Merrilee Seashore good to see if there are any surgical options for her right-sided lumbar spine pain.  Follow-Up Instructions: Return in about 1 week (around 10/01/2017).   Ortho Exam  Patient is alert, oriented, no adenopathy, well-dressed, normal affect, normal respiratory effort. Examination she has a normal gait she has a negative straight leg raise bilaterally.  She has no pain with range of motion of the hip knee or ankle.  She has no focal motor weakness in the right lower extremity she has good hip flexion knee flexion extension plantar flexion dorsiflexion strength.  Imaging: No results found. No images are attached to the encounter.  Labs: Lab Results  Component Value Date   HGBA1C 6.4 10/21/2016   HGBA1C 6.3 05/15/2016   REPTSTATUS 08/29/2016 FINAL 08/28/2016   CULT MULTIPLE SPECIES PRESENT, SUGGEST RECOLLECTION (A) 08/28/2016   LABORGA  04/09/2016    Multiple organisms present,each less  than 10,000 CFU/mL. These organisms,commonly found on external and internal genitalia,are considered colonizers. No further testing performed.     @LABSALLVALUES (HGBA1)@  Body mass index is 32.61 kg/m.  Orders:  No orders of the defined types were placed in this encounter.  No orders of the defined types were placed in this encounter.    Procedures: No procedures performed  Clinical Data: No additional findings.  ROS:  All other systems negative, except as noted in the HPI. Review of Systems  Objective: Vital Signs: Ht 5\' 4"  (1.626 m)   Wt 190 lb (86.2 kg)   LMP 03/30/2012   BMI 32.61 kg/m   Specialty Comments:  No specialty comments available.  PMFS History: Patient Active Problem List   Diagnosis Date Noted  . Class 1 obesity without serious comorbidity with body mass index (BMI) of 31.0 to 31.9 in adult 10/21/2016  . Chronic constipation 10/21/2016  . Seasonal and perennial allergic rhinitis 10/05/2007  . Asthma, mild intermittent, well-controlled 10/05/2007   Past Medical History:  Diagnosis Date  . Abnormal Pap smear of cervix   . Allergic rhinitis   . Asthma   . GERD (gastroesophageal reflux disease)   . Headache(784.0)    tension- not an issue  . Hypertension     Family History  Problem Relation Age of Onset  . Emphysema Father   .  Lung cancer Father   . High Cholesterol Mother   . Osteoporosis Mother   . Hypertension Mother   . Migraines Daughter   . Hypertension Son        exercise  . Asthma Son   . Hypertension Sister   . Breast cancer Neg Hx     Past Surgical History:  Procedure Laterality Date  . BALLOON DILATION N/A 11/19/2015   Procedure: BALLOON DILATION;  Surgeon: Garlan Fair, MD;  Location: [facility];  Service: Endoscopy;  Laterality: N/A;  . BUNIONECTOMY Bilateral   . COLONOSCOPY WITH PROPOFOL N/A 10/05/2012   Procedure: COLONOSCOPY WITH PROPOFOL;  Surgeon: Garlan Fair, MD;  Location: [facility];  Service:  Endoscopy;  Laterality: N/A;  . ESOPHAGOGASTRODUODENOSCOPY (EGD) WITH PROPOFOL N/A 11/19/2015   Procedure: ESOPHAGOGASTRODUODENOSCOPY (EGD) WITH PROPOFOL;  Surgeon: Garlan Fair, MD;  Location: [facility];  Service: Endoscopy;  Laterality: N/A;  . TUBAL LIGATION  27 years ago   Social History   Occupational History  . Occupation: Biochemist, clinical,    Employer: [employer]  Tobacco Use  . Smoking status: Never Smoker  . Smokeless tobacco: Never Used  Substance and Sexual Activity  . Alcohol use: No  . Drug use: No  . Sexual activity: Yes    Partners: Male    Birth control/protection: Surgical, Post-menopausal    Comment: Tubal Ligation

## 2017-09-29 ENCOUNTER — Encounter: Payer: 59 | Admitting: Physical Therapy

## 2017-10-01 ENCOUNTER — Encounter: Payer: 59 | Admitting: Physical Therapy

## 2017-10-02 ENCOUNTER — Telehealth (INDEPENDENT_AMBULATORY_CARE_PROVIDER_SITE_OTHER): Payer: Self-pay | Admitting: Orthopedic Surgery

## 2017-10-02 NOTE — Telephone Encounter (Signed)
Patient called checking on status of request for records. I advised they were faxed 09/30/2017

## 2017-10-05 ENCOUNTER — Other Ambulatory Visit: Payer: Self-pay | Admitting: Allergy & Immunology

## 2017-10-05 DIAGNOSIS — M545 Low back pain, unspecified: Secondary | ICD-10-CM | POA: Insufficient documentation

## 2017-10-22 ENCOUNTER — Ambulatory Visit (INDEPENDENT_AMBULATORY_CARE_PROVIDER_SITE_OTHER): Payer: 59 | Admitting: Specialist

## 2017-10-22 ENCOUNTER — Ambulatory Visit (INDEPENDENT_AMBULATORY_CARE_PROVIDER_SITE_OTHER): Payer: 59

## 2017-10-22 ENCOUNTER — Encounter (INDEPENDENT_AMBULATORY_CARE_PROVIDER_SITE_OTHER): Payer: Self-pay | Admitting: Specialist

## 2017-10-22 VITALS — BP 132/87 | HR 49 | Ht 64.0 in | Wt 190.0 lb

## 2017-10-22 DIAGNOSIS — M545 Low back pain: Secondary | ICD-10-CM

## 2017-10-22 DIAGNOSIS — M4726 Other spondylosis with radiculopathy, lumbar region: Secondary | ICD-10-CM | POA: Diagnosis not present

## 2017-10-22 DIAGNOSIS — M4316 Spondylolisthesis, lumbar region: Secondary | ICD-10-CM | POA: Diagnosis not present

## 2017-10-22 MED ORDER — DICLOFENAC SODIUM 75 MG PO TBEC: DELAYED_RELEASE_TABLET | ORAL | 0 refills | Status: AC

## 2017-10-22 NOTE — Progress Notes (Signed)
Office Visit Note   Patient: Victoria [name]           Date of Birth: 1961-03-15           MRN: [id number] Visit Date: 10/22/2017              Requested by: Lucretia Kern, DO [place], [place] [address] PCP: Lucretia Kern, DO   Assessment & Plan: Visit Diagnoses:  1. Acute right-sided low back pain, with sciatica presence unspecified   2. Other spondylosis with radiculopathy, lumbar region   3. Spondylolisthesis, lumbar region     Plan: Avoid bending, stooping and avoid lifting weights greater than 10 lbs. Avoid prolong standing and walking. Avoid frequent bending and stooping  No lifting greater than 10 lbs. May use ice or moist heat for pain. Weight loss is of benefit. Exercises mainly flexion exercises, stationary bike or pool walking.  Follow-Up Instructions: Return in about 8 weeks (around 12/17/2017).   Orders:  Orders Placed This Encounter  Procedures  . XR Lumb Spine Flex&Ext Only   Meds ordered this encounter  Medications  . diclofenac (VOLTAREN) 75 MG EC tablet    Sig: Take 1 tablet (75 mg total) by mouth daily after breakfast for 7 days, THEN 1 tablet (75 mg total) 2 (two) times daily for 23 days.    Dispense:  53 tablet    Refill:  0      Procedures: No procedures performed   Clinical Data: No additional findings.   Subjective: Chief Complaint  Patient presents with  . Lower Back - Pain    57 year old female with history of right sided back pain, that started in November 2018. Lifting her sister out of a bath tub and feels like she may have lifted her wrong. It was with immediate pain and then started worse with changing positions and in standing and starting pain. A pulling sensation and achy pain. At first took prednisone and muscle relaxers and ibuprofen and the pain did not really improve. She had an ESI and that did not help. She went to PT at [facility] and had dry needling and strengthening without help.  Stopped PT one month ago. No bowel or bladder difficulty. No pain with cough or sneeze, has not really had to cough or sneeze.  There was pain with standing and walking and not so much with riding in a car, heated seats felt better. And she could only lie on the right side. The right side hurt and was painful. She had an MRI done in 06/2017. She tried to get over it for 2 weeks and then saw her Primary care MD Dr. Colin Benton. She tried creams, CBD. Can walk and grocery shop now, the pain is better the last 2 days. She was able to go until after 5 PM yesterday before taking the medication, take ibuprofen and arthritis strength tylenol and naprosyn. Also took a muscle relaxer for spasm.     Review of Systems  Constitutional: Negative.   HENT: Negative.   Eyes: Negative.   Respiratory: Negative.   Cardiovascular: Negative.   Gastrointestinal: Negative.   Endocrine: Negative.   Genitourinary: Negative.   Musculoskeletal: Negative.   Skin: Negative.   Allergic/Immunologic: Negative.   Neurological: Negative.   Hematological: Negative.   Psychiatric/Behavioral: Negative.      Objective: Vital Signs: BP 132/87 (BP Location: Left Arm, Patient Position: Sitting)   Pulse (!) 49   Ht  5\' 4"  (1.626 m)   Wt 190 lb (86.2 kg)   LMP 03/30/2012   BMI 32.61 kg/m   Physical Exam  Constitutional: She is oriented to person, place, and time. She appears well-developed and well-nourished.  HENT:  Head: Normocephalic and atraumatic.  Eyes: Pupils are equal, round, and reactive to light. EOM are normal.  Neck: Normal range of motion. Neck supple.  Pulmonary/Chest: Effort normal and breath sounds normal.  Abdominal: Soft. Bowel sounds are normal.  Neurological: She is alert and oriented to person, place, and time.  Skin: Skin is warm and dry.  Psychiatric: She has a normal mood and affect. Her behavior is normal. Judgment and thought content normal.    Back Exam   Tenderness  The patient is  experiencing tenderness in the lumbar.  Range of Motion  Extension: abnormal  Flexion: normal  Lateral bend right: abnormal  Lateral bend left: abnormal  Rotation right: abnormal  Rotation left: abnormal   Muscle Strength  Right Quadriceps:  5/5  Left Quadriceps:  5/5  Right Hamstrings:  5/5  Left Hamstrings:  5/5   Tests  Straight leg raise right: negative Straight leg raise left: negative  Reflexes  Patellar: normal Achilles: normal Biceps: normal Babinski's sign: normal   Other  Toe walk: normal Heel walk: normal Sensation: normal Gait: normal  Erythema: no back redness Scars: absent      Specialty Comments:  No specialty comments available.  Imaging: Xr Lumb Spine Flex&ext Only  Result Date: 10/22/2017 Lateral flexion and extension radiographs show grade one 1-53mm anterolisthesis L4-5 worse with flexion, minimal DDD narrowing L4-5. MRI from 06/2017 with facet effusions bilateral L4-5 and mild right L5-S1.    PMFS History: Patient Active Problem List   Diagnosis Date Noted  . Class 1 obesity without serious comorbidity with body mass index (BMI) of 31.0 to 31.9 in adult 10/21/2016  . Chronic constipation 10/21/2016  . Seasonal and perennial allergic rhinitis 10/05/2007  . Asthma, mild intermittent, well-controlled 10/05/2007   Past Medical History:  Diagnosis Date  . Abnormal Pap smear of cervix   . Allergic rhinitis   . Asthma   . GERD (gastroesophageal reflux disease)   . Headache(784.0)    tension- not an issue  . Hypertension     Family History  Problem Relation Age of Onset  . Emphysema Father   . Lung cancer Father   . High Cholesterol Mother   . Osteoporosis Mother   . Hypertension Mother   . Migraines Daughter   . Hypertension Son        exercise  . Asthma Son   . Hypertension Sister   . Breast cancer Neg Hx     Past Surgical History:  Procedure Laterality Date  . BALLOON DILATION N/A 11/19/2015   Procedure: BALLOON DILATION;   Surgeon: Garlan Fair, MD;  Location: [facility];  Service: Endoscopy;  Laterality: N/A;  . BUNIONECTOMY Bilateral   . COLONOSCOPY WITH PROPOFOL N/A 10/05/2012   Procedure: COLONOSCOPY WITH PROPOFOL;  Surgeon: Garlan Fair, MD;  Location: [facility];  Service: Endoscopy;  Laterality: N/A;  . ESOPHAGOGASTRODUODENOSCOPY (EGD) WITH PROPOFOL N/A 11/19/2015   Procedure: ESOPHAGOGASTRODUODENOSCOPY (EGD) WITH PROPOFOL;  Surgeon: Garlan Fair, MD;  Location: [facility];  Service: Endoscopy;  Laterality: N/A;  . TUBAL LIGATION  27 years ago   Social History   Occupational History  . Occupation: Biochemist, clinical,    Employer: [employer]  Tobacco Use  . Smoking status: Never Smoker  .  Smokeless tobacco: Never Used  Substance and Sexual Activity  . Alcohol use: No  . Drug use: No  . Sexual activity: Yes    Partners: Male    Birth control/protection: Surgical, Post-menopausal    Comment: Tubal Ligation

## 2017-10-22 NOTE — Patient Instructions (Addendum)
Avoid bending, stooping and avoid lifting weights greater than 10 lbs. Avoid prolong standing and walking. Avoid frequent bending and stooping  No lifting greater than 10 lbs. May use ice or moist heat for pain. Weight loss is of benefit. Exercises mainly flexion exercises, stationary bike or pool walking.

## 2017-11-12 ENCOUNTER — Encounter: Payer: Self-pay | Admitting: Allergy & Immunology

## 2017-11-12 ENCOUNTER — Ambulatory Visit (INDEPENDENT_AMBULATORY_CARE_PROVIDER_SITE_OTHER): Payer: 59 | Admitting: Allergy & Immunology

## 2017-11-12 VITALS — BP 122/82 | HR 76 | Resp 16

## 2017-11-12 DIAGNOSIS — J302 Other seasonal allergic rhinitis: Secondary | ICD-10-CM | POA: Diagnosis not present

## 2017-11-12 DIAGNOSIS — T7800XD Anaphylactic reaction due to unspecified food, subsequent encounter: Secondary | ICD-10-CM

## 2017-11-12 DIAGNOSIS — J4530 Mild persistent asthma, uncomplicated: Secondary | ICD-10-CM | POA: Diagnosis not present

## 2017-11-12 DIAGNOSIS — J984 Other disorders of lung: Secondary | ICD-10-CM

## 2017-11-12 DIAGNOSIS — J3089 Other allergic rhinitis: Secondary | ICD-10-CM

## 2017-11-12 DIAGNOSIS — T7800XA Anaphylactic reaction due to unspecified food, initial encounter: Secondary | ICD-10-CM | POA: Insufficient documentation

## 2017-11-12 MED ORDER — EPINEPHRINE 0.3 MG/0.3ML IJ SOAJ: INTRAMUSCULAR | 2 refills | Status: DC

## 2017-11-12 MED ORDER — MONTELUKAST SODIUM 10 MG PO TABS: 10.0000 mg | ORAL_TABLET | Freq: Every day | ORAL | 1 refills | Status: DC

## 2017-11-12 NOTE — Progress Notes (Signed)
FOLLOW UP  Date of Service/Encounter:  11/12/17   Assessment:   Mild persistent asthma, uncomplicated  Seasonal and perennial allergic rhinitis (grasses, weeds, trees, molds, dust mite, cat, and cockroach)  Anaphylactic shock due to food (shellfish)  Restrictive airway disease    Victoria [name] returns for regularly scheduled follow-up visit.  She continues to have an abnormal spirometry, but she remains surprisingly asymptomatic.  Her review of systems as always is negative.  She has had no prednisone burst, ER visits, and has no history of asthma symptoms since last visit.  Her albuterol inhalers typically expire before they are used.  She does remain on the montelukast, which has been stopped in the past with worsening symptoms.  We could consider doing a chest CT or full pulmonary function testing, but this seems to not be warranted given her lack of symptoms.   Plan/Recommendations:   1. Mild persistent asthma, uncomplicated - Lung testing looked similar to the last visit.  - Continue with Singulair 10mg  daily. - We will continue with Proventil 4 puffs every 4-6 hours as needed for coughing/wheezing.  2. Chronic allergic rhinitis (grasses, weeds, trees, molds, dust mite, cat, and cockroach) - Continue with a nasal steroid spray such as Flonase, up to 2 sprays per nostril once daily. - Continue with the use of an over-the-counter antihistamine as needed for breakthrough symptoms.   3. Adverse food reaction (shellfish)  - We will send in a new prescription for AuviQ. - Continue to avoid shellfish for now.  4. Return in about 1 year (around 11/13/2018).   Subjective:   Victoria [name] is a 57 y.o. female presenting today for follow up of  Chief Complaint  Patient presents with  . Asthma    follow up    [name] has a history of the following: Patient Active Problem List   Diagnosis Date Noted  . Mild persistent asthma, uncomplicated 63/87/5643    . Restrictive airway disease 11/12/2017  . Anaphylactic shock due to adverse food reaction 11/12/2017  . Chronic seasonal allergic rhinitis due to fungal spores 11/12/2017  . Class 1 obesity without serious comorbidity with body mass index (BMI) of 31.0 to 31.9 in adult 10/21/2016  . Chronic constipation 10/21/2016  . Seasonal and perennial allergic rhinitis 10/05/2007  . Asthma, mild intermittent, well-controlled 10/05/2007    History obtained from: chart review and patient.  Victoria [name]'s Primary Care Provider is Lucretia Kern, DO.     Victoria [name] is a 57 y.o. female presenting for a follow up visit.  She was last seen in July 2018.  At that time, her spirometry was completely stable.  We did discuss putting her on a daily controller medication, but since she was asymptomatic we continued her on Singulair 10 mg daily and albuterol as needed.  She has a history of perennial and seasonal allergic rhinitis with sensitizations to multiple environmental allergens.  Despite this, she is using fluticasone up to 2 sprays per nostril daily as needed as well as an over-the-counter antihistamine.  She has a history of anaphylaxis to shellfish and we recommended continued avoidance.  We refilled her Auvi-Q.  Since the last visit, she has done very well.  She does not remember the last time that she has used her rescue medication.  Asthma/Respiratory Symptom History: She has done well with the montelukast 10mg  daily. She has not been needing her albuterol at all. She did try to get off of the montelukast in the distant  past but she worsened with this. Victoria [name]'s asthma has been well controlled. She has not required rescue medication, experienced nocturnal awakenings due to lower respiratory symptoms, nor have activities of daily living been limited. She has required no Emergency Department or Urgent Care visits for her asthma. She has required zero courses of systemic steroids for asthma exacerbations since  the last visit. ACT score today is 25, indicating excellent asthma symptom control.   Allergic Rhinitis Symptom History: She has done well with the fluticasone nasal spray as needed. She has not needed it yet. She is on cetirizine as needed.   Food Allergy Symptom History: She continues to avoid shellfish.  She is not interested in retesting at this time.  She does need a new Auvi-Q prescription.  Otherwise, there have been no changes to her past medical history, surgical history, family history, or social history.    Review of Systems: a 14-point review of systems is pertinent for what is mentioned in HPI.  Otherwise, all other systems were negative. Constitutional: negative other than that listed in the HPI Eyes: negative other than that listed in the HPI Ears, nose, mouth, throat, and face: negative other than that listed in the HPI Respiratory: negative other than that listed in the HPI Cardiovascular: negative other than that listed in the HPI Gastrointestinal: negative other than that listed in the HPI Genitourinary: negative other than that listed in the HPI Integument: negative other than that listed in the HPI Hematologic: negative other than that listed in the HPI Musculoskeletal: negative other than that listed in the HPI Neurological: negative other than that listed in the HPI Allergy/Immunologic: negative other than that listed in the HPI    Objective:   Blood pressure 122/82, pulse 76, resp. rate 16, last menstrual period 03/30/2012. There is no height or weight on file to calculate BMI.   Physical Exam:  General: Alert, interactive, in no acute distress. Very pleasant female and talkative.  Eyes: No conjunctival injection bilaterally, no discharge on the right, no discharge on the left and no Horner-Trantas dots present. PERRL bilaterally. EOMI without pain. No photophobia.  Ears: Right TM pearly gray with normal light reflex, Left TM pearly gray with normal light  reflex, Right TM intact without perforation and Left TM intact without perforation.  Nose/Throat: External nose within normal limits and septum midline. Turbinates mildly edematous with clear discharge. Posterior oropharynx mildly erythematous without cobblestoning in the posterior oropharynx. Tonsils 2+ without exudates.  Tongue without thrush. Lungs: Clear to auscultation without wheezing, rhonchi or rales. No increased work of breathing. CV: Normal S1/S2. No murmurs. Capillary refill <2 seconds.  Skin: Warm and dry, without lesions or rashes. Neuro:   Grossly intact. No focal deficits appreciated. Responsive to questions.  Diagnostic studies:   Spirometry: results abnormal (FEV1: 1.06/46%, FVC: 1.61/56%, FEV1/FVC: 0.66%).    Spirometry consistent with mixed obstructive and restrictive disease. Overall numbers are stable compared to her previous spirometric findings.   Allergy Studies: none     Salvatore Marvel, MD  [facility]

## 2017-11-12 NOTE — Patient Instructions (Addendum)
1. Mild persistent asthma, uncomplicated - Lung testing looked similar to the last visit.  - Continue with Singulair 10mg  daily. - We will continue with Proventil 4 puffs every 4-6 hours as needed for coughing/wheezing.  2. Chronic allergic rhinitis (grasses, weeds, trees, molds, dust mite, cat, and cockroach) - Continue with a nasal steroid spray such as Flonase, up to 2 sprays per nostril once daily. - Continue with the use of an over-the-counter antihistamine as needed for breakthrough symptoms.   3. Adverse food reaction (shellfish)  - We will send in a new prescription for AuviQ. - Continue to avoid shellfish for now.  4. Return in about 1 year (around 11/13/2018).  Please inform us of any Emergency Department visits, hospitalizations, or changes in symptoms. Call us before going to the ED for breathing or allergy symptoms since we might be able to fit you in for a sick visit. Feel free to contact us anytime with any questions, problems, or concerns.  It was a pleasure to see you again today!   Websites that have reliable patient information: 1. American Academy of Asthma, Allergy, and Immunology: www.aaaai.org 2. Food Allergy Research and Education (FARE): foodallergy.org 3. Mothers of Asthmatics: http://www.asthmacommunitynetwork.org 4. American College of Allergy, Asthma, and Immunology: www.acaai.org

## 2017-12-07 ENCOUNTER — Ambulatory Visit (INDEPENDENT_AMBULATORY_CARE_PROVIDER_SITE_OTHER): Payer: 59 | Admitting: Family Medicine

## 2017-12-07 ENCOUNTER — Encounter: Payer: Self-pay | Admitting: Family Medicine

## 2017-12-07 VITALS — BP 110/76 | HR 74 | Temp 99.1°F | Wt 190.0 lb

## 2017-12-07 DIAGNOSIS — M546 Pain in thoracic spine: Secondary | ICD-10-CM | POA: Diagnosis not present

## 2017-12-07 DIAGNOSIS — S29012A Strain of muscle and tendon of back wall of thorax, initial encounter: Secondary | ICD-10-CM

## 2017-12-07 NOTE — Progress Notes (Signed)
Subjective:    Patient ID: Victoria [name], female    DOB: Feb 26, 1961, 57 y.o.   MRN: [id number]  No chief complaint on file.   HPI Patient was seen today for acute concern.  Pt endorses neck pain times a few weeks.  Pt notes burning at the base of her neck posteriorly.  Pt attributes her symptoms to her posture at work.  Pt has to reach for work position to reach the keyboard of her computer.  Pt has been taking ibuprofen 400 mg daily when the pain starts at work.  She is also tried using heating pad which eases the pain and salamopas.  Pt noticed an improvement in her symptoms today as it is her day off.  Pt states her job requires a doctor's note in order to make any ergonomic modifications.  Of note pt endorse ongoing h/o back pain, which has since improved.   Past Medical History:  Diagnosis Date  . Abnormal Pap smear of cervix   . Allergic rhinitis   . Asthma   . GERD (gastroesophageal reflux disease)   . Headache(784.0)    tension- not an issue  . Hypertension     Allergies  Allergen Reactions  . Shellfish Allergy Shortness Of Breath and Itching  . Sulfonamide Derivatives Itching, Swelling and Other (See Comments)    Throat swells  . Nasonex [Mometasone Furoate] Other (See Comments)    Nose bleeds    ROS General: Denies fever, chills, night sweats, changes in weight, changes in appetite HEENT: Denies headaches, ear pain, changes in vision, rhinorrhea, sore throat CV: Denies CP, palpitations, SOB, orthopnea Pulm: Denies SOB, cough, wheezing GI: Denies abdominal pain, nausea, vomiting, diarrhea, constipation GU: Denies dysuria, hematuria, frequency, vaginal discharge Msk: Denies muscle cramps, joint pains  +neck pain.  Neck pain with moving into similar position used for typing at work. Neuro: Denies weakness, numbness, tingling Skin: Denies rashes, bruising Psych: Denies depression, anxiety, hallucinations     Objective:    Blood pressure 110/76, pulse 74,  temperature 99.1 F (37.3 C), temperature source Oral, weight 190 lb (86.2 kg), last menstrual period 03/30/2012, SpO2 98 %.   Gen. Pleasant, well-nourished, in no distress, normal affect   HEENT: [place]/AT, face symmetric,  no scleral icterus, PERRLA, nares patent without drainage Lungs: no accessory muscle use, CTAB, no wheezes or rales Cardiovascular: RRR, no m/r/g, no peripheral edema Musculoskeletal: No deformities, no cyanosis or clubbing, normal tone.  TTP of trapezius at shoulders and neck b/l.  TTP of low cervical/upper thoracic spine.  No trigger points noted.  FROM of neck and UEs b/l.  No weakness in UEs b/l.  No neck pain with axial loading. Neuro:  A&Ox3, CN II-XII intact, normal gait   Wt Readings from Last 3 Encounters:  12/07/17 190 lb (86.2 kg)  10/22/17 190 lb (86.2 kg)  09/24/17 190 lb (86.2 kg)    Lab Results  Component Value Date   WBC 5.2 01/12/2017   HGB 13.3 03/20/2017   HCT 39.0 03/20/2017   PLT 226.0 01/12/2017   GLUCOSE 103 (H) 03/20/2017   CHOL 267 (H) 05/15/2016   HDL 141.10 05/15/2016   ALT 14 09/20/2015   AST 23 09/20/2015   NA 140 03/20/2017   K 4.0 03/20/2017   CL 102 03/20/2017   CREATININE 0.80 03/20/2017   BUN 15 03/20/2017   CO2 32 01/12/2017   TSH 0.94 05/15/2016   HGBA1C 6.4 10/21/2016    Assessment/Plan:  Acute midline thoracic back pain -  likely 2/2 posture.  Muscle strain of upper back  -discussed proper body mechanics and ergonomics -discussed reducing stress and stretching -continue heat, massage, use NSAIDs sparingly with food.  Consider biofreeze. -given handouts and a note for work.  F/u prn  Grier Mitts, MD

## 2017-12-07 NOTE — Patient Instructions (Addendum)
You can try Biofreeze, massage, heat for your neck pain.  Working on your posture and reducing stress will also help. Muscle Strain A muscle strain is an injury that occurs when a muscle is stretched beyond its normal length. Usually a small number of muscle fibers are torn when this happens. Muscle strain is rated in degrees. First-degree strains have the least amount of muscle fiber tearing and pain. Second-degree and third-degree strains have increasingly more tearing and pain. Usually, recovery from muscle strain takes 1-2 weeks. Complete healing takes 5-6 weeks. What are the causes? Muscle strain happens when a sudden, violent force placed on a muscle stretches it too far. This may occur with lifting, sports, or a fall. What increases the risk? Muscle strain is especially common in athletes. What are the signs or symptoms? At the site of the muscle strain, there may be:  Pain.  Bruising.  Swelling.  Difficulty using the muscle due to pain or lack of normal function.  How is this diagnosed? Your health care provider will perform a physical exam and ask about your medical history. How is this treated? Often, the best treatment for a muscle strain is resting, icing, and applying cold compresses to the injured area. Follow these instructions at home:  Use the PRICE method of treatment to promote muscle healing during the first 2-3 days after your injury. The PRICE method involves: ? Protecting the muscle from being injured again. ? Restricting your activity and resting the injured body part. ? Icing your injury. To do this, put ice in a plastic bag. Place a towel between your skin and the bag. Then, apply the ice and leave it on from 15-20 minutes each hour. After the third day, switch to moist heat packs. ? Apply compression to the injured area with a splint or elastic bandage. Be careful not to wrap it too tightly. This may interfere with blood circulation or increase  swelling. ? Elevate the injured body part above the level of your heart as often as you can.  Only take over-the-counter or prescription medicines for pain, discomfort, or fever as directed by your health care provider.  Warming up prior to exercise helps to prevent future muscle strains. Contact a health care provider if:  You have increasing pain or swelling in the injured area.  You have numbness, tingling, or a significant loss of strength in the injured area. This information is not intended to replace advice given to you by your health care provider. Make sure you discuss any questions you have with your health care provider. Document Released: 06/16/2005 Document Revised: 11/22/2015 Document Reviewed: 01/13/2013 Elsevier Interactive Patient Education  2017 [place].  Back Exercises If you have pain in your back, do these exercises 2-3 times each day or as told by your doctor. When the pain goes away, do the exercises once each day, but repeat the steps more times for each exercise (do more repetitions). If you do not have pain in your back, do these exercises once each day or as told by your doctor. Exercises Single Knee to Chest  Do these steps 3-5 times in a row for each leg: 1. Lie on your back on a firm bed or the floor with your legs stretched out. 2. Bring one knee to your chest. 3. Hold your knee to your chest by grabbing your knee or thigh. 4. Pull on your knee until you feel a gentle stretch in your lower back. 5. Keep doing the stretch for  10-30 seconds. 6. Slowly let go of your leg and straighten it.  Pelvic Tilt  Do these steps 5-10 times in a row: 1. Lie on your back on a firm bed or the floor with your legs stretched out. 2. Bend your knees so they point up to the ceiling. Your feet should be flat on the floor. 3. Tighten your lower belly (abdomen) muscles to press your lower back against the floor. This will make your tailbone point up to the ceiling  instead of pointing down to your feet or the floor. 4. Stay in this position for 5-10 seconds while you gently tighten your muscles and breathe evenly.  Cat-Cow  Do these steps until your lower back bends more easily: 1. Get on your hands and knees on a firm surface. Keep your hands under your shoulders, and keep your knees under your hips. You may put padding under your knees. 2. Let your head hang down, and make your tailbone point down to the floor so your lower back is round like the back of a cat. 3. Stay in this position for 5 seconds. 4. Slowly lift your head and make your tailbone point up to the ceiling so your back hangs low (sags) like the back of a cow. 5. Stay in this position for 5 seconds.  Press-Ups  Do these steps 5-10 times in a row: 1. Lie on your belly (face-down) on the floor. 2. Place your hands near your head, about shoulder-width apart. 3. While you keep your back relaxed and keep your hips on the floor, slowly straighten your arms to raise the top half of your body and lift your shoulders. Do not use your back muscles. To make yourself more comfortable, you may change where you place your hands. 4. Stay in this position for 5 seconds. 5. Slowly return to lying flat on the floor.  Bridges  Do these steps 10 times in a row: 1. Lie on your back on a firm surface. 2. Bend your knees so they point up to the ceiling. Your feet should be flat on the floor. 3. Tighten your butt muscles and lift your butt off of the floor until your waist is almost as high as your knees. If you do not feel the muscles working in your butt and the back of your thighs, slide your feet 1-2 inches farther away from your butt. 4. Stay in this position for 3-5 seconds. 5. Slowly lower your butt to the floor, and let your butt muscles relax.  If this exercise is too easy, try doing it with your arms crossed over your chest. Belly Crunches  Do these steps 5-10 times in a row: 1. Lie on your  back on a firm bed or the floor with your legs stretched out. 2. Bend your knees so they point up to the ceiling. Your feet should be flat on the floor. 3. Cross your arms over your chest. 4. Tip your chin a little bit toward your chest but do not bend your neck. 5. Tighten your belly muscles and slowly raise your chest just enough to lift your shoulder blades a tiny bit off of the floor. 6. Slowly lower your chest and your head to the floor.  Back Lifts Do these steps 5-10 times in a row: 1. Lie on your belly (face-down) with your arms at your sides, and rest your forehead on the floor. 2. Tighten the muscles in your legs and your butt. 3. Slowly lift your  chest off of the floor while you keep your hips on the floor. Keep the back of your head in line with the curve in your back. Look at the floor while you do this. 4. Stay in this position for 3-5 seconds. 5. Slowly lower your chest and your face to the floor.  Contact a doctor if:  Your back pain gets a lot worse when you do an exercise.  Your back pain does not lessen 2 hours after you exercise. If you have any of these problems, stop doing the exercises. Do not do them again unless your doctor says it is okay. Get help right away if:  You have sudden, very bad back pain. If this happens, stop doing the exercises. Do not do them again unless your doctor says it is okay. This information is not intended to replace advice given to you by your health care provider. Make sure you discuss any questions you have with your health care provider. Document Released: 07/19/2010 Document Revised: 11/22/2015 Document Reviewed: 08/10/2014 Elsevier Interactive Patient Education  [employer].

## 2018-01-03 ENCOUNTER — Other Ambulatory Visit: Payer: Self-pay | Admitting: Family Medicine

## 2018-04-26 ENCOUNTER — Other Ambulatory Visit: Payer: Self-pay

## 2018-04-26 ENCOUNTER — Ambulatory Visit: Payer: 59 | Admitting: Obstetrics and Gynecology

## 2018-04-26 ENCOUNTER — Encounter: Payer: Self-pay | Admitting: Obstetrics and Gynecology

## 2018-04-26 ENCOUNTER — Ambulatory Visit (INDEPENDENT_AMBULATORY_CARE_PROVIDER_SITE_OTHER): Payer: 59 | Admitting: Obstetrics and Gynecology

## 2018-04-26 VITALS — BP 126/82 | HR 70 | Resp 18 | Ht 64.0 in | Wt 194.0 lb

## 2018-04-26 DIAGNOSIS — Z6833 Body mass index (BMI) 33.0-33.9, adult: Secondary | ICD-10-CM | POA: Diagnosis not present

## 2018-04-26 DIAGNOSIS — Z01419 Encounter for gynecological examination (general) (routine) without abnormal findings: Secondary | ICD-10-CM

## 2018-04-26 NOTE — Patient Instructions (Signed)

## 2018-04-26 NOTE — Progress Notes (Signed)
57 y.o. G38P2002 Married [place] American female here for annual exam.    Back injury from lifting.  Did PT and dry needling.  Also did epidural.   Thinking about going to see a nutritionist.   Having skin hives.  Taking Benadryl prn.  Saw allergest already.   PCP: Colin Benton, DO     Patient's last menstrual period was 03/30/2012.           Sexually active: Yes.    The current method of family planning is post menopausal status.    Exercising: No.   Smoker:  no  Health Maintenance: Pap:  04-16-17 Neg:Neg HR HPV, 12-12-13 Neg:Neg HR HPV History of abnormal Pap:  Yes, Hx LGSIL in 2010. MMG: 06-01-17 Neg/density B/BiRads1 Colonoscopy: 08/2012 normal BMD:   n/a  Result  n/a TDaP:  2011 Gardasil:   no EVO:JJKKX Hep C: 10-21-16 Neg Screening Labs:  --- Flu vaccine - done one month ago.    reports that she has never smoked. She has never used smokeless tobacco. She reports that she does not drink alcohol or use drugs.  Past Medical History:  Diagnosis Date  . Abnormal Pap smear of cervix   . Allergic rhinitis   . 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 Asthma   . GERD (gastroesophageal reflux disease)   . Headache(784.0)    tension- not an issue  . Hypertension     Past Surgical History:  Procedure Laterality Date  . BALLOON DILATION N/A 11/19/2015   Procedure: BALLOON DILATION;  Surgeon: Garlan Fair, MD;  Location: [facility];  Service: Endoscopy;  Laterality: N/A;  . BUNIONECTOMY Bilateral   . COLONOSCOPY WITH PROPOFOL N/A 10/05/2012   Procedure: COLONOSCOPY WITH PROPOFOL;  Surgeon: Garlan Fair, MD;  Location: [facility];  Service: Endoscopy;  Laterality: N/A;  . ESOPHAGOGASTRODUODENOSCOPY (EGD) WITH PROPOFOL N/A 11/19/2015   Procedure: ESOPHAGOGASTRODUODENOSCOPY (EGD) WITH PROPOFOL;  Surgeon: Garlan Fair, MD;  Location: [facility];  Service: Endoscopy;  Laterality: N/A;  . TUBAL LIGATION  27 years ago    Current Outpatient Medications  Medication Sig  Dispense Refill  . albuterol (PROVENTIL HFA) 108 (90 Base) MCG/ACT inhaler Inhale two puffs every 4-6 hours if needed for cough or wheeze 1 Inhaler 1  . azelastine (ASTELIN) 0.1 % nasal spray Place 2 sprays into both nostrils 2 (two) times daily. Use in each nostril as directed 30 mL 12  . cetirizine (ZYRTEC) 10 MG tablet Take 10 mg by mouth as needed for allergies.     . diphenhydrAMINE (BENADRYL) 25 MG tablet Take 25 mg by mouth at bedtime as needed for allergies or sleep.     Marland Kitchen docusate sodium (STOOL SOFTENER) 100 MG capsule Take 100 mg by mouth daily.    Marland Kitchen EPINEPHrine (AUVI-Q) 0.3 mg/0.3 mL IJ SOAJ injection Use as directed for severe allergic reaction 2 Device 2  . fluticasone (FLONASE) 50 MCG/ACT nasal spray Place 2 sprays into both nostrils daily as needed for allergies. 16 g 5  . hydrochlorothiazide (HYDRODIURIL) 25 MG tablet TAKE 1 TABLET DAILY 90 tablet 1  . montelukast (SINGULAIR) 10 MG tablet Take 1 tablet (10 mg total) by mouth at bedtime. 90 tablet 1  . polyethylene glycol (MIRALAX / GLYCOLAX) packet Take 17 g by mouth daily.    . ranitidine (ZANTAC) 150 MG tablet Take 150 mg by mouth as needed for heartburn.     No current facility-administered medications for this visit.     Family History  Problem Relation Age of Onset  . Emphysema Father   . Lung cancer Father   . High Cholesterol Mother   . Osteoporosis Mother   . Hypertension Mother   . Migraines Daughter   . Hypertension Son        exercise  . Asthma Son   . Hypertension Sister   . Breast cancer Neg Hx     Review of Systems  Constitutional: Positive for unexpected weight change (weight gain).  Skin:       Rash and itching on hands and scalp  All other systems reviewed and are negative.   Exam:   BP 126/82 (BP Location: Right Arm, Patient Position: Sitting, Cuff Size: Large)   Pulse 70   Resp 18   Ht 5\' 4"  (1.626 m)   Wt 194 lb (88 kg)   LMP 03/30/2012   BMI 33.30 kg/m     General appearance: alert,  cooperative and appears stated age Head: Normocephalic, without obvious abnormality, atraumatic Neck: no adenopathy, supple, symmetrical, trachea midline and thyroid normal to inspection and palpation Lungs: clear to auscultation bilaterally Breasts: normal appearance, no masses or tenderness, No nipple retraction or dimpling, No nipple discharge or bleeding, No axillary or supraclavicular adenopathy Heart: regular rate and rhythm Abdomen: soft, non-tender; no masses, no organomegaly Extremities: extremities normal, atraumatic, no cyanosis or edema Skin: Skin color, texture, turgor normal. No rashes or lesions Lymph nodes: Cervical, supraclavicular, and axillary nodes normal. No abnormal inguinal nodes palpated Neurologic: Grossly normal  Pelvic: External genitalia:  no lesions              Urethra:  normal appearing urethra with no masses, tenderness or lesions              Bartholins and Skenes: normal                 Vagina: normal appearing vagina with normal color and discharge, no lesions              Cervix: no lesions              Pap taken: No. Bimanual Exam:  Uterus:  normal size, contour, position, consistency, mobility, non-tender              Adnexa: no mass, fullness, tenderness              Rectal exam: Yes.  .  Confirms.              Anus:  normal sphincter tone, no lesions  Chaperone was present for exam.  Assessment:   Well woman visit with normal exam. Hx LGSIL. Hx elevated hemoglobin A1C in prediabetic range.   Plan: Mammogram screening. Recommended self breast awareness. Pap and HR HPV as above. Guidelines for Calcium, Vitamin D, regular exercise program including cardiovascular and weight bearing exercise. Routine labs.  Referral to nutritional counseling.  Follow up annually and prn.   After visit summary provided.

## 2018-04-27 LAB — COMPREHENSIVE METABOLIC PANEL
A/G RATIO: 2 (ref 1.2–2.2)
ALBUMIN: 4.7 g/dL (ref 3.5–5.5)
ALT: 20 IU/L (ref 0–32)
AST: 22 IU/L (ref 0–40)
Alkaline Phosphatase: 49 IU/L (ref 39–117)
BILIRUBIN TOTAL: 0.3 mg/dL (ref 0.0–1.2)
BUN / CREAT RATIO: 12 (ref 9–23)
BUN: 8 mg/dL (ref 6–24)
CALCIUM: 9.4 mg/dL (ref 8.7–10.2)
CO2: 24 mmol/L (ref 20–29)
Chloride: 96 mmol/L (ref 96–106)
Creatinine, Ser: 0.66 mg/dL (ref 0.57–1.00)
GFR calc Af Amer: 115 mL/min/{1.73_m2} (ref >59)
GFR, EST NON AFRICAN AMERICAN: 100 mL/min/{1.73_m2} (ref >59)
GLOBULIN, TOTAL: 2.4 g/dL (ref 1.5–4.5)
Glucose: 81 mg/dL (ref 65–99)
POTASSIUM: 3.4 mmol/L — AB (ref 3.5–5.2)
Sodium: 142 mmol/L (ref 134–144)
TOTAL PROTEIN: 7.1 g/dL (ref 6.0–8.5)

## 2018-04-27 LAB — LIPID PANEL
CHOLESTEROL TOTAL: 271 mg/dL — AB (ref 100–199)
Chol/HDL Ratio: 1.9 ratio (ref 0.0–4.4)
HDL: 144 mg/dL (ref >39)
LDL Calculated: 104 mg/dL — ABNORMAL HIGH (ref 0–99)
TRIGLYCERIDES: 117 mg/dL (ref 0–149)
VLDL Cholesterol Cal: 23 mg/dL (ref 5–40)

## 2018-04-27 LAB — HEMOGLOBIN A1C
Est. average glucose Bld gHb Est-mCnc: 131 mg/dL
Hgb A1c MFr Bld: 6.2 % — ABNORMAL HIGH (ref 4.8–5.6)

## 2018-04-27 LAB — CBC
HEMATOCRIT: 39 % (ref 34.0–46.6)
HEMOGLOBIN: 12.8 g/dL (ref 11.1–15.9)
MCH: 27.8 pg (ref 26.6–33.0)
MCHC: 32.8 g/dL (ref 31.5–35.7)
MCV: 85 fL (ref 79–97)
PLATELETS: 271 10*3/uL (ref 150–450)
RBC: 4.61 x10E6/uL (ref 3.77–5.28)
RDW: 14.4 % (ref 12.3–15.4)
WBC: 5 10*3/uL (ref 3.4–10.8)

## 2018-04-27 LAB — TSH: TSH: 1.74 u[IU]/mL (ref 0.450–4.500)

## 2018-05-11 ENCOUNTER — Other Ambulatory Visit: Payer: Self-pay | Admitting: Allergy & Immunology

## 2018-05-17 ENCOUNTER — Encounter: Payer: Self-pay | Admitting: Registered"

## 2018-05-17 ENCOUNTER — Encounter: Payer: 59 | Attending: Family Medicine | Admitting: Registered"

## 2018-05-17 DIAGNOSIS — E669 Obesity, unspecified: Secondary | ICD-10-CM | POA: Diagnosis present

## 2018-05-17 NOTE — Progress Notes (Signed)
  Medical Nutrition Therapy:  Appt start time: 4:30 end time:  5:05.   Assessment:  Primary concerns today: Pt states she is prediabetic and wants to work on her eating habits. Recent A1c 6.2 (04/26/2018). Pt states it has consistently been in the same range in recent years. Pt states she does not want to develop diabetes.   Pt expectations: knowing what she should not be eating  Pt states she works M-F 7 am-4 pm. Pt states she wants to start cooking dinner at night. Pt states her kitchen was recently remodeled causing her to eat out nightly. Pt states kitchen is not functional and able to be used for cooking. Pt states she hurt her back recently and it is much better now. Pt states she has a treadmill at home.   Pt states she is lactose intolerant and eating salads cause gas for her. Pt states she feels bloated often. Pt states she wants to lose weight.    Preferred Learning Style:   No preference indicated   Learning Readiness:   Ready  Change in progress   MEDICATIONS: See list   DIETARY INTAKE:  Usual eating pattern includes 3 meals and 1 snacks per day.  Everyday foods include popcorn, biscuit, pop tarts, fast food, chicken, fries, hot dogs, soup, soda, juice.  Avoided foods include red meat.    24-hr recall:  B ( AM): pop tarts + coffee or chicken + cheese biscuit Snk ( AM): none  L ( PM): nuggets + fries + soda or soup Snk ( PM): sometimes popcorn D ( PM): 2 hot dogs or salad + deli [place] Snk ( PM): popcorn Beverages: coffee (creamer only), water, OJ, soda  Usual physical activity: none stated  Estimated energy needs: 1600-1800 calories 180-200 g carbohydrates 120-135 g protein 44-50 g fat  Progress Towards Goal(s):  In progress.   Nutritional Diagnosis:  NB-1.1 Food and nutrition-related knowledge deficit As related to prediabetes.  As evidenced by pt verablizes incomplete knowledge.    Intervention:  Nutrition education and counseling. Pt was educated  and counseled on prediabetes, My Plate, fiber, A1c, and the importance of physical activity. Pt was in agreement with goals listed.  Goals: - Aim to increase fiber intake by adding vegetables to lunch and dinner.  - Snack options include protein + carbohydrates such as fruit + peanut butter, greek yogurt, cottage cheese + fruit, side salad, etc.  - Try lactose-free milk.  - Goal for physical activity is at least 30 min/day 5 days/week. Try to start with at least 15 minutes/day 2-3 days/week and build from there.   Teaching Method Utilized:  Visual Auditory Hands on  Handouts given during visit include:  My Plate  Barriers to learning/adherence to lifestyle change: contemplative stage of change  Demonstrated degree of understanding via:  Teach Back   Monitoring/Evaluation:  Dietary intake, exercise, and body weight prn.

## 2018-05-17 NOTE — Patient Instructions (Addendum)
-   Aim to increase fiber intake by adding vegetables to lunch and dinner.   - Snack options include protein + carbohydrates such as fruit + peanut butter, greek yogurt, cottage cheese + fruit, side salad, etc.   - Try lactose-free milk.   - Goal for physical activity is at least 30 min/day 5 days/week. Try to start with at least 15 minutes/day 2-3 days/week and build from there.

## 2018-05-18 ENCOUNTER — Ambulatory Visit: Payer: 59 | Admitting: Registered"

## 2018-05-18 ENCOUNTER — Other Ambulatory Visit: Payer: Self-pay | Admitting: Family Medicine

## 2018-05-18 DIAGNOSIS — Z1231 Encounter for screening mammogram for malignant neoplasm of breast: Secondary | ICD-10-CM

## 2018-05-19 ENCOUNTER — Encounter

## 2018-05-21 IMAGING — CT CT ANGIO HEAD
2 of 12 series · 6 of 47 positions shown · IV contrast (Omni 300)
Comparison: None.

CLINICAL DATA: Headache, acute, severe, worst of life.

EXAM:
CT ANGIOGRAPHY HEAD
TECHNIQUE: Multidetector CT imaging of the head was performed using the
standard protocol during bolus administration of intravenous
contrast. Multiplanar CT image reconstructions and MIPs were
obtained to evaluate the vascular anatomy.
CONTRAST:  50 cc Isovue 370 intravenous

[Series 5: cow 2.0 · axial · 0.42mm/px · z∈[-154,-52]mm · 3 of 103 slices shown]
[im 26/103  brain]
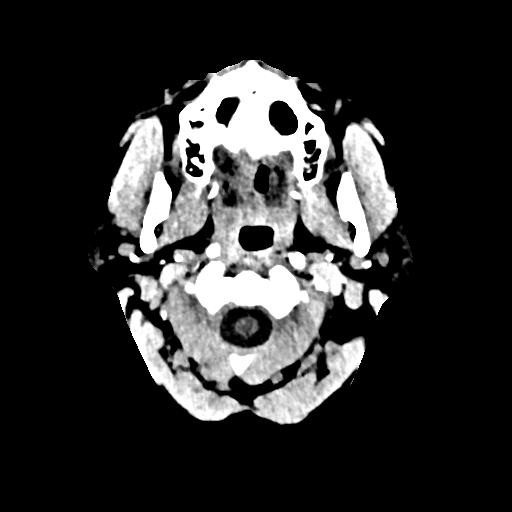
[im 52/103  bone]
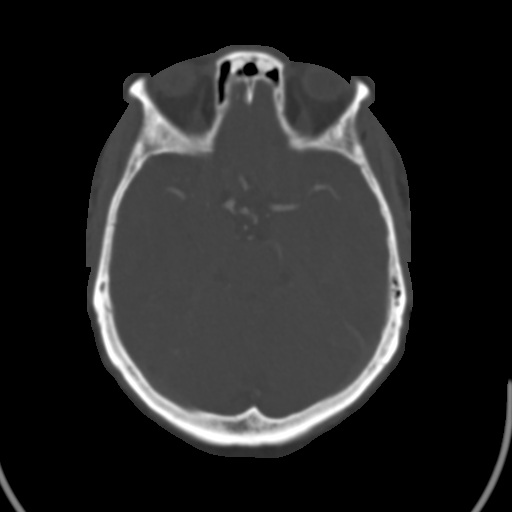
[im 77/103  brain]
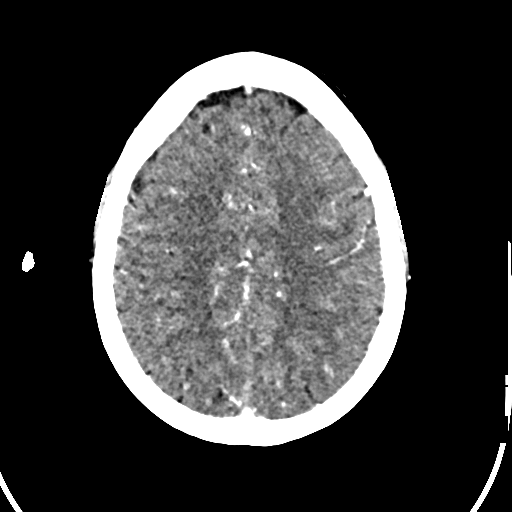

[Series 15: head bone · axial · 0.42mm/px · z∈[-97,-21]mm · 3 of 78 slices shown]
[im 20/78  bone]
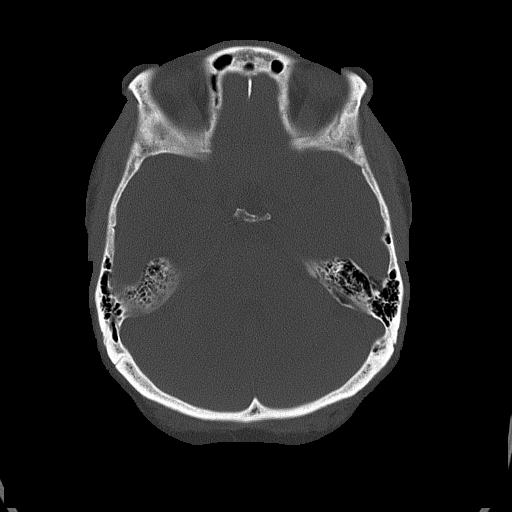
[im 39/78  bone]
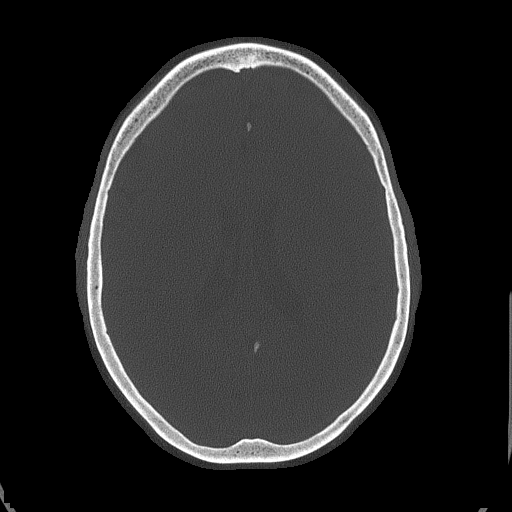
[im 58/78  bone]
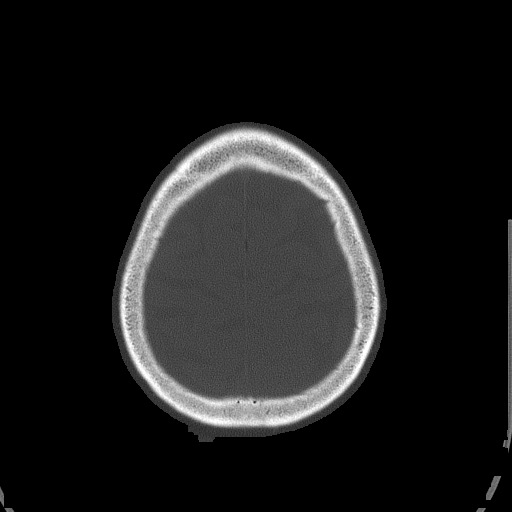

[6 of 47 positions shown; findings below may reference images not displayed]

FINDINGS: CT HEAD

Brain: No evidence of infarction, hemorrhage, hydrocephalus,
extra-axial collection or mass lesion/mass effect.

Vascular: See below

Skull: 16 x 4 mm subgaleal lipoma along the left forehead. No
osseous abnormality.

Sinuses: Negative

Orbits: Negative

CTA HEAD

Anterior circulation: Both distal cervical ICAs are beaded,
consistent with fibromuscular dysplasia. No intracranial stenosis,
beading, or aneurysm. No intracranial atheromatous changes.

Posterior circulation: Mild right vertebral artery dominance. Small
basilar in the setting of bilateral P1 segment hypoplasia. Vessels
are smooth and diffusely patent. Negative for aneurysm.

Venous sinuses: Patent

Anatomic variants: Bilateral fetal type PCA

Delayed phase: No abnormal intracranial enhancement.
IMPRESSION: 1. No acute intracranial finding.  Negative for cerebral aneurysm.
2. Fibromuscular dysplasia of the bilateral cervical ICA.

## 2018-05-31 ENCOUNTER — Ambulatory Visit (INDEPENDENT_AMBULATORY_CARE_PROVIDER_SITE_OTHER): Payer: 59 | Admitting: Allergy

## 2018-05-31 ENCOUNTER — Encounter: Payer: Self-pay | Admitting: Allergy

## 2018-05-31 VITALS — BP 132/90 | HR 66 | Temp 99.1°F | Resp 20 | Ht 64.0 in | Wt 194.0 lb

## 2018-05-31 DIAGNOSIS — T781XXD Other adverse food reactions, not elsewhere classified, subsequent encounter: Secondary | ICD-10-CM | POA: Diagnosis not present

## 2018-05-31 DIAGNOSIS — J4531 Mild persistent asthma with (acute) exacerbation: Secondary | ICD-10-CM | POA: Diagnosis not present

## 2018-05-31 DIAGNOSIS — J3089 Other allergic rhinitis: Secondary | ICD-10-CM | POA: Diagnosis not present

## 2018-05-31 DIAGNOSIS — T781XXA Other adverse food reactions, not elsewhere classified, initial encounter: Secondary | ICD-10-CM | POA: Insufficient documentation

## 2018-05-31 DIAGNOSIS — J302 Other seasonal allergic rhinitis: Secondary | ICD-10-CM

## 2018-05-31 MED ORDER — ALBUTEROL SULFATE HFA 108 (90 BASE) MCG/ACT IN AERS: INHALATION_SPRAY | RESPIRATORY_TRACT | 1 refills | Status: DC

## 2018-05-31 NOTE — Assessment & Plan Note (Signed)
Past history - 2017 skin testing was positive to grass, weeds, trees, mold, dust mite, cat and cockroach.  Well-controlled with Flonase 2 sprays daily as needed and Benadryl as needed.  Continue medications as needed.  Continue environmental control measures.

## 2018-05-31 NOTE — Progress Notes (Signed)
Follow Up Note  RE: Juliette Standre MRN: [id number] DOB: 1961-05-06 Date of Office Visit: 05/31/2018  Referring provider: Lucretia Kern, DO Primary care provider: Lucretia Kern, DO  Chief Complaint: Cough  History of Present Illness: I had the pleasure of seeing Brian Kocourek for a follow up visit at the [facility] on 05/31/2018. She is a 57 y.o. female, who is being followed for asthma, allergic rhinitis, food allergies. Today she is here for new complaint of worsening coughing. Her previous allergy office visit was on 11/12/2017 with Dr. Ernst Bowler.   1. Mild persistent asthma Patient has been having issues with coughing in the morning and nights which is leading to SOB for the past 4 days. About 2 weeks ago patient had a cold and those symptoms resolved but the cough has lingered.   Currently on Singulair 10mg  daily. Patient used to be on Symbicort years ago but stopped as she was doing well.   Usually has flares about twice a year which sometimes requires a prednisone pak. No recent prednisone this year for the asthma.   2. Chronic allergic rhinitis (grasses, weeds, trees, molds, dust mite, cat, and cockroach) Using Flonase, up to 2 sprays per nostril once daily as needed and taking benadryl as needed with good benefit.   3. Adverse food reaction (shellfish)  Avoiding shellfish. No accidental ingestion.  Assessment and Plan: Delrae is a 57 y.o. female with: Mild persistent asthma with acute exacerbation Persistent coughing for the past 4 days with shortness of breath.  Has been using expired albuterol with no benefit.  Patient had a URI 2 weeks ago.  Currently on Singulair 10 mg daily.  Today's spirometry showed: moderate obstructive disease with 47% improvement in FEV1 post bronchodilator treatment and felt clinically much improved.  Start prednisone taper - given in office.  Start Symbicort 80 2 puffs twice a day for the next 2 weeks. Rinse mouth  afterwards - sample given.  May use albuterol rescue inhaler 2 puffs or nebulizer every 4 to 6 hours as needed for shortness of breath, chest tightness, coughing, and wheezing. May use albuterol rescue inhaler 2 puffs 5 to 15 minutes prior to strenuous physical activities.  At first sign of upper respiratory infection start symbicort 80 2 puffs twice a day for 1-2 weeks.  Seasonal and perennial allergic rhinitis Past history - 2017 skin testing was positive to grass, weeds, trees, mold, dust mite, cat and cockroach.  Well-controlled with Flonase 2 sprays daily as needed and Benadryl as needed.  Continue medications as needed.  Continue environmental control measures.   Adverse food reaction Past history -2017 skin testing was positive to shellfish mix. Interim history - no accidental ingestion of shellfish and continue avoidance.  Has Auvi-Q on hand if needed.  Return in about 4 months (around 09/30/2018).  Meds ordered this encounter  Medications  . albuterol (PROVENTIL HFA) 108 (90 Base) MCG/ACT inhaler    Sig: Inhale two puffs every 4-6 hours if needed for cough or wheeze    Dispense:  1 Inhaler    Refill:  1   Lab Orders  No laboratory test(s) ordered today    Diagnostics: Spirometry:  Tracings reviewed. Her effort: Good reproducible efforts. FVC: 1.51L FEV1: 0.90L, 40% predicted FEV1/FVC ratio: 60% Interpretation: Spirometry consistent with moderate obstructive disease with 47% improvement in FEV1 post bronchodilator treatment.  Please see scanned spirometry results for details.  Medication List:  Current Outpatient Medications  Medication Sig  Dispense Refill  . albuterol (PROVENTIL HFA) 108 (90 Base) MCG/ACT inhaler Inhale two puffs every 4-6 hours if needed for cough or wheeze 1 Inhaler 1  . azelastine (ASTELIN) 0.1 % nasal spray Place 2 sprays into both nostrils 2 (two) times daily. Use in each nostril as directed 30 mL 12  . cetirizine (ZYRTEC) 10 MG tablet Take 10  mg by mouth as needed for allergies.     . diphenhydrAMINE (BENADRYL) 25 MG tablet Take 25 mg by mouth at bedtime as needed for allergies or sleep.     Marland Kitchen docusate sodium (STOOL SOFTENER) 100 MG capsule Take 100 mg by mouth daily.    Marland Kitchen EPINEPHrine (AUVI-Q) 0.3 mg/0.3 mL IJ SOAJ injection Use as directed for severe allergic reaction 2 Device 2  . fluticasone (FLONASE) 50 MCG/ACT nasal spray Place 2 sprays into both nostrils daily as needed for allergies. 16 g 5  . hydrochlorothiazide (HYDRODIURIL) 25 MG tablet TAKE 1 TABLET DAILY 90 tablet 1  . montelukast (SINGULAIR) 10 MG tablet TAKE 1 TABLET AT BEDTIME, MUST HAVE OFFICE VISIT FOR FURTHER REFILLS 90 tablet 0  . polyethylene glycol (MIRALAX / GLYCOLAX) packet Take 17 g by mouth daily.     No current facility-administered medications for this visit.    Allergies: Allergies  Allergen Reactions  . Shellfish Allergy Shortness Of Breath and Itching  . Sulfonamide Derivatives Itching, Swelling and Other (See Comments)    Throat swells  . Nasonex [Mometasone Furoate] Other (See Comments)    Nose bleeds   I reviewed her past medical history, social history, family history, and environmental history and no significant changes have been reported from previous visit on 11/12/2017.  Review of Systems  Constitutional: Negative for appetite change, chills, fever and unexpected weight change.  HENT: Negative for congestion and rhinorrhea.   Eyes: Negative for itching.  Respiratory: Positive for cough, chest tightness, shortness of breath and wheezing.   Gastrointestinal: Negative for abdominal pain.  Skin: Negative for rash.  Neurological: Negative for headaches.   Objective: BP 132/90 (BP Location: Left Arm, Patient Position: Sitting, Cuff Size: Normal)   Pulse 66   Temp 99.1 F (37.3 C) (Oral)   Resp 20   Ht 5\' 4"  (1.626 m)   Wt 194 lb (88 kg)   LMP 03/30/2012   SpO2 96%   BMI 33.30 kg/m  Body mass index is 33.3 kg/m. Physical Exam    Constitutional: She is oriented to person, place, and time. She appears well-developed and well-nourished.  HENT:  Head: Normocephalic and atraumatic.  Right Ear: External ear normal.  Left Ear: External ear normal.  Nose: Nose normal.  Mouth/Throat: Oropharynx is clear and moist.  Eyes: Conjunctivae and EOM are normal.  Neck: Neck supple.  Cardiovascular: Normal rate, regular rhythm and normal heart sounds. Exam reveals no gallop and no friction rub.  No murmur heard. Pulmonary/Chest: Effort normal and breath sounds normal. She has no wheezes. She has no rales.  Coughing during exam. Improved after nebulizer treatment.  Lymphadenopathy:    She has no cervical adenopathy.  Neurological: She is alert and oriented to person, place, and time.  Skin: Skin is warm. No rash noted.  Psychiatric: She has a normal mood and affect. Her behavior is normal.  Nursing note and vitals reviewed.  Previous notes and tests were reviewed. The plan was reviewed with the patient/family, and all questions/concerned were addressed.  It was my pleasure to see Ak[place] today and participate in her care. Please  feel free to contact me with any questions or concerns.  Sincerely,  Rexene Alberts, DO Allergy & Immunology  [facility] office: [phone] [place]

## 2018-05-31 NOTE — Patient Instructions (Addendum)
Start prednisone taper Start Symbicort 80 2 puffs twice a day for the next 2 weeks. Rinse mouth afterwards - sample given.  May use albuterol rescue inhaler 2 puffs or nebulizer every 4 to 6 hours as needed for shortness of breath, chest tightness, coughing, and wheezing. May use albuterol rescue inhaler 2 puffs 5 to 15 minutes prior to strenuous physical activities.  At first sign of upper respiratory infection start symbicort 2 puffs twice a day for 1-2 weeks.  May use Flonase 2 sprays daily as needed. May use over the counter antihistamines such as Zyrtec (cetirizine), Claritin (loratadine), Allegra (fexofenadine), or Xyzal (levocetirizine) daily as needed.  Avoid shellfish. For mild symptoms you can take over the counter antihistamines such as Benadryl and monitor symptoms closely. If symptoms worsen or if you have severe symptoms including breathing issues, throat closure, significant swelling, whole body hives, severe diarrhea and vomiting, lightheadedness then inject epinephrine and seek immediate medical care afterwards.  Follow up in 4 months

## 2018-05-31 NOTE — Assessment & Plan Note (Signed)
Past history -2017 skin testing was positive to shellfish mix. Interim history - no accidental ingestion of shellfish and continue avoidance.  Has Auvi-Q on hand if needed.

## 2018-05-31 NOTE — Assessment & Plan Note (Signed)
Persistent coughing for the past 4 days with shortness of breath.  Has been using expired albuterol with no benefit.  Patient had a URI 2 weeks ago.  Currently on Singulair 10 mg daily.  Today's spirometry showed: moderate obstructive disease with 47% improvement in FEV1 post bronchodilator treatment and felt clinically much improved.  Start prednisone taper - given in office.  Start Symbicort 80 2 puffs twice a day for the next 2 weeks. Rinse mouth afterwards - sample given.  May use albuterol rescue inhaler 2 puffs or nebulizer every 4 to 6 hours as needed for shortness of breath, chest tightness, coughing, and wheezing. May use albuterol rescue inhaler 2 puffs 5 to 15 minutes prior to strenuous physical activities.  At first sign of upper respiratory infection start symbicort 80 2 puffs twice a day for 1-2 weeks.

## 2018-06-22 ENCOUNTER — Ambulatory Visit: Payer: 59 | Admitting: Allergy & Immunology

## 2018-07-03 ENCOUNTER — Other Ambulatory Visit: Payer: Self-pay | Admitting: Family Medicine

## 2018-07-05 ENCOUNTER — Other Ambulatory Visit: Payer: Self-pay

## 2018-07-05 ENCOUNTER — Ambulatory Visit
Admission: RE | Admit: 2018-07-05 | Discharge: 2018-07-05 | Disposition: A | Discharge status: Home / Self Care | Payer: 59 | Source: Ambulatory Visit | Attending: Family Medicine | Admitting: Family Medicine

## 2018-07-05 DIAGNOSIS — Z1231 Encounter for screening mammogram for malignant neoplasm of breast: Secondary | ICD-10-CM

## 2018-07-05 MED ORDER — HYDROCHLOROTHIAZIDE 25 MG PO TABS: 25.0000 mg | ORAL_TABLET | Freq: Every day | ORAL | 0 refills | Status: DC

## 2018-08-04 ENCOUNTER — Other Ambulatory Visit: Payer: Self-pay | Admitting: Family Medicine

## 2018-08-04 NOTE — Telephone Encounter (Signed)
Copied from Start [phone]. Topic: Quick Communication - Rx Refill/Question >> Aug 04, 2018  9:36 AM Bea Graff, NT wrote: Medication: hydrochlorothiazide (HYDRODIURIL) 25 MG tablet   Has the patient contacted their pharmacy? Yes.   (Agent: If no, request that the patient contact the pharmacy for the refill.) (Agent: If yes, when and what did the pharmacy advise?)  Preferred Pharmacy (with phone number or street name): [facility], [place] [phone] (Phone) [phone] (Fax)    Agent: Please be advised that RX refills may take up to 3 business days. We ask that you follow-up with your pharmacy.

## 2018-08-05 ENCOUNTER — Telehealth: Payer: Self-pay | Admitting: Family Medicine

## 2018-08-05 MED ORDER — HYDROCHLOROTHIAZIDE 25 MG PO TABS: 25.0000 mg | ORAL_TABLET | Freq: Every day | ORAL | 1 refills | Status: DC

## 2018-08-05 NOTE — Telephone Encounter (Signed)
I called the pt and informed her the Rx was sent.

## 2018-08-05 NOTE — Telephone Encounter (Signed)
Rx done. 

## 2018-08-05 NOTE — Telephone Encounter (Signed)
Spoke to the pt.  She was scheduled to see Tommi Rumps on 08/13/2018 for her annual visit and medication refills.  Advised that she will need to see Dr. Maudie Mercury for that appointment.  Moved her appt to 09/09/2018 for 30 minute appt @ 4:15.  This was the only time she could come due to work schedule.  Pt would like refill of BP med until then.  Advised pt that I will send a message to Dr. Maudie Mercury and Mechele Claude.  Pt would like a call when medication has been called in.

## 2018-08-09 ENCOUNTER — Other Ambulatory Visit: Payer: Self-pay | Admitting: Allergy & Immunology

## 2018-08-13 ENCOUNTER — Ambulatory Visit: Payer: 59 | Admitting: Adult Health

## 2018-09-07 ENCOUNTER — Telehealth: Payer: Self-pay | Admitting: Obstetrics and Gynecology

## 2018-09-07 NOTE — Telephone Encounter (Signed)
[place] for office visit only at this time.

## 2018-09-07 NOTE — Telephone Encounter (Signed)
Patient has a sharp pain near ovaries on left side. Wanted Thursday appointment so scheduled for 1:00.

## 2018-09-07 NOTE — Telephone Encounter (Signed)
Spoke with patient. Patient reports intermittent sharp pain in left side of pelvis "where ovary is, feels like menstrual cramps", started 3/9. Denies any pain right now. Denies vaginal d/c, bleeding, odor, urinary symptoms, fever/chills. Nausea, no vomiting. Normal bowel movements, last BM 3/10. Has not tried anything over the counter, plans to try OTC motrin if pain returns. Postmenopausal. Declines earlier OV due to work schedule. Scheduled for 3/12 at 1300 with Dr. [name].   Advised to return call for earlier OV if symptoms worsen or new symptoms develop, ER precautions provided for after hours.  I will review with Dr. [name] and return call if any additional recommendations. Patient verbalizes understanding.   Last AEX   Dr. [name] -please review. [place] for OV only?

## 2018-09-07 NOTE — Telephone Encounter (Signed)
Encounter closed

## 2018-09-09 ENCOUNTER — Encounter: Payer: Self-pay | Admitting: Family Medicine

## 2018-09-09 ENCOUNTER — Ambulatory Visit (INDEPENDENT_AMBULATORY_CARE_PROVIDER_SITE_OTHER): Payer: 59 | Admitting: Family Medicine

## 2018-09-09 ENCOUNTER — Other Ambulatory Visit: Payer: Self-pay

## 2018-09-09 ENCOUNTER — Encounter: Payer: Self-pay | Admitting: Obstetrics and Gynecology

## 2018-09-09 ENCOUNTER — Ambulatory Visit (INDEPENDENT_AMBULATORY_CARE_PROVIDER_SITE_OTHER): Payer: 59 | Admitting: Obstetrics and Gynecology

## 2018-09-09 VITALS — BP 108/80 | HR 64 | Temp 98.8°F | Ht 64.0 in | Wt 182.3 lb

## 2018-09-09 VITALS — BP 110/60 | HR 68 | Temp 98.6°F | Resp 14 | Ht 64.0 in | Wt 182.0 lb

## 2018-09-09 DIAGNOSIS — R102 Pelvic and perineal pain: Secondary | ICD-10-CM

## 2018-09-09 DIAGNOSIS — R8290 Unspecified abnormal findings in urine: Secondary | ICD-10-CM

## 2018-09-09 DIAGNOSIS — R739 Hyperglycemia, unspecified: Secondary | ICD-10-CM | POA: Diagnosis not present

## 2018-09-09 DIAGNOSIS — J4520 Mild intermittent asthma, uncomplicated: Secondary | ICD-10-CM

## 2018-09-09 DIAGNOSIS — K429 Umbilical hernia without obstruction or gangrene: Secondary | ICD-10-CM | POA: Diagnosis not present

## 2018-09-09 DIAGNOSIS — I10 Essential (primary) hypertension: Secondary | ICD-10-CM

## 2018-09-09 LAB — POCT URINALYSIS DIPSTICK
Bilirubin, UA: NEGATIVE
Glucose, UA: NEGATIVE
Ketones, UA: NEGATIVE
Nitrite, UA: NEGATIVE
PH UA: 5 (ref 5.0–8.0)
Protein, UA: NEGATIVE
Urobilinogen, UA: NEGATIVE E.U./dL — AB

## 2018-09-09 MED ORDER — HYDROCHLOROTHIAZIDE 25 MG PO TABS: 25.0000 mg | ORAL_TABLET | Freq: Every day | ORAL | 1 refills | Status: DC

## 2018-09-09 NOTE — Progress Notes (Signed)
GYNECOLOGY  VISIT   HPI: 58 y.o.   Married  [place] American  female   [phone] with Patient's last menstrual period was 03/30/2012.   here for pelvic pain.    LLQ pain for a couple of days, has now gone away.  Pain was a 6 or 7 and is now a 1.  Pain was sharp and radiated across her abdomen toward the midline.  Felt like her menses was about to start.  Pain occurred while sitting at her desk and occurred a couple of days in a row. The first day it lasted all day.  The second day, the pain went away with Ibuprofen. No increased activity.  Some nausea.  No vomiting, diarrhea, fever, hematuria or dysuria.  Umbilicus did feel more firm.   Last BM was this am.  Usually has a BM once a day and takes stool softeners and Miralax.  No bleeding or spotting. No previous pain like this.   GYNECOLOGIC HISTORY: Patient's last menstrual period was 03/30/2012. Contraception:  Postmenopausal Menopausal hormone therapy:  none Last mammogram:  07/05/18 BIRADS 1 negative/density b Last pap smear:   04/26/18 Neg:Neg HR HPV        OB History    Gravida  2   Para  2   Term  2   Preterm      AB      Living  2     SAB      TAB      Ectopic      Multiple      Live Births  1              Patient Active Problem List   Diagnosis Date Noted  . Mild persistent asthma with acute exacerbation 05/31/2018  . Adverse food reaction 05/31/2018  . Mild persistent asthma, uncomplicated 72/02/4708  . Restrictive airway disease 11/12/2017  . Anaphylactic shock due to adverse food reaction 11/12/2017  . Chronic seasonal allergic rhinitis due to fungal spores 11/12/2017  . Class 1 obesity without serious comorbidity with body mass index (BMI) of 31.0 to 31.9 in adult 10/21/2016  . Chronic constipation 10/21/2016  . Seasonal and perennial allergic rhinitis 10/05/2007  . Asthma, mild intermittent, well-controlled 10/05/2007    Past Medical History:  Diagnosis Date  . Abnormal Pap smear of  cervix   . Allergic rhinitis   . 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 Asthma   . GERD (gastroesophageal reflux disease)   . Headache(784.0)    tension- not an issue  . Hypertension     Past Surgical History:  Procedure Laterality Date  . BALLOON DILATION N/A 11/19/2015   Procedure: BALLOON DILATION;  Surgeon: Garlan Fair, MD;  Location: [facility];  Service: Endoscopy;  Laterality: N/A;  . BUNIONECTOMY Bilateral   . COLONOSCOPY WITH PROPOFOL N/A 10/05/2012   Procedure: COLONOSCOPY WITH PROPOFOL;  Surgeon: Garlan Fair, MD;  Location: [facility];  Service: Endoscopy;  Laterality: N/A;  . ESOPHAGOGASTRODUODENOSCOPY (EGD) WITH PROPOFOL N/A 11/19/2015   Procedure: ESOPHAGOGASTRODUODENOSCOPY (EGD) WITH PROPOFOL;  Surgeon: Garlan Fair, MD;  Location: [facility];  Service: Endoscopy;  Laterality: N/A;  . TUBAL LIGATION  27 years ago    Current Outpatient Medications  Medication Sig Dispense Refill  . albuterol (PROVENTIL HFA) 108 (90 Base) MCG/ACT inhaler Inhale two puffs every 4-6 hours if needed for cough or wheeze 1 Inhaler 1  . azelastine (ASTELIN) 0.1 % nasal spray Place 2 sprays into both nostrils  2 (two) times daily. Use in each nostril as directed 30 mL 12  . cetirizine (ZYRTEC) 10 MG tablet Take 10 mg by mouth as needed for allergies.     . diphenhydrAMINE (BENADRYL) 25 MG tablet Take 25 mg by mouth at bedtime as needed for allergies or sleep.     Marland Kitchen docusate sodium (STOOL SOFTENER) 100 MG capsule Take 100 mg by mouth daily.    Marland Kitchen EPINEPHrine (AUVI-Q) 0.3 mg/0.3 mL IJ SOAJ injection Use as directed for severe allergic reaction 2 Device 2  . fluticasone (FLONASE) 50 MCG/ACT nasal spray Place 2 sprays into both nostrils daily as needed for allergies. 16 g 5  . hydrochlorothiazide (HYDRODIURIL) 25 MG tablet Take 1 tablet (25 mg total) by mouth daily. 30 tablet 1  . montelukast (SINGULAIR) 10 MG tablet Take 1 tablet at bedtime. 90 tablet 4  . polyethylene glycol (MIRALAX /  GLYCOLAX) packet Take 17 g by mouth daily.     No current facility-administered medications for this visit.      ALLERGIES: Shellfish allergy; Sulfonamide derivatives; and Nasonex [mometasone furoate]  Family History  Problem Relation Age of Onset  . Emphysema Father   . Lung cancer Father   . High Cholesterol Mother   . Osteoporosis Mother   . Hypertension Mother   . Migraines Daughter   . Hypertension Son        exercise  . Asthma Son   . Hypertension Sister   . COPD Other   . Breast cancer Neg Hx     Social History   Socioeconomic History  . Marital status: Married    Spouse name: Not on file  . Number of children: 2  . Years of education: Not on file  . Highest education level: Not on file  Occupational History  . Occupation: Biochemist, clinical,    Employer: [employer]  Social Needs  . Financial resource strain: Not on file  . Food insecurity:    Worry: Not on file    Inability: Not on file  . Transportation needs:    Medical: Not on file    Non-medical: Not on file  Tobacco Use  . Smoking status: Never Smoker  . Smokeless tobacco: Never Used  Substance and Sexual Activity  . Alcohol use: No  . Drug use: No  . Sexual activity: Yes    Partners: Male    Birth control/protection: Surgical, Post-menopausal    Comment: Tubal Ligation  Lifestyle  . Physical activity:    Days per week: Not on file    Minutes per session: Not on file  . Stress: Not on file  Relationships  . Social connections:    Talks on phone: Not on file    Gets together: Not on file    Attends religious service: Not on file    Active member of club or organization: Not on file    Attends meetings of clubs or organizations: Not on file    Relationship status: Not on file  . Intimate partner violence:    Fear of current or ex partner: Not on file    Emotionally abused: Not on file    Physically abused: Not on file    Forced sexual activity: Not on file  Other Topics Concern  .  Not on file  Social History Narrative   Work or School: [employer] - full time, like job      Home Situation: lives with husband      Spiritual Beliefs:  Christian - baptist      Lifestyle: no regular exercise, diet "bad"    Review of Systems  Constitutional: Negative.   HENT: Negative.   Eyes: Negative.   Respiratory: Negative.   Cardiovascular: Negative.   Gastrointestinal: Negative.   Endocrine: Negative.   Genitourinary:       Pelvic pain Lower left quadrant pain  Musculoskeletal: Negative.   Skin: Negative.   Allergic/Immunologic: Negative.   Neurological: Negative.   Hematological: Negative.   Psychiatric/Behavioral: Negative.     PHYSICAL EXAMINATION:    BP 110/60 (BP Location: Right Arm, Patient Position: Sitting, Cuff Size: Normal)   Pulse 68   Temp 98.6 F (37 C) (Oral)   Resp 14   Ht 5\' 4"  (1.626 m)   Wt 182 lb (82.6 kg)   LMP 03/30/2012   BMI 31.24 kg/m     General appearance: alert, cooperative and appears stated age Lungs: clear to auscultation bilaterally Heart: regular rate and rhythm Abdomen: umbilical hernia - reducible.  Abdomen is soft, non-tender, no masses,  no organomegaly Back:  No CVA tenderness.  Extremities: extremities normal, atraumatic, no cyanosis or edema Lymph nodes: Cervical, supraclavicular, and axillary nodes normal. No abnormal inguinal nodes palpated Neurologic: Grossly normal  Pelvic: External genitalia:  no lesions              Urethra:  normal appearing urethra with no masses, tenderness or lesions              Bartholins and Skenes: normal                 Vagina: normal appearing vagina with normal color and discharge, no lesions              Cervix: no lesions.  No CMT.                 Bimanual Exam:  Uterus:  normal size, contour, position, consistency, mobility, non-tender              Adnexa: no mass, fullness, minimal tenderness on right, mild left adnexal tenderness without mass.               Rectal  exam: Yes.  .  Confirms.              Anus:  normal sphincter tone, no lesions  Chaperone was present for exam.  ASSESSMENT  Umbilical hernia.  LLQ pain, essentially resolved.  Abnormal urine.   PLAN  We discussed possible etiologies for pain - diverticulitis, ovarian, hernia.  If pain recurs, take Ibuprofen and call office for potential [place].  I discussed CT scan as a way to dx diverticulitis.   We reviewed signs and symptoms of incarcerated bowel with hernia.  Urine micro and culture.  Fu for annual exam and prn.   An After Visit Summary was printed and given to the patient.  __15____ minutes face to face time of which over 50% was spent in counseling.

## 2018-09-09 NOTE — Progress Notes (Signed)
HPI:  Using dictation device. Unfortunately this device frequently misinterprets words/phrases.  Victoria [name] is a pleasant 58 yo with a PMH significant for chronic LBP (sees Dr. Louanne Skye in orthopedics for management), asthma and allergies (sees allergy specialist for management), obesity and HTN. She had urine testing with her gynecologist today and did lipid testing there in October.  Reports doing well and is without complaints. Denies cp, sob, doe. Reports has been eating healthy, but is planning to start getting some exercise - none now.  ROS: See pertinent positives and negatives per HPI.  Past Medical History:  Diagnosis Date  . Abnormal Pap smear of cervix   . Allergic rhinitis   . 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 Asthma   . GERD (gastroesophageal reflux disease)   . Headache(784.0)    tension- not an issue  . Hypertension     Past Surgical History:  Procedure Laterality Date  . BALLOON DILATION N/A 11/19/2015   Procedure: BALLOON DILATION;  Surgeon: Garlan Fair, MD;  Location: [facility];  Service: Endoscopy;  Laterality: N/A;  . BUNIONECTOMY Bilateral   . COLONOSCOPY WITH PROPOFOL N/A 10/05/2012   Procedure: COLONOSCOPY WITH PROPOFOL;  Surgeon: Garlan Fair, MD;  Location: [facility];  Service: Endoscopy;  Laterality: N/A;  . ESOPHAGOGASTRODUODENOSCOPY (EGD) WITH PROPOFOL N/A 11/19/2015   Procedure: ESOPHAGOGASTRODUODENOSCOPY (EGD) WITH PROPOFOL;  Surgeon: Garlan Fair, MD;  Location: [facility];  Service: Endoscopy;  Laterality: N/A;  . TUBAL LIGATION  27 years ago    Family History  Problem Relation Age of Onset  . Emphysema Father   . Lung cancer Father   . High Cholesterol Mother   . Osteoporosis Mother   . Hypertension Mother   . Migraines Daughter   . Hypertension Son        exercise  . Asthma Son   . Hypertension Sister   . COPD Other   . Breast cancer Neg Hx     SOCIAL HX: see hpi   Current Outpatient Medications:  .   albuterol (PROVENTIL HFA) 108 (90 Base) MCG/ACT inhaler, Inhale two puffs every 4-6 hours if needed for cough or wheeze, Disp: 1 Inhaler, Rfl: 1 .  azelastine (ASTELIN) 0.1 % nasal spray, Place 2 sprays into both nostrils 2 (two) times daily. Use in each nostril as directed, Disp: 30 mL, Rfl: 12 .  cetirizine (ZYRTEC) 10 MG tablet, Take 10 mg by mouth as needed for allergies. , Disp: , Rfl:  .  diphenhydrAMINE (BENADRYL) 25 MG tablet, Take 25 mg by mouth at bedtime as needed for allergies or sleep. , Disp: , Rfl:  .  docusate sodium (STOOL SOFTENER) 100 MG capsule, Take 100 mg by mouth daily., Disp: , Rfl:  .  EPINEPHrine (AUVI-Q) 0.3 mg/0.3 mL IJ SOAJ injection, Use as directed for severe allergic reaction, Disp: 2 Device, Rfl: 2 .  fluticasone (FLONASE) 50 MCG/ACT nasal spray, Place 2 sprays into both nostrils daily as needed for allergies., Disp: 16 g, Rfl: 5 .  hydrochlorothiazide (HYDRODIURIL) 25 MG tablet, Take 1 tablet (25 mg total) by mouth daily., Disp: 90 tablet, Rfl: 1 .  montelukast (SINGULAIR) 10 MG tablet, Take 1 tablet at bedtime., Disp: 90 tablet, Rfl: 4 .  polyethylene glycol (MIRALAX / GLYCOLAX) packet, Take 17 g by mouth daily., Disp: , Rfl:   EXAM:  Vitals:   09/09/18 1549  BP: 108/80  Pulse: 64  Temp: 98.8 F (37.1 C)    Body mass index  is 31.29 kg/m.  GENERAL: vitals reviewed and listed above, alert, oriented, appears well hydrated and in no acute distress  HEENT: atraumatic, conjunttiva clear, no obvious abnormalities on inspection of external nose and ears  NECK: no obvious masses on inspection  LUNGS: clear to auscultation bilaterally, no wheezes, rales or rhonchi, good air movement  CV: HRRR, no peripheral edema  MS: moves all extremities without noticeable abnormality  PSYCH: pleasant and cooperative, no obvious depression or anxiety  ASSESSMENT AND PLAN:  Discussed the following assessment and plan:  Essential hypertension - Plan: Basic metabolic  panel, CBC  Hyperglycemia  Asthma, mild intermittent, well-controlled  -labs per orders -lifestyle recs -refilled medications -advised to establish with new provider as I will ne transitioning out of clinical medicine - she plans to go to a clinic closer to her home and agrees to set up appt/request records if outside of [facility] or [facility]. -follow up here as needed in the next 30 days  Patient Instructions  BEFORE YOU LEAVE: -labs   We have ordered labs or studies at this visit. It can take up to 1-2 weeks for results and processing. IF results require follow up or explanation, we will call you with instructions. Clinically stable results will be released to your [facility]. If you have not heard from [place] or cannot find your results in [facility] in 2 weeks please contact our office at [phone].  If you are not yet signed up for [facility], please consider signing up.           Lucretia Kern, DO

## 2018-09-09 NOTE — Patient Instructions (Addendum)
BEFORE YOU LEAVE: -labs  We have ordered labs or studies at this visit. It can take up to 1-2 weeks for results and processing. IF results require follow up or explanation, we will call you with instructions. Clinically stable results will be released to your [facility]. If you have not heard from [place] or cannot find your results in [facility] in 2 weeks please contact our office at [phone].  If you are not yet signed up for [facility], please consider signing up.

## 2018-09-09 NOTE — Addendum Note (Signed)
Addended by: Agnes Lawrence on: 09/09/2018 04:19 PM   Modules accepted: Orders

## 2018-09-10 LAB — CBC
HEMATOCRIT: 38.1 % (ref 36.0–46.0)
Hemoglobin: 12.9 g/dL (ref 12.0–15.0)
MCHC: 33.9 g/dL (ref 30.0–36.0)
MCV: 84.7 fl (ref 78.0–100.0)
Platelets: 217 10*3/uL (ref 150.0–400.0)
RBC: 4.5 Mil/uL (ref 3.87–5.11)
RDW: 15 % (ref 11.5–15.5)
WBC: 5.3 10*3/uL (ref 4.0–10.5)

## 2018-09-10 LAB — URINALYSIS, MICROSCOPIC ONLY
BACTERIA UA: NONE SEEN
Casts: NONE SEEN /lpf

## 2018-09-10 LAB — BASIC METABOLIC PANEL
BUN: 15 mg/dL (ref 6–23)
CO2: 31 mEq/L (ref 19–32)
Calcium: 9.8 mg/dL (ref 8.4–10.5)
Chloride: 100 mEq/L (ref 96–112)
Creatinine, Ser: 0.88 mg/dL (ref 0.40–1.20)
GFR: 80.41 mL/min (ref >60.00)
Glucose, Bld: 107 mg/dL — ABNORMAL HIGH (ref 70–99)
Potassium: 3.4 mEq/L — ABNORMAL LOW (ref 3.5–5.1)
Sodium: 141 mEq/L (ref 135–145)

## 2018-09-10 LAB — URINE CULTURE

## 2018-11-02 ENCOUNTER — Other Ambulatory Visit: Payer: Self-pay

## 2018-11-02 MED ORDER — EPINEPHRINE 0.3 MG/0.3ML IJ SOAJ: INTRAMUSCULAR | 2 refills | Status: DC

## 2019-03-08 ENCOUNTER — Other Ambulatory Visit: Payer: Self-pay | Admitting: Family Medicine

## 2019-03-22 ENCOUNTER — Encounter: Payer: Self-pay | Admitting: Allergy & Immunology

## 2019-03-22 ENCOUNTER — Other Ambulatory Visit: Payer: Self-pay

## 2019-03-22 ENCOUNTER — Ambulatory Visit (INDEPENDENT_AMBULATORY_CARE_PROVIDER_SITE_OTHER): Payer: 59 | Admitting: Allergy & Immunology

## 2019-03-22 VITALS — BP 162/98 | HR 60 | Temp 97.6°F | Resp 16 | Ht 64.0 in | Wt 186.2 lb

## 2019-03-22 DIAGNOSIS — J4530 Mild persistent asthma, uncomplicated: Secondary | ICD-10-CM

## 2019-03-22 DIAGNOSIS — T7800XD Anaphylactic reaction due to unspecified food, subsequent encounter: Secondary | ICD-10-CM | POA: Diagnosis not present

## 2019-03-22 DIAGNOSIS — J984 Other disorders of lung: Secondary | ICD-10-CM

## 2019-03-22 MED ORDER — MONTELUKAST SODIUM 10 MG PO TABS: ORAL_TABLET | ORAL | 3 refills | Status: DC

## 2019-03-22 MED ORDER — ALBUTEROL SULFATE HFA 108 (90 BASE) MCG/ACT IN AERS: INHALATION_SPRAY | RESPIRATORY_TRACT | 1 refills | Status: DC

## 2019-03-22 MED ORDER — FLUTICASONE PROPIONATE 50 MCG/ACT NA SUSP: 2.0000 | Freq: Every day | NASAL | 5 refills | Status: DC | PRN

## 2019-03-22 MED ORDER — LEVOCETIRIZINE DIHYDROCHLORIDE 5 MG PO TABS: 5.0000 mg | ORAL_TABLET | Freq: Every evening | ORAL | 5 refills | Status: DC

## 2019-03-22 MED ORDER — EPINEPHRINE 0.3 MG/0.3ML IJ SOAJ: INTRAMUSCULAR | 2 refills | Status: DC

## 2019-03-22 MED ORDER — AZELASTINE HCL 0.1 % NA SOLN: 2.0000 | Freq: Two times a day (BID) | NASAL | 5 refills | Status: DC

## 2019-03-22 MED ORDER — PAZEO 0.7 % OP SOLN: 1.0000 [drp] | OPHTHALMIC | 5 refills | Status: DC

## 2019-03-22 NOTE — Patient Instructions (Addendum)
1. Mild persistent asthma, uncomplicated - Lung testing looked similar to the last visit.   - Continue with Singulair 10mg  daily. - We will continue with Proventil 4 puffs every 4-6 hours as needed for coughing/wheezing.  2. Chronic allergic rhinitis (grasses, weeds, trees, molds, dust mite, cat, and cockroach) - Continue with a nasal steroid spray such as Flonase and Azelastine up to 2 sprays per nostril once daily. - Stop Pataday and start Pazeo one drop per eye daily. - Samples of Zerviate eye drops provided as well (twice daily as needed).  - Lost of insurances are not covering the Zerviate at this time.    3. Adverse food reaction (shellfish)  - We will send in a new prescription for AuviQ. - Continue to avoid shellfish for now.  4. Return in about 6 months (around 09/19/2019). This can be an in-person, a virtual Webex or a telephone follow up visit.   Please inform us of any Emergency Department visits, hospitalizations, or changes in symptoms. Call us before going to the ED for breathing or allergy symptoms since we might be able to fit you in for a sick visit. Feel free to contact us anytime with any questions, problems, or concerns.  It was a pleasure to see you again today!  Websites that have reliable patient information: 1. American Academy of Asthma, Allergy, and Immunology: www.aaaai.org 2. Food Allergy Research and Education (FARE): foodallergy.org 3. Mothers of Asthmatics: http://www.asthmacommunitynetwork.org 4. American College of Allergy, Asthma, and Immunology: www.acaai.org  "Like" [place] on Facebook and Instagram for our latest updates!      Make sure you are registered to vote! If you have moved or changed any of your contact information, you will need to get this updated before voting!  In some cases, you MAY be able to register to vote online: [email or web address]    Voter ID laws are NOT going into effect for the General Election  in November 2020! DO NOT let this stop you from exercising your right to vote!   Absentee voting is the SAFEST way to vote during the coronavirus pandemic!   Download and print an absentee ballot request form at [email or web address] or you can scan the QR code below with your smart phone:      More information on absentee ballots can be found here: [email or web address]  [place] VOTING SITES have been established! You can register to vote and cast your vote on the same day at these locations. See this site for more information on what you need to register to vote: [email or web address]

## 2019-03-22 NOTE — Progress Notes (Signed)
FOLLOW UP  Date of Service/Encounter:  03/22/19   Assessment:   Mild persistent asthma, uncomplicated  Seasonal and perennial allergic rhinitis (grasses, weeds, trees, molds, dust mite, cat, and cockroach)  Anaphylactic shock due to food (shellfish)  Restrictive airway disease with stable spirometry and no worsening symptoms (actually fairly well controlled)  Plan/Recommendations:   1. Mild persistent asthma, uncomplicated - Lung testing looked similar to the last visit.   - Continue with Singulair 10mg  daily. - We will continue with Proventil 4 puffs every 4-6 hours as needed for coughing/wheezing.  2. Chronic allergic rhinitis (grasses, weeds, trees, molds, dust mite, cat, and cockroach) - Continue with a nasal steroid spray such as Flonase and Azelastine up to 2 sprays per nostril once daily. - Stop Pataday and start Pazeo one drop per eye daily. - Samples of Zerviate eye drops provided as well (twice daily as needed).  - Lost of insurances are not covering the Zerviate at this time.    3. Adverse food reaction (shellfish)  - We will send in a new prescription for AuviQ. - Continue to avoid shellfish for now.  4. Return in about 6 months (around 09/19/2019). This can be an in-person, a virtual Webex or a telephone follow up visit.   Subjective:   Victoria [name] is a 58 y.o. female presenting today for follow up of  Chief Complaint  Patient presents with  . Asthma    doing okay  . Allergic Rhinitis     eyes itching, pataday not helping    Victoria [name] has a history of the following: Patient Active Problem List   Diagnosis Date Noted  . Mild persistent asthma with acute exacerbation 05/31/2018  . Adverse food reaction 05/31/2018  . Mild persistent asthma, uncomplicated 0000000  . Restrictive airway disease 11/12/2017  . Anaphylactic shock due to adverse food reaction 11/12/2017  . Chronic seasonal allergic rhinitis due to fungal spores  11/12/2017  . Class 1 obesity without serious comorbidity with body mass index (BMI) of 31.0 to 31.9 in adult 10/21/2016  . Chronic constipation 10/21/2016  . Seasonal and perennial allergic rhinitis 10/05/2007  . Asthma, mild intermittent, well-controlled 10/05/2007    History obtained from: chart review and patient.  Victoria [name] is a 58 y.o. female presenting for a follow up visit.  She was last seen in May 2019 by me.  At that time, her lung testing looked similar.  We continued Singulair 10 mg daily and albuterol 4 puffs every 4-6 hours as needed.  For her chronic allergic rhinitis, we continued with Flonase 2 sprays per nostril once daily as well as over-the-counter antihistamines as needed.  She has a history of anaphylaxis to shellfish.  We sent in a new prescription for Auvi-Q.  In the interim, she did come to our office to see Dr. Maudie Mercury for a sick visit.  She was having increased coughing.  She was started on a prednisone taper as well as Symbicort 80/4.5 mcg 2 puffs twice daily for 2 weeks.  The remainder of her medications were well controlled.  Since the last visit, she has done well.   Asthma/Respiratory Symptom History: She remaions only on Singulair daily. She is not needing her rescue inhaler miuch at all. She did use it this past Saturday when walking outdoors. Glory's asthma has been well controlled. She has not required rescue medication, experienced nocturnal awakenings due to lower respiratory symptoms, nor have activities of daily living been limited. She has required no Emergency  Department or Urgent Care visits for her asthma. She has required zero courses of systemic steroids for asthma exacerbations since the last visit. ACT score today is 23, indicating excellent asthma symptom control.   Allergic Rhinitis Symptom History: She is having a lot of itching of her eyes. She has used Pataday which helps only temporarily. She has been on that for years at this point and only uses it as  needed. She remains on azelastine as needed. She does not think that she has been on Xyzal but she is interested in getting this on board.   Food Allergy Symptom History: She continues to avoid shellfish. She is not interested in retesting. She does need a new EpiPen.   Otherwise, there have been no changes to her past medical history, surgical history, family history, or social history.    Review of Systems  Constitutional: Negative.  Negative for chills, fever, malaise/fatigue and weight loss.  HENT: Negative.  Negative for congestion, ear discharge and ear pain.   Eyes: Negative for pain, discharge and redness.  Respiratory: Negative for cough, sputum production, shortness of breath and wheezing.   Cardiovascular: Negative.  Negative for chest pain and palpitations.  Gastrointestinal: Negative for abdominal pain, constipation, diarrhea, heartburn, nausea and vomiting.  Skin: Negative.  Negative for itching and rash.  Neurological: Negative for dizziness and headaches.  Endo/Heme/Allergies: Negative for environmental allergies. Does not bruise/bleed easily.       Objective:   Blood pressure (!) 162/98, pulse 60, temperature 97.6 F (36.4 C), temperature source Temporal, resp. rate 16, height 5\' 4"  (1.626 m), weight 186 lb 3.2 oz (84.5 kg), last menstrual period 03/30/2012, SpO2 100 %. Body mass index is 31.96 kg/m.   Physical Exam:  Physical Exam  Constitutional: She appears well-developed.  Pleasant female. Talkative.   HENT:  Head: Normocephalic and atraumatic.  Right Ear: Tympanic membrane, external ear and ear canal normal.  Left Ear: Tympanic membrane, external ear and ear canal normal.  Nose: Mucosal edema and rhinorrhea present. No nasal deformity or septal deviation. No epistaxis. Right sinus exhibits no maxillary sinus tenderness and no frontal sinus tenderness. Left sinus exhibits no maxillary sinus tenderness and no frontal sinus tenderness.  Mouth/Throat: Uvula  is midline and oropharynx is clear and moist. Mucous membranes are not pale and not dry.  Eyes: Pupils are equal, round, and reactive to light. Conjunctivae and EOM are normal. Right eye exhibits no chemosis and no discharge. Left eye exhibits no chemosis and no discharge. Right conjunctiva is not injected. Left conjunctiva is not injected.  Cardiovascular: Normal rate, regular rhythm and normal heart sounds.  Respiratory: Effort normal and breath sounds normal. No accessory muscle usage. No tachypnea. No respiratory distress. She has no wheezes. She has no rhonchi. She has no rales. She exhibits no tenderness.  Moving air well in all lung fields.   Lymphadenopathy:    She has no cervical adenopathy.  Neurological: She is alert.  Skin: No abrasion, no petechiae and no rash noted. Rash is not papular, not vesicular and not urticarial. No erythema. No pallor.  No eczematous or urticarial lesions noted.   Psychiatric: She has a normal mood and affect.     Diagnostic studies:    Spirometry: results abnormal (FEV1: 1.11/50%, FVC: 1.79/64%, FEV1/FVC: 62%).    Spirometry consistent with possible restrictive disease. Overall values are stable compared to previous studies. While she has responded to prednisone in the past, she feels symptomatically stable today with albuterol only every month  or so. She declines further workup at this time.   Allergy Studies: none      Salvatore Marvel, MD  [facility] [place] [facility] [place]

## 2019-03-23 ENCOUNTER — Telehealth: Payer: Self-pay

## 2019-03-23 NOTE — Telephone Encounter (Signed)
Victoria [name] is not covered under patient insurance. Patient had try and failed Pataday which did not work. Advise patient a second medication is required for insurance to approve. Advise to try Zaditor or Alaway OTC to see if they help for at least 30 days. Patient agreed and will try later to see if Victoria [name] will be covered at a later time.

## 2019-03-24 NOTE — Telephone Encounter (Signed)
Noted and agree with the plan.  Saida Lonon, MD [facility]  

## 2019-04-28 ENCOUNTER — Ambulatory Visit: Payer: 59 | Admitting: Obstetrics and Gynecology

## 2019-05-03 ENCOUNTER — Other Ambulatory Visit: Payer: Self-pay

## 2019-05-03 DIAGNOSIS — Z20822 Contact with and (suspected) exposure to covid-19: Secondary | ICD-10-CM

## 2019-05-05 ENCOUNTER — Telehealth: Payer: Self-pay | Admitting: Allergy & Immunology

## 2019-05-05 LAB — NOVEL CORONAVIRUS, NAA: SARS-CoV-2, NAA: DETECTED — AB

## 2019-05-05 NOTE — Telephone Encounter (Signed)
Pt called and she said that she has a sinus infection and her face hurts and head is all congested. [phone]// [facility] [address]

## 2019-05-05 NOTE — Telephone Encounter (Signed)
Dr. Gallagher any recommendations.  

## 2019-05-06 ENCOUNTER — Encounter: Payer: Self-pay | Admitting: Allergy & Immunology

## 2019-05-06 ENCOUNTER — Ambulatory Visit (INDEPENDENT_AMBULATORY_CARE_PROVIDER_SITE_OTHER): Payer: 59 | Admitting: Allergy & Immunology

## 2019-05-06 DIAGNOSIS — T7800XD Anaphylactic reaction due to unspecified food, subsequent encounter: Secondary | ICD-10-CM | POA: Diagnosis not present

## 2019-05-06 DIAGNOSIS — J984 Other disorders of lung: Secondary | ICD-10-CM

## 2019-05-06 DIAGNOSIS — J4531 Mild persistent asthma with (acute) exacerbation: Secondary | ICD-10-CM | POA: Diagnosis not present

## 2019-05-06 DIAGNOSIS — U071 COVID-19: Secondary | ICD-10-CM | POA: Diagnosis not present

## 2019-05-06 MED ORDER — PREDNISONE 10 MG PO TABS: ORAL_TABLET | ORAL | 0 refills | Status: DC

## 2019-05-06 NOTE — Telephone Encounter (Signed)
Let's do a quick televisit today.  Victoria Marvel, MD [facility] [place] [facility] [place]

## 2019-05-06 NOTE — Telephone Encounter (Signed)
Scheduled for today 4:00

## 2019-05-06 NOTE — Telephone Encounter (Signed)
Patient for today.

## 2019-05-06 NOTE — Progress Notes (Signed)
RE: Victoria [name] MRN: [id number] DOB: 1960-10-19 Date of Telemedicine Visit: 05/06/2019  Referring provider: No ref. provider found Primary care provider: Patient, No Pcp Per  Chief Complaint: Allergies (Severe nasal congestion )   Telemedicine Follow Up Visit via Telephone: I connected with Victoria [name] for a follow up on 05/09/19 by telephone and verified that I am speaking with the correct person using two identifiers.   I discussed the limitations, risks, security and privacy concerns of performing an evaluation and management service by telephone and the availability of in person appointments. I also discussed with the patient that there may be a patient responsible charge related to this service. The patient expressed understanding and agreed to proceed.  Patient is at work.  Provider is at the office.  Visit start time: 4:04 PM Visit end time: 4:21 PM Insurance consent/check in by: [facility] consent and medical assistant/nurse: Olivia Mackie  History of Present Illness:  Victoria [name] is a 59 y.o. female presenting for a sick visit.  She was last seen in September 2020.  At that time, her lung testing looked great.  We continued with Singulair 10 mg daily. For her P/SAR, we continued with a nasal steroid Flonase and Astelin up to two sprays per nostril once daily. We stopped her Pataday and started Pazeo instead. We also gave her samples of Zerviate. For her food allergies, we continued with avoidance of shellfish for now.  Since the last visit, she has mostly done well. She reports that he had head pressure on the front of her head, bilaterally, and around her nasal area. She has had symptoms for a period of 3-4 days. She started feeling bad on Thursday evening when she was caught in the rain. She had some sore throat and now she is positive for COVID. She was tested on Monday or Tuesday. She thinks that she got it from her niece on Saturday evening. She apparently tested positive on  Monday and then Victoria [name] tested positive on Tuesday. She is having some cough now, but it was worse earlier. She is able to smell somewhat and las lost of sense of taste as well.   She does report some SOB. But she remains on her Singulair daily. She has not tried Mucinex and she started that yesterday. She has been using her Proventil, which she started today. This is helping her somewhat. She is planning to stay in her house and isolated for a couple of weeks. She is doing fairly well, all things considered. Her niece is five months pregnant and has COVID19  Otherwise, there have been no changes to her past medical history, surgical history, family history, or social history.  Assessment and Plan:  Victoria [name] is a 58 y.o. female with:  Mild persistent asthma, uncomplicated  Seasonal and perennial allergic rhinitis(grasses, weeds, trees, molds, dust mite, cat, and cockroach)  Anaphylactic shock due to food (shellfish)  Restrictive airway disease with stable spirometry in the past  Recent diagnosis with COVID19     Victoria [name] presents for a sick visit over the telephone.  It turns out that she just received her COVID-19 diagnosis today.  She evidently caught her from her niece.  Right now, she is feeling somewhat stable although she does report some difficulty with smelling and tasting as well as some nasal congestion.  She does also report some worsening breathing issues.  She has been using her rescue inhaler more often.  Because of this, we are going to send in a  course of prednisone to help with controlling inflammation.  She is going to update [place] next week on her progress.  Diagnostics: None.  Medication List:  Current Outpatient Medications  Medication Sig Dispense Refill   albuterol (PROVENTIL HFA) 108 (90 Base) MCG/ACT inhaler Inhale two puffs every 4-6 hours if needed for cough or wheeze 18 g 1   azelastine (ASTELIN) 0.1 % nasal spray Place 2 sprays into both nostrils 2 (two)  times daily. Use in each nostril as directed 30 mL 5   cetirizine (ZYRTEC) 10 MG tablet Take 10 mg by mouth as needed for allergies.      diphenhydrAMINE (BENADRYL) 25 MG tablet Take 25 mg by mouth at bedtime as needed for allergies or sleep.      docusate sodium (STOOL SOFTENER) 100 MG capsule Take 100 mg by mouth daily.     EPINEPHrine (AUVI-Q) 0.3 mg/0.3 mL IJ SOAJ injection Use as directed for severe allergic reaction 2 each 2   fluticasone (FLONASE) 50 MCG/ACT nasal spray Place 2 sprays into both nostrils daily as needed for allergies. 16 g 5   hydrochlorothiazide (HYDRODIURIL) 25 MG tablet TAKE 1 TABLET DAILY 90 tablet 0   montelukast (SINGULAIR) 10 MG tablet Take 1 tablet at bedtime. 90 tablet 3   Olopatadine HCl (PAZEO) 0.7 % SOLN Place 1 drop into both eyes 1 day or 1 dose. 2.5 mL 5   polyethylene glycol (MIRALAX / GLYCOLAX) packet Take 17 g by mouth daily.     levocetirizine (XYZAL) 5 MG tablet Take 1 tablet (5 mg total) by mouth every evening. 30 tablet 5   predniSONE (DELTASONE) 10 MG tablet Take 3 tabs (30mg ) twice daily for 3 days, then 2 tabs (20mg ) twice daily for 3 days, then 1 tab (10mg ) twice daily for 3 days, then STOP. 36 tablet 0   No current facility-administered medications for this visit.    Allergies: Allergies  Allergen Reactions   Shellfish Allergy Shortness Of Breath and Itching   Sulfonamide Derivatives Itching, Swelling and Other (See Comments)    Throat swells   Nasonex [Mometasone Furoate] Other (See Comments)    Nose bleeds   I reviewed her past medical history, social history, family history, and environmental history and no significant changes have been reported from previous visits.  Review of Systems  Constitutional: Negative for activity change, appetite change, chills, fatigue and fever.  HENT: Positive for congestion. Negative for postnasal drip, rhinorrhea, sinus pressure, sore throat and voice change.   Eyes: Negative for pain,  discharge, redness and itching.  Respiratory: Positive for cough and shortness of breath. Negative for wheezing and stridor.   Gastrointestinal: Negative for diarrhea, nausea and vomiting.  Endocrine: Negative for cold intolerance and heat intolerance.  Musculoskeletal: Negative for arthralgias, joint swelling and myalgias.  Skin: Negative for rash.  Allergic/Immunologic: Negative for environmental allergies and food allergies.    Objective:  Physical exam not obtained as encounter was done via telephone.   Previous notes and tests were reviewed.  I discussed the assessment and treatment plan with the patient. The patient was provided an opportunity to ask questions and all were answered. The patient agreed with the plan and demonstrated an understanding of the instructions.   The patient was advised to call back or seek an in-person evaluation if the symptoms worsen or if the condition fails to improve as anticipated.  I provided 17 minutes of non-face-to-face time during this encounter.  It was my pleasure to participate in Victoria [name]'s care today. Please feel free to contact me with any questions or concerns.   Sincerely,  Valentina Shaggy, MD

## 2019-05-06 NOTE — Telephone Encounter (Signed)
Will route to [facility] to have her call to schedule for televisit. t  Salvatore Marvel, MD [facility] [place] [facility]

## 2019-05-09 ENCOUNTER — Encounter: Payer: Self-pay | Admitting: Allergy & Immunology

## 2019-05-10 ENCOUNTER — Telehealth: Payer: Self-pay | Admitting: Allergy & Immunology

## 2019-05-10 NOTE — Telephone Encounter (Signed)
That is fantastic. Thank you for the update!   Salvatore Marvel, MD [facility] [place] [facility] [place]

## 2019-05-10 NOTE — Telephone Encounter (Signed)
Pt called to let you know she is doing better. [phone].

## 2019-06-06 ENCOUNTER — Other Ambulatory Visit: Payer: Self-pay

## 2019-06-06 ENCOUNTER — Other Ambulatory Visit: Payer: Self-pay | Admitting: Family Medicine

## 2019-06-07 NOTE — Progress Notes (Signed)
58 y.o. [id number] Married [place] American female here for annual exam.    Denies vaginal bleeding or bladder problems.  She has some long term abdominal pain and has seen an allergist/asthma specialist.  No recommendation given per patient. She states she eats a lot of carbs.   Takes MVI.   7 grandhchildren.   Working from home.  PCP:  None   Patient's last menstrual period was 03/30/2012.           Sexually active: Yes.    The current method of family planning is post menopausal status.    Exercising: No.  The patient does not participate in regular exercise at present. Smoker:  no  Health Maintenance: Pap:04-16-17 Neg:Neg HR HPV, 12-12-13 Neg:Neg HR HPV, 06-29-12 Neg History of abnormal Pap:  Yes,  Hx LGSIL in 2010 MMG: 07-05-18 Neg/density B/BiRads1 Colonoscopy:  08/2012 normal;next 10 years BMD:   n/a  Result  n/a TDaP:  2011 Gardasil:   n/a [id number].  Probably tested in pregnancy.  Hep C: 10-21-16 Neg Screening Labs:  PCP. Flu vaccine:  Completed.    reports that she has never smoked. She has never used smokeless tobacco. She reports that she does not drink alcohol or use drugs.  Past Medical History:  Diagnosis Date  . Abnormal Pap smear of cervix   . Allergic rhinitis   . 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 Asthma   . GERD (gastroesophageal reflux disease)   . Headache(784.0)    tension- not an issue  . Hypertension     Past Surgical History:  Procedure Laterality Date  . BALLOON DILATION N/A 11/19/2015   Procedure: BALLOON DILATION;  Surgeon: Garlan Fair, MD;  Location: [facility];  Service: Endoscopy;  Laterality: N/A;  . BUNIONECTOMY Bilateral   . COLONOSCOPY WITH PROPOFOL N/A 10/05/2012   Procedure: COLONOSCOPY WITH PROPOFOL;  Surgeon: Garlan Fair, MD;  Location: [facility];  Service: Endoscopy;  Laterality: N/A;  . ESOPHAGOGASTRODUODENOSCOPY (EGD) WITH PROPOFOL N/A 11/19/2015   Procedure: ESOPHAGOGASTRODUODENOSCOPY (EGD) WITH PROPOFOL;  Surgeon:  Garlan Fair, MD;  Location: [facility];  Service: Endoscopy;  Laterality: N/A;  . TUBAL LIGATION  27 years ago    Current Outpatient Medications  Medication Sig Dispense Refill  . hydrochlorothiazide (HYDRODIURIL) 25 MG tablet TAKE 1 TABLET DAILY (NEED APPOINTMENT WITH NEW PRIMARY CARE PHYSICIAN) 90 tablet 0  . albuterol (PROVENTIL HFA) 108 (90 Base) MCG/ACT inhaler Inhale two puffs every 4-6 hours if needed for cough or wheeze 18 g 1  . azelastine (ASTELIN) 0.1 % nasal spray Place 2 sprays into both nostrils 2 (two) times daily. Use in each nostril as directed 30 mL 5  . cetirizine (ZYRTEC) 10 MG tablet Take 10 mg by mouth as needed for allergies.     . diphenhydrAMINE (BENADRYL) 25 MG tablet Take 25 mg by mouth at bedtime as needed for allergies or sleep.     Marland Kitchen docusate sodium (STOOL SOFTENER) 100 MG capsule Take 100 mg by mouth daily.    Marland Kitchen EPINEPHrine (AUVI-Q) 0.3 mg/0.3 mL IJ SOAJ injection Use as directed for severe allergic reaction 2 each 2  . fluticasone (FLONASE) 50 MCG/ACT nasal spray Place 2 sprays into both nostrils daily as needed for allergies. 16 g 5  . montelukast (SINGULAIR) 10 MG tablet Take 1 tablet at bedtime. 90 tablet 3  . polyethylene glycol (MIRALAX / GLYCOLAX) packet Take 17 g by mouth daily.     No current facility-administered medications for this  visit.     Family History  Problem Relation Age of Onset  . Emphysema Father   . Lung cancer Father   . High Cholesterol Mother   . Osteoporosis Mother   . Hypertension Mother   . Migraines Daughter   . Hypertension Son        exercise  . Asthma Son   . Hypertension Sister   . COPD Other   . Breast cancer Neg Hx   . Allergic rhinitis Neg Hx   . Angioedema Neg Hx   . Atopy Neg Hx   . Eczema Neg Hx   . Immunodeficiency Neg Hx   . Urticaria Neg Hx     Review of Systems  All other systems reviewed and are negative.   Exam:   BP 122/76 (Cuff Size: Large)   Pulse 66   Temp (!) 97.1 F (36.2 C)  (Temporal)   Resp 14   Ht 5\' 4"  (1.626 m)   Wt 193 lb (87.5 kg)   LMP 03/30/2012   BMI 33.13 kg/m     General appearance: alert, cooperative and appears stated age Head: normocephalic, without obvious abnormality, atraumatic Neck: no adenopathy, supple, symmetrical, trachea midline and thyroid normal to inspection and palpation Lungs: clear to auscultation bilaterally Breasts: normal appearance, no masses or tenderness, No nipple retraction or dimpling, No nipple discharge or bleeding, No axillary adenopathy Heart: regular rate and rhythm Abdomen: soft, non-tender; no masses, no organomegaly Extremities: extremities normal, atraumatic, no cyanosis or edema Skin: skin color, texture, turgor normal. No rashes or lesions Lymph nodes: cervical, supraclavicular, and axillary nodes normal. Neurologic: grossly normal  Pelvic: External genitalia:  no lesions              No abnormal inguinal nodes palpated.              Urethra:  normal appearing urethra with no masses, tenderness or lesions              Bartholins and Skenes: normal                 Vagina: normal appearing vagina with normal color and some white clumpy discharge, no lesions (no symptoms, so no evaluation done.)              Cervix: no lesions              Pap taken: No. Bimanual Exam:  Uterus:  normal size, contour, position, consistency, mobility, non-tender              Adnexa: no mass, fullness, tenderness              Rectal exam: Yes.  .  Confirms.              Anus:  normal sphincter tone, no lesions  Chaperone was present for exam.  Assessment:   Well woman visit with normal exam. Hx LGSIL.  Prediabetic. Abdominal discomfort.  Plan: Mammogram screening discussed.  She will schedule for Jan. 2021. Self breast awareness reviewed. Pap and HR HPV in 2023.  New guidelines discussed. Guidelines for Calcium, Vitamin D, regular exercise program including cardiovascular and weight bearing exercise. We talked about  increasing fiber, protein, and probiotics in her diet and increasing exercise to help with her intestinal function.  List of PCPs to patient.  Follow up annually and prn.   After visit summary provided.

## 2019-06-08 ENCOUNTER — Other Ambulatory Visit: Payer: Self-pay

## 2019-06-08 ENCOUNTER — Ambulatory Visit (INDEPENDENT_AMBULATORY_CARE_PROVIDER_SITE_OTHER): Payer: 59 | Admitting: Obstetrics and Gynecology

## 2019-06-08 ENCOUNTER — Encounter: Payer: Self-pay | Admitting: Obstetrics and Gynecology

## 2019-06-08 VITALS — BP 122/76 | HR 66 | Temp 97.1°F | Resp 14 | Ht 64.0 in | Wt 193.0 lb

## 2019-06-08 DIAGNOSIS — Z01419 Encounter for gynecological examination (general) (routine) without abnormal findings: Secondary | ICD-10-CM

## 2019-06-08 NOTE — Patient Instructions (Signed)

## 2019-06-09 ENCOUNTER — Other Ambulatory Visit: Payer: Self-pay | Admitting: Obstetrics and Gynecology

## 2019-06-09 DIAGNOSIS — Z1231 Encounter for screening mammogram for malignant neoplasm of breast: Secondary | ICD-10-CM

## 2019-07-28 ENCOUNTER — Ambulatory Visit
Admission: RE | Admit: 2019-07-28 | Discharge: 2019-07-28 | Disposition: A | Discharge status: Home / Self Care | Payer: 59 | Source: Ambulatory Visit | Attending: Obstetrics and Gynecology | Admitting: Obstetrics and Gynecology

## 2019-07-28 ENCOUNTER — Other Ambulatory Visit: Payer: Self-pay

## 2019-07-28 DIAGNOSIS — Z1231 Encounter for screening mammogram for malignant neoplasm of breast: Secondary | ICD-10-CM

## 2019-09-06 ENCOUNTER — Encounter: Payer: Self-pay | Admitting: Allergy & Immunology

## 2019-09-06 ENCOUNTER — Other Ambulatory Visit: Payer: Self-pay

## 2019-09-06 ENCOUNTER — Ambulatory Visit (INDEPENDENT_AMBULATORY_CARE_PROVIDER_SITE_OTHER): Payer: 59 | Admitting: Allergy & Immunology

## 2019-09-06 VITALS — BP 128/78 | HR 61 | Temp 97.5°F | Resp 18 | Ht 63.0 in | Wt 194.4 lb

## 2019-09-06 DIAGNOSIS — T7800XD Anaphylactic reaction due to unspecified food, subsequent encounter: Secondary | ICD-10-CM

## 2019-09-06 DIAGNOSIS — J4530 Mild persistent asthma, uncomplicated: Secondary | ICD-10-CM | POA: Diagnosis not present

## 2019-09-06 DIAGNOSIS — J3089 Other allergic rhinitis: Secondary | ICD-10-CM

## 2019-09-06 DIAGNOSIS — J302 Other seasonal allergic rhinitis: Secondary | ICD-10-CM

## 2019-09-06 MED ORDER — ALBUTEROL SULFATE HFA 108 (90 BASE) MCG/ACT IN AERS: INHALATION_SPRAY | RESPIRATORY_TRACT | 11 refills | Status: DC

## 2019-09-06 MED ORDER — EPINEPHRINE 0.3 MG/0.3ML IJ SOAJ: 0.3000 mg | Freq: Once | INTRAMUSCULAR | 1 refills | Status: AC

## 2019-09-06 MED ORDER — AZELASTINE HCL 0.1 % NA SOLN: 2.0000 | Freq: Two times a day (BID) | NASAL | 5 refills | Status: DC

## 2019-09-06 MED ORDER — MONTELUKAST SODIUM 10 MG PO TABS: ORAL_TABLET | ORAL | 3 refills | Status: DC

## 2019-09-06 MED ORDER — FLUTICASONE PROPIONATE 50 MCG/ACT NA SUSP: 2.0000 | Freq: Every day | NASAL | 3 refills | Status: DC | PRN

## 2019-09-06 NOTE — Progress Notes (Signed)
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  She is doing very well and did recover from her COVID-19 diagnosis at from the last visit.  She continues to have a stable restrictive pattern on her spirometry.  Her asthma symptoms are controlled with Singulair alone.  Therefore, I think we can see her on an annual basis instead of every 6 months.  Clinically she has been very stable.   Plan/Recommendations:   1. Mild persistent asthma, uncomplicated - Lung testing looked stable.  - Continue with Singulair 10mg  daily. - We will continue with Proventil 4 puffs every 4-6 hours as needed for coughing/wheezing.  2. Chronic allergic rhinitis (grasses, weeds, trees, molds, dust mite, cat, and cockroach) - Continue with a nasal steroid spray such as Flonase and Azelastine up to 2 sprays per nostril once daily. - Continue with your OTC eye drop.    3. Adverse food reaction (shellfish)  - We will send in a new prescription for AuviQ.  - Continue to avoid shellfish for now.  4. Return in about 1 year (around 09/05/2020). This can be an in-person, a virtual Webex or a telephone follow up visit.  Subjective:   Victoria [name] is a 59 y.o. female presenting today for follow up of  Chief Complaint  Patient presents with  . Follow-up  . Asthma  . Allergies    Victoria [name] has a history of the following: Patient Active Problem List   Diagnosis Date Noted  . Mild persistent asthma with acute exacerbation 05/31/2018  . Adverse food reaction 05/31/2018  . Mild persistent asthma, uncomplicated 0000000  . Restrictive  airway disease 11/12/2017  . Anaphylactic shock due to adverse food reaction 11/12/2017  . Chronic seasonal allergic rhinitis due to fungal spores 11/12/2017  . Class 1 obesity without serious comorbidity with body mass index (BMI) of 31.0 to 31.9 in adult 10/21/2016  . Chronic constipation 10/21/2016  . Seasonal and perennial allergic rhinitis 10/05/2007  . Asthma, mild intermittent, well-controlled 10/05/2007    History obtained from: chart review and patient.  Victoria [name] is a 59 y.o. female presenting for a follow up visit. She was last seen in November 2020. At that time, she had a diagnosis of [place].  She was actually doing well with this with some nasal congestion and difficulty smelling and tasting.  Her breathing was fairly stable although she was using albuterol more regularly.  Since last visit, she has done well.  Asthma/Respiratory Symptom History: She remains on the Singulair 10 mg daily.  She does have the albuterol to use for episodes of wheezing or shortness of breath.  She has not needed this at all.  ACT score is 25, indicating excellent asthma control.  She has not been to the hospital and has not required prednisone.  Allergic Rhinitis Symptom History: She remains on nasal steroid as well as Astelin.  She does not use these on a routine basis.  She does use an OTC eyedrop.  Her insurance stopped covering all the prescription eyedrops that we had provided, so she just gets what ever is on sale at the store.  This is working somewhat well.  Food Allergy  Symptom History: She continues to avoid shellfish.  She is not interested in repeat testing.  She is requesting a refill on her Auvi-Q.  She continues to work remotely.  Prior to this, she was working downtown across from the [employer]  Her employer is not requiring anyone to come back to work yet. Otherwise, there have been no changes to her past medical history, surgical history, family history, or social  history.    Review of Systems  Constitutional: Negative.  Negative for chills, fever, malaise/fatigue and weight loss.  HENT: Negative.  Negative for congestion, ear discharge and ear pain.   Eyes: Negative for pain, discharge and redness.  Respiratory: Negative for cough, sputum production, shortness of breath and wheezing.   Cardiovascular: Negative.  Negative for chest pain and palpitations.  Gastrointestinal: Negative for abdominal pain, constipation, diarrhea, heartburn, nausea and vomiting.  Skin: Negative.  Negative for itching and rash.  Neurological: Negative for dizziness and headaches.  Endo/Heme/Allergies: Negative for environmental allergies. Does not bruise/bleed easily.       Objective:   Blood pressure 128/78, pulse 61, temperature (!) 97.5 F (36.4 C), temperature source Temporal, resp. rate 18, height 5\' 3"  (1.6 m), weight 194 lb 6.4 oz (88.2 kg), last menstrual period 03/30/2012, SpO2 98 %. Body mass index is 34.44 kg/m.   Physical Exam:  Physical Exam  Constitutional: She appears well-developed.  Smiling.  Pleasant.  HENT:  Head: Normocephalic and atraumatic.  Right Ear: Tympanic membrane, external ear and ear canal normal.  Left Ear: Tympanic membrane, external ear and ear canal normal.  Nose: Mucosal edema and rhinorrhea present. No nasal deformity or septal deviation. No epistaxis. Right sinus exhibits no maxillary sinus tenderness and no frontal sinus tenderness. Left sinus exhibits no maxillary sinus tenderness and no frontal sinus tenderness.  Mouth/Throat: Uvula is midline and oropharynx is clear and moist. Mucous membranes are not pale and not dry.  Tonsils enlarged bilaterally without discharge.  Eyes: Pupils are equal, round, and reactive to light. Conjunctivae and EOM are normal. Right eye exhibits no chemosis and no discharge. Left eye exhibits no chemosis and no discharge. Right conjunctiva is not injected. Left conjunctiva is not injected.   Cardiovascular: Normal rate, regular rhythm and normal heart sounds.  Respiratory: Effort normal and breath sounds normal. No accessory muscle usage. No tachypnea. No respiratory distress. She has no wheezes. She has no rhonchi. She has no rales. She exhibits no tenderness.  Moving air well in all lung fields.  No increased work of breathing.  Lymphadenopathy:    She has no cervical adenopathy.  Neurological: She is alert.  Skin: No abrasion, no petechiae and no rash noted. Rash is not papular, not vesicular and not urticarial. No erythema. No pallor.  Psychiatric: She has a normal mood and affect.     Diagnostic studies:    Spirometry: results abnormal (FEV1: 1.10/53%, FVC: 1.72/66%, FEV1/FVC: 64%).    Spirometry consistent with mixed obstructive and restrictive disease.  Overall values are stable compared to previous spirometry.  Allergy Studies:          Salvatore Marvel, MD  [facility]

## 2019-09-06 NOTE — Patient Instructions (Addendum)
1. Mild persistent asthma, uncomplicated - Lung testing looked stable.  - Continue with Singulair 10mg  daily. - We will continue with Proventil 4 puffs every 4-6 hours as needed for coughing/wheezing.  2. Chronic allergic rhinitis (grasses, weeds, trees, molds, dust mite, cat, and cockroach) - Continue with a nasal steroid spray such as Flonase and Azelastine up to 2 sprays per nostril once daily. - Continue with your OTC eye drop.    3. Adverse food reaction (shellfish)  - We will send in a new prescription for AuviQ.  - Continue to avoid shellfish for now.  4. Return in about 1 year (around 09/05/2020). This can be an in-person, a virtual Webex or a telephone follow up visit.   Please inform us of any Emergency Department visits, hospitalizations, or changes in symptoms. Call us before going to the ED for breathing or allergy symptoms since we might be able to fit you in for a sick visit. Feel free to contact us anytime with any questions, problems, or concerns.  It was a pleasure to see you again today!  Websites that have reliable patient information: 1. American Academy of Asthma, Allergy, and Immunology: www.aaaai.org 2. Food Allergy Research and Education (FARE): foodallergy.org 3. Mothers of Asthmatics: http://www.asthmacommunitynetwork.org 4. American College of Allergy, Asthma, and Immunology: www.acaai.org   COVID-19 Vaccine Information can be found at: ShippingScam.co.uk For questions related to vaccine distribution or appointments, please email vaccine@[email or web address] [email or web address] or call [phone].     "Like" [place] on Facebook and Instagram for our latest updates!        Make sure you are registered to vote! If you have moved or changed any of your contact information, you will need to get this updated before voting!  In some cases, you MAY be able to register to vote online:  [email or web address]

## 2019-11-03 ENCOUNTER — Other Ambulatory Visit: Payer: Self-pay

## 2019-11-04 ENCOUNTER — Encounter: Payer: Self-pay | Admitting: Internal Medicine

## 2019-11-04 ENCOUNTER — Ambulatory Visit (INDEPENDENT_AMBULATORY_CARE_PROVIDER_SITE_OTHER): Payer: 59 | Admitting: Internal Medicine

## 2019-11-04 VITALS — BP 110/80 | HR 61 | Temp 97.7°F | Ht 64.0 in | Wt 185.5 lb

## 2019-11-04 DIAGNOSIS — I10 Essential (primary) hypertension: Secondary | ICD-10-CM

## 2019-11-04 DIAGNOSIS — E669 Obesity, unspecified: Secondary | ICD-10-CM | POA: Diagnosis not present

## 2019-11-04 DIAGNOSIS — Z6831 Body mass index (BMI) 31.0-31.9, adult: Secondary | ICD-10-CM

## 2019-11-04 DIAGNOSIS — E119 Type 2 diabetes mellitus without complications: Secondary | ICD-10-CM | POA: Insufficient documentation

## 2019-11-04 DIAGNOSIS — J4520 Mild intermittent asthma, uncomplicated: Secondary | ICD-10-CM

## 2019-11-04 DIAGNOSIS — R7302 Impaired glucose tolerance (oral): Secondary | ICD-10-CM | POA: Diagnosis not present

## 2019-11-04 HISTORY — DX: Impaired glucose tolerance (oral): R73.02

## 2019-11-04 MED ORDER — HYDROCHLOROTHIAZIDE 25 MG PO TABS: ORAL_TABLET | ORAL | 1 refills | Status: DC

## 2019-11-04 NOTE — Patient Instructions (Signed)
-  Nice seeing you today!!  -Schedule a follow up in 3 months. No need to fast.

## 2019-11-04 NOTE — Progress Notes (Signed)
Established Patient Office Visit     This visit occurred during the SARS-CoV-2 public health emergency.  Safety protocols were in place, including screening questions prior to the visit, additional usage of staff PPE, and extensive cleaning of exam room while observing appropriate contact time as indicated for disinfecting solutions.    CC/Reason for Visit: Establish care, discuss chronic conditions.  HPI: Victoria [name] is a 59 y.o. female who is coming in today for the above mentioned reasons. Past Medical History is significant for: Hypertension, obesity and she was also recently diagnosed with impaired glucose tolerance with an A1c of 6.3 in March.  She has no acute complaints today.  She is wondering whether she truly needs to take valsartan that was prescribed to her when her blood pressure has been normal.  She was also given empagliflozin to take for her impaired glucose tolerance and is wondering whether she should continue this.  She has no acute complaints today.   Past Medical/Surgical History: Past Medical History:  Diagnosis Date  . Abnormal Pap smear of cervix   . Allergic rhinitis   . 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 Asthma   . GERD (gastroesophageal reflux disease)   . Headache(784.0)    tension- not an issue  . Hypertension   . IGT (impaired glucose tolerance) 11/04/2019  . Urticaria     Past Surgical History:  Procedure Laterality Date  . BALLOON DILATION N/A 11/19/2015   Procedure: BALLOON DILATION;  Surgeon: Garlan Fair, MD;  Location: [facility];  Service: Endoscopy;  Laterality: N/A;  . BUNIONECTOMY Bilateral   . COLONOSCOPY WITH PROPOFOL N/A 10/05/2012   Procedure: COLONOSCOPY WITH PROPOFOL;  Surgeon: Garlan Fair, MD;  Location: [facility];  Service: Endoscopy;  Laterality: N/A;  . ESOPHAGOGASTRODUODENOSCOPY (EGD) WITH PROPOFOL N/A 11/19/2015   Procedure: ESOPHAGOGASTRODUODENOSCOPY (EGD) WITH PROPOFOL;  Surgeon: Garlan Fair, MD;   Location: [facility];  Service: Endoscopy;  Laterality: N/A;  . TUBAL LIGATION  27 years ago    Social History:  reports that she has never smoked. She has never used smokeless tobacco. She reports that she does not drink alcohol or use drugs.  Allergies: Allergies  Allergen Reactions  . Shellfish Allergy Shortness Of Breath and Itching  . Sulfonamide Derivatives Itching, Swelling and Other (See Comments)    Throat swells  . Nasonex [Mometasone Furoate] Other (See Comments)    Nose bleeds    Family History:  Family History  Problem Relation Age of Onset  . Emphysema Father   . Lung cancer Father   . High Cholesterol Mother   . Osteoporosis Mother   . Hypertension Mother   . Migraines Daughter   . Hypertension Son        exercise  . Asthma Son   . Hypertension Sister   . COPD Other   . Breast cancer Neg Hx   . Allergic rhinitis Neg Hx   . Angioedema Neg Hx   . Atopy Neg Hx   . Eczema Neg Hx   . Immunodeficiency Neg Hx   . Urticaria Neg Hx      Current Outpatient Medications:  .  albuterol (PROVENTIL HFA) 108 (90 Base) MCG/ACT inhaler, Inhale two puffs every 4-6 hours if needed for cough or wheeze, Disp: 18 g, Rfl: 11 .  azelastine (ASTELIN) 0.1 % nasal spray, Place 2 sprays into both nostrils 2 (two) times daily. Use in each nostril as directed, Disp: 30 mL, Rfl: 5 .  cetirizine (ZYRTEC) 10 MG tablet, Take 10 mg by mouth as needed for allergies. , Disp: , Rfl:  .  cholecalciferol (VITAMIN D3) 25 MCG (1000 UNIT) tablet, Take 1,000 Units by mouth daily., Disp: , Rfl:  .  diphenhydrAMINE (BENADRYL) 25 MG tablet, Take 25 mg by mouth at bedtime as needed for allergies or sleep. , Disp: , Rfl:  .  docusate sodium (STOOL SOFTENER) 100 MG capsule, Take 100 mg by mouth daily., Disp: , Rfl:  .  EPINEPHrine (AUVI-Q) 0.3 mg/0.3 mL IJ SOAJ injection, Use as directed for severe allergic reaction, Disp: 2 each, Rfl: 2 .  fluticasone (FLONASE) 50 MCG/ACT nasal spray, Place 2 sprays  into both nostrils daily as needed for allergies., Disp: 16 g, Rfl: 3 .  hydrochlorothiazide (HYDRODIURIL) 25 MG tablet, Take 1 tablet daily, Disp: 90 tablet, Rfl: 1 .  montelukast (SINGULAIR) 10 MG tablet, Take 1 tablet at bedtime., Disp: 90 tablet, Rfl: 3 .  Multiple Vitamin (MULTIVITAMIN) tablet, Take 1 tablet by mouth daily., Disp: , Rfl:  .  polyethylene glycol (MIRALAX / GLYCOLAX) packet, Take 17 g by mouth daily., Disp: , Rfl:   Review of Systems:  Constitutional: Denies fever, chills, diaphoresis, appetite change and fatigue.  HEENT: Denies photophobia, eye pain, redness, hearing loss, ear pain, congestion, sore throat, rhinorrhea, sneezing, mouth sores, trouble swallowing, neck pain, neck stiffness and tinnitus.   Respiratory: Denies SOB, DOE, cough, chest tightness,  and wheezing.   Cardiovascular: Denies chest pain, palpitations and leg swelling.  Gastrointestinal: Denies nausea, vomiting, abdominal pain, diarrhea, constipation, blood in stool and abdominal distention.  Genitourinary: Denies dysuria, urgency, frequency, hematuria, flank pain and difficulty urinating.  Endocrine: Denies: hot or cold intolerance, sweats, changes in hair or nails, polyuria, polydipsia. Musculoskeletal: Denies myalgias, back pain, joint swelling, arthralgias and gait problem.  Skin: Denies pallor, rash and wound.  Neurological: Denies dizziness, seizures, syncope, weakness, light-headedness, numbness and headaches.  Hematological: Denies adenopathy. Easy bruising, personal or family bleeding history  Psychiatric/Behavioral: Denies suicidal ideation, mood changes, confusion, nervousness, sleep disturbance and agitation    Physical Exam: Vitals:   11/04/19 1423  BP: 110/80  Pulse: 61  Temp: 97.7 F (36.5 C)  TempSrc: Temporal  SpO2: 94%  Weight: 185 lb 8 oz (84.1 kg)  Height: 5\' 4"  (1.626 m)    Body mass index is 31.84 kg/m.   Constitutional: NAD, calm, comfortable Eyes: PERRL, lids and  conjunctivae normal, wears corrective lenses ENMT: Mucous membranes are moist.  Respiratory: clear to auscultation bilaterally, no wheezing, no crackles. Normal respiratory effort. No accessory muscle use.  Cardiovascular: Regular rate and rhythm, no murmurs / rubs / gallops. No extremity edema.  Neurologic: Grossly intact and nonfocal Psychiatric: Normal judgment and insight. Alert and oriented x 3. Normal mood.    Impression and Plan:  Asthma, mild intermittent, well-controlled -Is on Singulair, Flonase and uses albuterol as needed.  At this point she mostly has exercise-induced asthma.  Class 1 obesity without serious comorbidity with body mass index (BMI) of 31.0 to 31.9 in adult, unspecified obesity type -Discussed healthy lifestyle, including increased physical activity and better food choices to promote weight loss.  Essential hypertension  -Well-controlled, refill hydrochlorothiazide. -No need for valsartan at this time.  IGT (impaired glucose tolerance) -A1c was 6.3 in March. -Lifestyle modifications advised.  - Follow-up in 3 months.    Patient Instructions  -Nice seeing you today!!  -Schedule a follow up in 3 months. No need to fast.  Estela Hernandez Acosta, MD [facility]   

## 2019-11-15 LAB — HM DIABETES EYE EXAM

## 2019-12-20 ENCOUNTER — Encounter: Payer: Self-pay | Admitting: Internal Medicine

## 2020-01-30 ENCOUNTER — Telehealth: Payer: Self-pay | Admitting: Allergy & Immunology

## 2020-01-30 NOTE — Telephone Encounter (Signed)
Symptoms started last week as a scratchy throat. She does want noted that she has been fully vaccinated. I did inform her of the medication recommendations.

## 2020-01-30 NOTE — Telephone Encounter (Signed)
Patient called and said that her head is stop up and hurting. She had some sore throat no fever and nose is clear. [facility]. [phone].

## 2020-01-30 NOTE — Telephone Encounter (Signed)
I would definitely start Muxinex 1200mg  BID with adequate hydration. She can also use antipyretics (Tylenol or ibuprofen). I would also strongly consider COVID19 testing.   Can we confirm a time line of the symptoms? How long have they been going on?  Salvatore Marvel, MD [facility]

## 2020-01-30 NOTE — Telephone Encounter (Signed)
Severe head congestion despite taking sudafed. Headache. Sore throat is easing off some with use of the cough drops with sore throat relief. Some runny nose with clear drainage. Tiny bit of cough but it is nonproductive. Some body aches and breaks out in a sweat. Please advise on further instructions and thank you.

## 2020-02-07 ENCOUNTER — Ambulatory Visit (INDEPENDENT_AMBULATORY_CARE_PROVIDER_SITE_OTHER): Payer: 59 | Admitting: Internal Medicine

## 2020-02-07 ENCOUNTER — Other Ambulatory Visit: Payer: Self-pay

## 2020-02-07 ENCOUNTER — Encounter: Payer: Self-pay | Admitting: Internal Medicine

## 2020-02-07 VITALS — BP 110/80 | HR 59 | Temp 98.7°F | Wt 171.2 lb

## 2020-02-07 DIAGNOSIS — Z6831 Body mass index (BMI) 31.0-31.9, adult: Secondary | ICD-10-CM

## 2020-02-07 DIAGNOSIS — E669 Obesity, unspecified: Secondary | ICD-10-CM

## 2020-02-07 DIAGNOSIS — J4531 Mild persistent asthma with (acute) exacerbation: Secondary | ICD-10-CM

## 2020-02-07 DIAGNOSIS — R7302 Impaired glucose tolerance (oral): Secondary | ICD-10-CM

## 2020-02-07 DIAGNOSIS — I10 Essential (primary) hypertension: Secondary | ICD-10-CM | POA: Diagnosis not present

## 2020-02-07 DIAGNOSIS — Z23 Encounter for immunization: Secondary | ICD-10-CM

## 2020-02-07 LAB — POCT GLYCOSYLATED HEMOGLOBIN (HGB A1C): Hemoglobin A1C: 5.9 % — AB (ref 4.0–5.6)

## 2020-02-07 NOTE — Addendum Note (Signed)
Addended by: Westley Hummer B on: 02/07/2020 03:33 PM   Modules accepted: Orders

## 2020-02-07 NOTE — Progress Notes (Signed)
Established Patient Office Visit     This visit occurred during the SARS-CoV-2 public health emergency.  Safety protocols were in place, including screening questions prior to the visit, additional usage of staff PPE, and extensive cleaning of exam room while observing appropriate contact time as indicated for disinfecting solutions.    CC/Reason for Visit: 35-month follow-up chronic medical conditions  HPI: Victoria [name] is a 59 y.o. female who is coming in today for the above mentioned reasons. Past Medical History is significant for:  Hypertension, obesity and she was also recently diagnosed with impaired glucose tolerance with an A1c of 6.3 in March.  She has no acute complaints today.  She has lost 15 pounds since her last visit through diet and exercise.  She is requesting her first shingles vaccine today.   Past Medical/Surgical History: Past Medical History:  Diagnosis Date  . Abnormal Pap smear of cervix   . Allergic rhinitis   . 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 Asthma   . GERD (gastroesophageal reflux disease)   . Headache(784.0)    tension- not an issue  . Hypertension   . IGT (impaired glucose tolerance) 11/04/2019  . Urticaria     Past Surgical History:  Procedure Laterality Date  . BALLOON DILATION N/A 11/19/2015   Procedure: BALLOON DILATION;  Surgeon: Garlan Fair, MD;  Location: [facility];  Service: Endoscopy;  Laterality: N/A;  . BUNIONECTOMY Bilateral   . COLONOSCOPY WITH PROPOFOL N/A 10/05/2012   Procedure: COLONOSCOPY WITH PROPOFOL;  Surgeon: Garlan Fair, MD;  Location: [facility];  Service: Endoscopy;  Laterality: N/A;  . ESOPHAGOGASTRODUODENOSCOPY (EGD) WITH PROPOFOL N/A 11/19/2015   Procedure: ESOPHAGOGASTRODUODENOSCOPY (EGD) WITH PROPOFOL;  Surgeon: Garlan Fair, MD;  Location: [facility];  Service: Endoscopy;  Laterality: N/A;  . TUBAL LIGATION  27 years ago    Social History:  reports that she has never smoked. She has  never used smokeless tobacco. She reports that she does not drink alcohol and does not use drugs.  Allergies: Allergies  Allergen Reactions  . Shellfish Allergy Shortness Of Breath and Itching  . Sulfonamide Derivatives Itching, Swelling and Other (See Comments)    Throat swells  . Nasonex [Mometasone Furoate] Other (See Comments)    Nose bleeds    Family History:  Family History  Problem Relation Age of Onset  . Emphysema Father   . Lung cancer Father   . High Cholesterol Mother   . Osteoporosis Mother   . Hypertension Mother   . Migraines Daughter   . Hypertension Son        exercise  . Asthma Son   . Hypertension Sister   . COPD Other   . Breast cancer Neg Hx   . Allergic rhinitis Neg Hx   . Angioedema Neg Hx   . Atopy Neg Hx   . Eczema Neg Hx   . Immunodeficiency Neg Hx   . Urticaria Neg Hx      Current Outpatient Medications:  .  albuterol (PROVENTIL HFA) 108 (90 Base) MCG/ACT inhaler, Inhale two puffs every 4-6 hours if needed for cough or wheeze, Disp: 18 g, Rfl: 11 .  azelastine (ASTELIN) 0.1 % nasal spray, Place 2 sprays into both nostrils 2 (two) times daily. Use in each nostril as directed, Disp: 30 mL, Rfl: 5 .  cetirizine (ZYRTEC) 10 MG tablet, Take 10 mg by mouth as needed for allergies. , Disp: , Rfl:  .  cholecalciferol (VITAMIN  D3) 25 MCG (1000 UNIT) tablet, Take 1,000 Units by mouth daily., Disp: , Rfl:  .  diphenhydrAMINE (BENADRYL) 25 MG tablet, Take 25 mg by mouth at bedtime as needed for allergies or sleep. , Disp: , Rfl:  .  docusate sodium (STOOL SOFTENER) 100 MG capsule, Take 100 mg by mouth daily., Disp: , Rfl:  .  EPINEPHrine (AUVI-Q) 0.3 mg/0.3 mL IJ SOAJ injection, Use as directed for severe allergic reaction, Disp: 2 each, Rfl: 2 .  fluticasone (FLONASE) 50 MCG/ACT nasal spray, Place 2 sprays into both nostrils daily as needed for allergies., Disp: 16 g, Rfl: 3 .  hydrochlorothiazide (HYDRODIURIL) 25 MG tablet, Take 1 tablet daily, Disp: 90  tablet, Rfl: 1 .  montelukast (SINGULAIR) 10 MG tablet, Take 1 tablet at bedtime., Disp: 90 tablet, Rfl: 3 .  Multiple Vitamin (MULTIVITAMIN) tablet, Take 1 tablet by mouth daily., Disp: , Rfl:  .  polyethylene glycol (MIRALAX / GLYCOLAX) packet, Take 17 g by mouth daily., Disp: , Rfl:   Review of Systems:  Constitutional: Denies fever, chills, diaphoresis, appetite change and fatigue.  HEENT: Denies photophobia, eye pain, redness, hearing loss, ear pain, congestion, sore throat, rhinorrhea, sneezing, mouth sores, trouble swallowing, neck pain, neck stiffness and tinnitus.   Respiratory: Denies SOB, DOE, cough, chest tightness,  and wheezing.   Cardiovascular: Denies chest pain, palpitations and leg swelling.  Gastrointestinal: Denies nausea, vomiting, abdominal pain, diarrhea, constipation, blood in stool and abdominal distention.  Genitourinary: Denies dysuria, urgency, frequency, hematuria, flank pain and difficulty urinating.  Endocrine: Denies: hot or cold intolerance, sweats, changes in hair or nails, polyuria, polydipsia. Musculoskeletal: Denies myalgias, back pain, joint swelling, arthralgias and gait problem.  Skin: Denies pallor, rash and wound.  Neurological: Denies dizziness, seizures, syncope, weakness, light-headedness, numbness and headaches.  Hematological: Denies adenopathy. Easy bruising, personal or family bleeding history  Psychiatric/Behavioral: Denies suicidal ideation, mood changes, confusion, nervousness, sleep disturbance and agitation    Physical Exam: Vitals:   02/07/20 1503  BP: 110/80  Pulse: (!) 59  Temp: 98.7 F (37.1 C)  TempSrc: Oral  SpO2: 96%  Weight: 171 lb 3.2 oz (77.7 kg)    Body mass index is 29.39 kg/m.   Constitutional: NAD, calm, comfortable Eyes: PERRL, lids and conjunctivae normal, wears corrective lenses ENMT: Mucous membranes are moist. Respiratory: clear to auscultation bilaterally, no wheezing, no crackles. Normal respiratory  effort. No accessory muscle use.  Cardiovascular: Regular rate and rhythm, no murmurs / rubs / gallops. No extremity edema. Neurologic: Grossly intact and nonfocal Psychiatric: Normal judgment and insight. Alert and oriented x 3. Normal mood.    Impression and Plan:  IGT (impaired glucose tolerance)  -A1c of 5.9 today.  Essential hypertension -Well-controlled on hydrochlorothiazide.  Mild persistent asthma with acute exacerbation -No recent flares.  Class 1 obesity without serious comorbidity with body mass index (BMI) of 31.0 to 31.9 in adult, unspecified obesity type --Discussed healthy lifestyle, including increased physical activity and better food choices to promote weight loss. -She has been congratulated on her success thus far with weight loss.   Patient Instructions  -Nice seeing you today!!  -Great job with the weight loss!  -First shingles vaccine today.  -Schedule follow up in 6 months.     Lelon Frohlich, MD [facility]

## 2020-02-07 NOTE — Patient Instructions (Signed)
-  Nice seeing you today!!  -Great job with the weight loss!  -First shingles vaccine today.  -Schedule follow up in 6 months.

## 2020-03-01 ENCOUNTER — Telehealth: Payer: Self-pay

## 2020-03-01 NOTE — Telephone Encounter (Signed)
Patient is calling in regards to spotting. Patient states she has not had a cycle in 5-6 years.

## 2020-03-01 NOTE — Telephone Encounter (Signed)
AEX 05/2019- next 05/2020 with Dr [name]. H/o Pelvic pain- 07/2573 Umbilical hernia Tension headache Hx  Spoke with pt. Pt states having vaginal spotting that started yesterday on 9/1. Pt states only when wiping noticed the spotting. Pt placed panty liner and is not seeing any on liner. Pt also having back cramps and mild headache. States has been taking OTC Tylenol which helps resolve cramps. Pt advised can continue to take as needed. Denies heavy bleeding, clots, fever, chills, UTI sx. States has not had menstrual cycle in 6 years.  Advised pt to have OV for further evaluation. Pt agreeable. Pt scheduled for first available appt on 9/7 at 245 pm. Pt declines earlier appts given and with other providers. Pt verbalized understanding of date and time of appt. Pt advised to seek ER or PCP if sx worsen. Pt agreeable.   Routing to Dr [name] for review.  Encounter closed.

## 2020-03-01 NOTE — Progress Notes (Deleted)
59 y.o. G27P2002 Married [place] American female here for annual exam.    Patient with COVID 05/2019.  PCP:     Patient's last menstrual period was 03/30/2012.           Sexually active: {yes no:314532}  The current method of family planning is post menopausal status.    Exercising: {yes no:314532}  {types:19826} Smoker:  no  Health Maintenance: Pap: 04-16-17 Neg:Neg HR HPV, 12-12-13 Neg:Neg HR HPV, 06-29-12 Neg History of abnormal Pap:  Yes, Hx LGSIL in 2010 MMG: 07-28-19 3D/Neg/density B/BiRads1 Colonoscopy: 08/2012 normal;next 10 years BMD:   ***  Result  *** TDaP: 10-21-16 Td Gardasil:   no HIV: Neg in pregnancy Hep C:10-21-16 Neg Screening Labs:  Hb today: ***, Urine today: ***   reports that she has never smoked. She has never used smokeless tobacco. She reports that she does not drink alcohol and does not use drugs.  Past Medical History:  Diagnosis Date  . Abnormal Pap smear of cervix   . Allergic rhinitis   . 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 Asthma   . GERD (gastroesophageal reflux disease)   . Headache(784.0)    tension- not an issue  . Hypertension   . IGT (impaired glucose tolerance) 11/04/2019  . Urticaria     Past Surgical History:  Procedure Laterality Date  . BALLOON DILATION N/A 11/19/2015   Procedure: BALLOON DILATION;  Surgeon: Garlan Fair, MD;  Location: [facility];  Service: Endoscopy;  Laterality: N/A;  . BUNIONECTOMY Bilateral   . COLONOSCOPY WITH PROPOFOL N/A 10/05/2012   Procedure: COLONOSCOPY WITH PROPOFOL;  Surgeon: Garlan Fair, MD;  Location: [facility];  Service: Endoscopy;  Laterality: N/A;  . ESOPHAGOGASTRODUODENOSCOPY (EGD) WITH PROPOFOL N/A 11/19/2015   Procedure: ESOPHAGOGASTRODUODENOSCOPY (EGD) WITH PROPOFOL;  Surgeon: Garlan Fair, MD;  Location: [facility];  Service: Endoscopy;  Laterality: N/A;  . TUBAL LIGATION  27 years ago    Current Outpatient Medications  Medication Sig Dispense Refill  . albuterol (PROVENTIL  HFA) 108 (90 Base) MCG/ACT inhaler Inhale two puffs every 4-6 hours if needed for cough or wheeze 18 g 11  . azelastine (ASTELIN) 0.1 % nasal spray Place 2 sprays into both nostrils 2 (two) times daily. Use in each nostril as directed 30 mL 5  . cetirizine (ZYRTEC) 10 MG tablet Take 10 mg by mouth as needed for allergies.     . cholecalciferol (VITAMIN D3) 25 MCG (1000 UNIT) tablet Take 1,000 Units by mouth daily.    . diphenhydrAMINE (BENADRYL) 25 MG tablet Take 25 mg by mouth at bedtime as needed for allergies or sleep.     Marland Kitchen docusate sodium (STOOL SOFTENER) 100 MG capsule Take 100 mg by mouth daily.    Marland Kitchen EPINEPHrine (AUVI-Q) 0.3 mg/0.3 mL IJ SOAJ injection Use as directed for severe allergic reaction 2 each 2  . fluticasone (FLONASE) 50 MCG/ACT nasal spray Place 2 sprays into both nostrils daily as needed for allergies. 16 g 3  . hydrochlorothiazide (HYDRODIURIL) 25 MG tablet Take 1 tablet daily 90 tablet 1  . montelukast (SINGULAIR) 10 MG tablet Take 1 tablet at bedtime. 90 tablet 3  . Multiple Vitamin (MULTIVITAMIN) tablet Take 1 tablet by mouth daily.    . polyethylene glycol (MIRALAX / GLYCOLAX) packet Take 17 g by mouth daily.     No current facility-administered medications for this visit.    Family History  Problem Relation Age of Onset  . Emphysema Father   .  Lung cancer Father   . High Cholesterol Mother   . Osteoporosis Mother   . Hypertension Mother   . Migraines Daughter   . Hypertension Son        exercise  . Asthma Son   . Hypertension Sister   . COPD Other   . Breast cancer Neg Hx   . Allergic rhinitis Neg Hx   . Angioedema Neg Hx   . Atopy Neg Hx   . Eczema Neg Hx   . Immunodeficiency Neg Hx   . Urticaria Neg Hx     Review of Systems  Exam:   LMP 03/30/2012 Comment: Dr Cathie Olden - GYN    General appearance: alert, cooperative and appears stated age Head: normocephalic, without obvious abnormality, atraumatic Neck: no adenopathy, supple, symmetrical, trachea  midline and thyroid normal to inspection and palpation Lungs: clear to auscultation bilaterally Breasts: normal appearance, no masses or tenderness, No nipple retraction or dimpling, No nipple discharge or bleeding, No axillary adenopathy Heart: regular rate and rhythm Abdomen: soft, non-tender; no masses, no organomegaly Extremities: extremities normal, atraumatic, no cyanosis or edema Skin: skin color, texture, turgor normal. No rashes or lesions Lymph nodes: cervical, supraclavicular, and axillary nodes normal. Neurologic: grossly normal  Pelvic: External genitalia:  no lesions              No abnormal inguinal nodes palpated.              Urethra:  normal appearing urethra with no masses, tenderness or lesions              Bartholins and Skenes: normal                 Vagina: normal appearing vagina with normal color and discharge, no lesions              Cervix: no lesions              Pap taken: {yes no:314532} Bimanual Exam:  Uterus:  normal size, contour, position, consistency, mobility, non-tender              Adnexa: no mass, fullness, tenderness              Rectal exam: {yes no:314532}.  Confirms.              Anus:  normal sphincter tone, no lesions  Chaperone was present for exam.  Assessment:   Well woman visit with normal exam.   Plan: Mammogram screening discussed. Self breast awareness reviewed. Pap and HR HPV as above. Guidelines for Calcium, Vitamin D, regular exercise program including cardiovascular and weight bearing exercise.   Follow up annually and prn.   Additional counseling given.  {yes [id number]. _______ minutes face to face time of which over 50% was spent in counseling.    After visit summary provided.

## 2020-03-01 NOTE — Progress Notes (Deleted)
GYNECOLOGY  VISIT   HPI: 59 y.o.   Married  [place] American  female   [phone] with Patient's last menstrual period was 03/30/2012.   here for postmenopausal bleeding.  GYNECOLOGIC HISTORY: Patient's last menstrual period was 03/30/2012. Contraception: PMP Menopausal hormone therapy: ***None Last mammogram: 07-28-19 3D/Neg/density B/BiRads1 Last pap smear: 04-16-17 Neg:Neg HR HPV, 12-12-13 Neg:Neg HR HPV, 06-29-12 Neg        OB History    Gravida  2   Para  2   Term  2   Preterm      AB      Living  2     SAB      TAB      Ectopic      Multiple      Live Births  1              Patient Active Problem List   Diagnosis Date Noted  . HTN (hypertension) 11/04/2019  . IGT (impaired glucose tolerance) 11/04/2019  . Mild persistent asthma with acute exacerbation 05/31/2018  . Adverse food reaction 05/31/2018  . Mild persistent asthma, uncomplicated 64/40/3474  . Restrictive airway disease 11/12/2017  . Anaphylactic shock due to adverse food reaction 11/12/2017  . Chronic seasonal allergic rhinitis due to fungal spores 11/12/2017  . Class 1 obesity without serious comorbidity with body mass index (BMI) of 31.0 to 31.9 in adult 10/21/2016  . Chronic constipation 10/21/2016  . Seasonal and perennial allergic rhinitis 10/05/2007  . Asthma, mild intermittent, well-controlled 10/05/2007    Past Medical History:  Diagnosis Date  . Abnormal Pap smear of cervix   . Allergic rhinitis   . 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 Asthma   . GERD (gastroesophageal reflux disease)   . Headache(784.0)    tension- not an issue  . Hypertension   . IGT (impaired glucose tolerance) 11/04/2019  . Urticaria     Past Surgical History:  Procedure Laterality Date  . BALLOON DILATION N/A 11/19/2015   Procedure: BALLOON DILATION;  Surgeon: Garlan Fair, MD;  Location: [facility];  Service: Endoscopy;  Laterality: N/A;  . BUNIONECTOMY Bilateral   . COLONOSCOPY WITH PROPOFOL N/A  10/05/2012   Procedure: COLONOSCOPY WITH PROPOFOL;  Surgeon: Garlan Fair, MD;  Location: [facility];  Service: Endoscopy;  Laterality: N/A;  . ESOPHAGOGASTRODUODENOSCOPY (EGD) WITH PROPOFOL N/A 11/19/2015   Procedure: ESOPHAGOGASTRODUODENOSCOPY (EGD) WITH PROPOFOL;  Surgeon: Garlan Fair, MD;  Location: [facility];  Service: Endoscopy;  Laterality: N/A;  . TUBAL LIGATION  27 years ago    Current Outpatient Medications  Medication Sig Dispense Refill  . albuterol (PROVENTIL HFA) 108 (90 Base) MCG/ACT inhaler Inhale two puffs every 4-6 hours if needed for cough or wheeze 18 g 11  . azelastine (ASTELIN) 0.1 % nasal spray Place 2 sprays into both nostrils 2 (two) times daily. Use in each nostril as directed 30 mL 5  . cetirizine (ZYRTEC) 10 MG tablet Take 10 mg by mouth as needed for allergies.     . cholecalciferol (VITAMIN D3) 25 MCG (1000 UNIT) tablet Take 1,000 Units by mouth daily.    . diphenhydrAMINE (BENADRYL) 25 MG tablet Take 25 mg by mouth at bedtime as needed for allergies or sleep.     Marland Kitchen docusate sodium (STOOL SOFTENER) 100 MG capsule Take 100 mg by mouth daily.    Marland Kitchen EPINEPHrine (AUVI-Q) 0.3 mg/0.3 mL IJ SOAJ injection Use as directed for severe allergic reaction 2 each 2  .  fluticasone (FLONASE) 50 MCG/ACT nasal spray Place 2 sprays into both nostrils daily as needed for allergies. 16 g 3  . hydrochlorothiazide (HYDRODIURIL) 25 MG tablet Take 1 tablet daily 90 tablet 1  . montelukast (SINGULAIR) 10 MG tablet Take 1 tablet at bedtime. 90 tablet 3  . Multiple Vitamin (MULTIVITAMIN) tablet Take 1 tablet by mouth daily.    . polyethylene glycol (MIRALAX / GLYCOLAX) packet Take 17 g by mouth daily.     No current facility-administered medications for this visit.     ALLERGIES: Shellfish allergy, Sulfonamide derivatives, and Nasonex [mometasone furoate]  Family History  Problem Relation Age of Onset  . Emphysema Father   . Lung cancer Father   . High Cholesterol Mother    . Osteoporosis Mother   . Hypertension Mother   . Migraines Daughter   . Hypertension Son        exercise  . Asthma Son   . Hypertension Sister   . COPD Other   . Breast cancer Neg Hx   . Allergic rhinitis Neg Hx   . Angioedema Neg Hx   . Atopy Neg Hx   . Eczema Neg Hx   . Immunodeficiency Neg Hx   . Urticaria Neg Hx     Social History   Socioeconomic History  . Marital status: Married    Spouse name: Not on file  . Number of children: 2  . Years of education: Not on file  . Highest education level: Not on file  Occupational History  . Occupation: Biochemist, clinical,    Employer: [employer]  Tobacco Use  . Smoking status: Never Smoker  . Smokeless tobacco: Never Used  Vaping Use  . Vaping Use: Never used  Substance and Sexual Activity  . Alcohol use: No  . Drug use: No  . Sexual activity: Yes    Partners: Male    Birth control/protection: Surgical, Post-menopausal    Comment: Tubal Ligation  Other Topics Concern  . Not on file  Social History Narrative   Work or School: [employer] - full time, like job      Home Situation: lives with husband      Spiritual Beliefs: Christian - baptist      Lifestyle: no regular exercise, diet "bad"   Social Determinants of Health   Financial Resource Strain:   . Difficulty of Paying Living Expenses: Not on file  Food Insecurity:   . Worried About Charity fundraiser in the Last Year: Not on file  . Ran Out of Food in the Last Year: Not on file  Transportation Needs:   . Lack of Transportation (Medical): Not on file  . Lack of Transportation (Non-Medical): Not on file  Physical Activity:   . Days of Exercise per Week: Not on file  . Minutes of Exercise per Session: Not on file  Stress:   . Feeling of Stress : Not on file  Social Connections:   . Frequency of Communication with Friends and Family: Not on file  . Frequency of Social Gatherings with Friends and Family: Not on file  . Attends Religious  Services: Not on file  . Active Member of Clubs or Organizations: Not on file  . Attends Archivist Meetings: Not on file  . Marital Status: Not on file  Intimate Partner Violence:   . Fear of Current or Ex-Partner: Not on file  . Emotionally Abused: Not on file  . Physically Abused: Not on file  . Sexually  Abused: Not on file    Review of Systems  PHYSICAL EXAMINATION:    LMP 03/30/2012 Comment: Dr Cathie Olden - GYN    General appearance: alert, cooperative and appears stated age Head: Normocephalic, without obvious abnormality, atraumatic Neck: no adenopathy, supple, symmetrical, trachea midline and thyroid normal to inspection and palpation Lungs: clear to auscultation bilaterally Breasts: normal appearance, no masses or tenderness, No nipple retraction or dimpling, No nipple discharge or bleeding, No axillary or supraclavicular adenopathy Heart: regular rate and rhythm Abdomen: soft, non-tender, no masses,  no organomegaly Extremities: extremities normal, atraumatic, no cyanosis or edema Skin: Skin color, texture, turgor normal. No rashes or lesions Lymph nodes: Cervical, supraclavicular, and axillary nodes normal. No abnormal inguinal nodes palpated Neurologic: Grossly normal  Pelvic: External genitalia:  no lesions              Urethra:  normal appearing urethra with no masses, tenderness or lesions              Bartholins and Skenes: normal                 Vagina: normal appearing vagina with normal color and discharge, no lesions              Cervix: no lesions                Bimanual Exam:  Uterus:  normal size, contour, position, consistency, mobility, non-tender              Adnexa: no mass, fullness, tenderness              Rectal exam: {yes no:314532}.  Confirms.              Anus:  normal sphincter tone, no lesions  Chaperone was present for exam.  ASSESSMENT     PLAN     An After Visit Summary was printed and given to the patient.  ______  minutes face to face time of which over 50% was spent in counseling.

## 2020-03-06 ENCOUNTER — Ambulatory Visit: Payer: Self-pay | Admitting: Obstetrics and Gynecology

## 2020-03-06 ENCOUNTER — Telehealth: Payer: Self-pay | Admitting: Obstetrics and Gynecology

## 2020-03-06 DIAGNOSIS — N950 Postmenopausal bleeding: Secondary | ICD-10-CM

## 2020-03-06 NOTE — Telephone Encounter (Signed)
Patient is returning a call to Stephanie. °

## 2020-03-06 NOTE — Telephone Encounter (Signed)
Please see if patient can come for an office visit sooner than 03/19/20. She will need a pelvic ultrasound and possible endometrial biopsy.  These will need to be precerted.

## 2020-03-06 NOTE — Telephone Encounter (Signed)
Left message for pt to return call to triage RN. 

## 2020-03-06 NOTE — Telephone Encounter (Signed)
Spoke with pt. Pt needing to reschedule OV for PMB today due to Dr [name] in surgery. Pt states spotting stopped now. States spotting lasted 3 days with headache and cramps. Pt declines tomorrow appt due too soon for reschedule with work schedule. Pt scheduled now with Dr [name] on 9/20 at 70 am. Pt agreeable to date and time of appt.  Encounter closed. See previous encounter dated 03/01/20.  Routing to Dr [name] for update.

## 2020-03-06 NOTE — Telephone Encounter (Signed)
I spoke with patient to reschedule her PMB appointment due to md being delayed in surgery. To triage to assist with rescheduling. She declined an appointment for tomorrow.

## 2020-03-07 ENCOUNTER — Telehealth: Payer: Self-pay | Admitting: Obstetrics and Gynecology

## 2020-03-07 NOTE — Telephone Encounter (Signed)
Spoke with patient, advised per Dr. [name].  Patient declines PUS/OV offered on 9/9 due to her work schedule.  PUS and possible EMB scheduled for 9/14 at 12:30pm, consult to follow with Dr. [name]. Orders placed for precert.  Advised to take Motrin 800 mg with food and water one hour before procedure. 03/19/20 OV cancelled.  Patient verbalizes understanding and is agreeable.   Routing to provider for final review. Patient is agreeable to disposition. Will close encounter.  Cc: Hayley Carder

## 2020-03-07 NOTE — Telephone Encounter (Signed)
Spoke with patient regarding benefits for scheduled Pelvic ultrasound and Endometrial biopsy. Patient acknowledges understanding of information presented. Encounter closed.

## 2020-03-13 ENCOUNTER — Ambulatory Visit (INDEPENDENT_AMBULATORY_CARE_PROVIDER_SITE_OTHER): Payer: 59

## 2020-03-13 ENCOUNTER — Other Ambulatory Visit: Payer: Self-pay

## 2020-03-13 ENCOUNTER — Encounter: Payer: Self-pay | Admitting: Obstetrics and Gynecology

## 2020-03-13 ENCOUNTER — Ambulatory Visit (INDEPENDENT_AMBULATORY_CARE_PROVIDER_SITE_OTHER): Payer: 59 | Admitting: Obstetrics and Gynecology

## 2020-03-13 VITALS — BP 118/70 | HR 68 | Resp 14 | Ht 64.0 in | Wt 170.2 lb

## 2020-03-13 DIAGNOSIS — N950 Postmenopausal bleeding: Secondary | ICD-10-CM

## 2020-03-13 DIAGNOSIS — D219 Benign neoplasm of connective and other soft tissue, unspecified: Secondary | ICD-10-CM

## 2020-03-13 NOTE — Progress Notes (Signed)
Encounter reviewed by Dr. Camira Geidel Amundson C. Silva.  

## 2020-03-13 NOTE — Patient Instructions (Signed)
Uterine Fibroids  Uterine fibroids (leiomyomas) are noncancerous (benign) tumors that can develop in the uterus. Fibroids may also develop in the fallopian tubes, cervix, or tissues (ligaments) near the uterus. You may have one or many fibroids. Fibroids vary in size, weight, and where they grow in the uterus. Some can become quite large. Most fibroids do not require medical treatment. What are the causes? The cause of this condition is not known. What increases the risk? You are more likely to develop this condition if you:  Are in your 30s or 40s and have not gone through menopause.  Have a family history of this condition.  Are of African-American descent.  Had your first period at an early age (early menarche).  Have not had any children (nulliparity).  Are overweight or obese. What are the signs or symptoms? Many women do not have any symptoms. Symptoms of this condition may include:  Heavy menstrual bleeding.  Bleeding or spotting between periods.  Pain and pressure in the pelvic area, between the hips.  Bladder problems, such as needing to urinate urgently or more often than usual.  Inability to have children (infertility).  Failure to carry pregnancy to term (miscarriage). How is this diagnosed? This condition may be diagnosed based on:  Your symptoms and medical history.  A physical exam.  A pelvic exam that includes feeling for any tumors.  Imaging tests, such as ultrasound or MRI. How is this treated? Treatment for this condition may include:  Seeing your health care provider for follow-up visits to monitor your fibroids for any changes.  Taking NSAIDs such as ibuprofen, naproxen, or aspirin to reduce pain.  Hormone medicines. These may be taken as a pill, given in an injection, or delivered by a T-shaped device that is inserted into the uterus (intrauterine device, IUD).  Surgery to remove one of the following: ? The fibroids (myomectomy). Your health  care provider may recommend this if fibroids affect your fertility and you want to become pregnant. ? The uterus (hysterectomy). ? Blood supply to the fibroids (uterine artery embolization). Follow these instructions at home:  Take over-the-counter and prescription medicines only as told by your health care provider.  Ask your health care provider if you should take iron pills or eat more iron-rich foods, such as dark green, leafy vegetables. Heavy menstrual bleeding can cause low iron levels.  If directed, apply heat to your back or abdomen to reduce pain. Use the heat source that your health care provider recommends, such as a moist heat pack or a heating pad. ? Place a towel between your skin and the heat source. ? Leave the heat on for 20-30 minutes. ? Remove the heat if your skin turns bright red. This is especially important if you are unable to feel pain, heat, or cold. You may have a greater risk of getting burned.  Pay close attention to your menstrual cycle. Tell your health care provider about any changes, such as: ? Increased blood flow that requires you to use more pads or tampons than usual. ? A change in the number of days that your period lasts. ? A change in symptoms that are associated with your period, such as back pain or cramps in your abdomen.  Keep all follow-up visits as told by your health care provider. This is important, especially if your fibroids need to be monitored for any changes. Contact a health care provider if you:  Have pelvic pain, back pain, or cramps in your abdomen that   do not get better with medicine or heat.  Develop new bleeding between periods.  Have increased bleeding during or between periods.  Feel unusually tired or weak.  Feel light-headed. Get help right away if you:  Faint.  Have pelvic pain that suddenly gets worse.  Have severe vaginal bleeding that soaks a tampon or pad in 30 minutes or less. Summary  Uterine fibroids are  noncancerous (benign) tumors that can develop in the uterus.  The exact cause of this condition is not known.  Most fibroids do not require medical treatment unless they affect your ability to have children (fertility).  Contact a health care provider if you have pelvic pain, back pain, or cramps in your abdomen that do not get better with medicines.  Make sure you know what symptoms should cause you to get help right away. This information is not intended to replace advice given to you by your health care provider. Make sure you discuss any questions you have with your health care provider. Document Revised: 05/29/2017 Document Reviewed: 05/12/2017 Elsevier Patient Education  2020 [place]. Postmenopausal Bleeding  Postmenopausal bleeding is any bleeding that occurs after menopause. Menopause is when a woman's period stops. Any type of bleeding after menopause should be checked by your doctor. Treatment will depend on the cause. Follow these instructions at home:  Pay attention to any changes in your symptoms.  Avoid using tampons and douches as told by your doctor.  Change your pads regularly.  Get regular pelvic exams and Pap tests.  Take iron pills as told by your doctor.  Take over-the-counter and prescription medicines only as told by your doctor.  Keep all follow-up visits as told by your doctor. This is important. Contact a doctor if:  Your bleeding lasts for more than 1 week.  You have pain in your belly (abdomen).  You have bleeding during or after sex.  You have bleeding that happens more often than every 3 weeks. Get help right away if:  You have fever, chills, headache, dizziness, muscle aches, or bleeding.  You have very bad pain with bleeding.  You have clumps of blood (blood clots) coming from your vagina.  You have a lot of bleeding, and: ? You use more than 1 pad an hour. ? This kind of bleeding has never happened before.  You feel like you  are going to pass out (faint). Summary  Any type of bleeding after menopause should be checked by your doctor.  Pay attention to any changes in your symptoms.  Keep all follow-up visits as told by your doctor. This information is not intended to replace advice given to you by your health care provider. Make sure you discuss any questions you have with your health care provider. Document Revised: 09/02/2018 Document Reviewed: 07/22/2016 Elsevier Patient Education  2020 [employer].

## 2020-03-13 NOTE — Progress Notes (Signed)
GYNECOLOGY  VISIT   HPI: 59 y.o.   Married  [place] American  female   [phone] with Patient's last menstrual period was 03/30/2012.   here for ultrasound and EMB.     Noted blood with wiping for 3 days.  It stopped.  She had symptoms of a menstruation - cramping, back ache and headache.  Not using any hormonal treatment.   Had not had recent intercourse or placed anything in the vagina.   She denies vaginal dryness.   No change in partner.   GYNECOLOGIC HISTORY: Patient's last menstrual period was 03/30/2012. Contraception:  Postmenopausal Menopausal hormone therapy:  none Last mammogram:  07/28/19 BIRADS 1 negative/density b Last pap smear:   04/16/17 Neg:Neg HR HPV        OB History    Gravida  2   Para  2   Term  2   Preterm      AB      Living  2     SAB      TAB      Ectopic      Multiple      Live Births  1              Patient Active Problem List   Diagnosis Date Noted  . HTN (hypertension) 11/04/2019  . IGT (impaired glucose tolerance) 11/04/2019  . Mild persistent asthma with acute exacerbation 05/31/2018  . Adverse food reaction 05/31/2018  . Mild persistent asthma, uncomplicated 39/76/7341  . Restrictive airway disease 11/12/2017  . Anaphylactic shock due to adverse food reaction 11/12/2017  . Chronic seasonal allergic rhinitis due to fungal spores 11/12/2017  . Class 1 obesity without serious comorbidity with body mass index (BMI) of 31.0 to 31.9 in adult 10/21/2016  . Chronic constipation 10/21/2016  . Seasonal and perennial allergic rhinitis 10/05/2007  . Asthma, mild intermittent, well-controlled 10/05/2007    Past Medical History:  Diagnosis Date  . Abnormal Pap smear of cervix   . Allergic rhinitis   . 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 Asthma   . GERD (gastroesophageal reflux disease)   . Headache(784.0)    tension- not an issue  . Hypertension   . IGT (impaired glucose tolerance) 11/04/2019  . Urticaria     Past  Surgical History:  Procedure Laterality Date  . BALLOON DILATION N/A 11/19/2015   Procedure: BALLOON DILATION;  Surgeon: Garlan Fair, MD;  Location: [facility];  Service: Endoscopy;  Laterality: N/A;  . BUNIONECTOMY Bilateral   . COLONOSCOPY WITH PROPOFOL N/A 10/05/2012   Procedure: COLONOSCOPY WITH PROPOFOL;  Surgeon: Garlan Fair, MD;  Location: [facility];  Service: Endoscopy;  Laterality: N/A;  . ESOPHAGOGASTRODUODENOSCOPY (EGD) WITH PROPOFOL N/A 11/19/2015   Procedure: ESOPHAGOGASTRODUODENOSCOPY (EGD) WITH PROPOFOL;  Surgeon: Garlan Fair, MD;  Location: [facility];  Service: Endoscopy;  Laterality: N/A;  . TUBAL LIGATION  27 years ago    Current Outpatient Medications  Medication Sig Dispense Refill  . albuterol (PROVENTIL HFA) 108 (90 Base) MCG/ACT inhaler Inhale two puffs every 4-6 hours if needed for cough or wheeze 18 g 11  . azelastine (ASTELIN) 0.1 % nasal spray Place 2 sprays into both nostrils 2 (two) times daily. Use in each nostril as directed 30 mL 5  . cetirizine (ZYRTEC) 10 MG tablet Take 10 mg by mouth as needed for allergies.     . cholecalciferol (VITAMIN D3) 25 MCG (1000 UNIT) tablet Take 1,000 Units by mouth daily.    Marland Kitchen  diphenhydrAMINE (BENADRYL) 25 MG tablet Take 25 mg by mouth at bedtime as needed for allergies or sleep.     Marland Kitchen docusate sodium (STOOL SOFTENER) 100 MG capsule Take 100 mg by mouth daily.    Marland Kitchen EPINEPHrine (AUVI-Q) 0.3 mg/0.3 mL IJ SOAJ injection Use as directed for severe allergic reaction 2 each 2  . fluticasone (FLONASE) 50 MCG/ACT nasal spray Place 2 sprays into both nostrils daily as needed for allergies. 16 g 3  . hydrochlorothiazide (HYDRODIURIL) 25 MG tablet Take 1 tablet daily 90 tablet 1  . montelukast (SINGULAIR) 10 MG tablet Take 1 tablet at bedtime. 90 tablet 3  . Multiple Vitamin (MULTIVITAMIN) tablet Take 1 tablet by mouth daily.    . polyethylene glycol (MIRALAX / GLYCOLAX) packet Take 17 g by mouth daily.     No current  facility-administered medications for this visit.     ALLERGIES: Shellfish allergy, Sulfonamide derivatives, and Nasonex [mometasone furoate]  Family History  Problem Relation Age of Onset  . Emphysema Father   . Lung cancer Father   . High Cholesterol Mother   . Osteoporosis Mother   . Hypertension Mother   . Migraines Daughter   . Hypertension Son        exercise  . Asthma Son   . Hypertension Sister   . COPD Other   . Breast cancer Neg Hx   . Allergic rhinitis Neg Hx   . Angioedema Neg Hx   . Atopy Neg Hx   . Eczema Neg Hx   . Immunodeficiency Neg Hx   . Urticaria Neg Hx     Social History   Socioeconomic History  . Marital status: Married    Spouse name: Not on file  . Number of children: 2  . Years of education: Not on file  . Highest education level: Not on file  Occupational History  . Occupation: Biochemist, clinical,    Employer: [employer]  Tobacco Use  . Smoking status: Never Smoker  . Smokeless tobacco: Never Used  Vaping Use  . Vaping Use: Never used  Substance and Sexual Activity  . Alcohol use: No  . Drug use: No  . Sexual activity: Yes    Partners: Male    Birth control/protection: Surgical, Post-menopausal    Comment: Tubal Ligation  Other Topics Concern  . Not on file  Social History Narrative   Work or School: [employer] - full time, like job      Home Situation: lives with husband      Spiritual Beliefs: Christian - baptist      Lifestyle: no regular exercise, diet "bad"   Social Determinants of Health   Financial Resource Strain:   . Difficulty of Paying Living Expenses: Not on file  Food Insecurity:   . Worried About Charity fundraiser in the Last Year: Not on file  . Ran Out of Food in the Last Year: Not on file  Transportation Needs:   . Lack of Transportation (Medical): Not on file  . Lack of Transportation (Non-Medical): Not on file  Physical Activity:   . Days of Exercise per Week: Not on file  . Minutes of  Exercise per Session: Not on file  Stress:   . Feeling of Stress : Not on file  Social Connections:   . Frequency of Communication with Friends and Family: Not on file  . Frequency of Social Gatherings with Friends and Family: Not on file  . Attends Religious Services: Not on file  .  Active Member of Clubs or Organizations: Not on file  . Attends Archivist Meetings: Not on file  . Marital Status: Not on file  Intimate Partner Violence:   . Fear of Current or Ex-Partner: Not on file  . Emotionally Abused: Not on file  . Physically Abused: Not on file  . Sexually Abused: Not on file    Review of Systems  Constitutional: Negative.   HENT: Negative.   Eyes: Negative.   Respiratory: Negative.   Cardiovascular: Negative.   Gastrointestinal: Negative.   Endocrine: Negative.   Genitourinary: Negative.   Musculoskeletal: Negative.   Skin: Negative.   Allergic/Immunologic: Negative.   Neurological: Negative.   Hematological: Negative.   Psychiatric/Behavioral: Negative.     PHYSICAL EXAMINATION:    BP 118/70 (BP Location: Right Arm, Patient Position: Sitting, Cuff Size: Normal)   Pulse 68   Resp 14   Ht 5\' 4"  (1.626 m)   Wt 170 lb 3.2 oz (77.2 kg)   LMP 03/30/2012 Comment: Dr Cathie Olden - GYN  BMI 29.21 kg/m     General appearance: alert, cooperative and appears stated age   Pelvic: External genitalia:  no lesions              Urethra:  normal appearing urethra with no masses, tenderness or lesions              Bartholins and Skenes: normal                 Vagina: normal appearing vagina with normal color and discharge, no lesions              Cervix: no lesions                Bimanual Exam:  Uterus:  normal size, contour, position, consistency, mobility, non-tender              Adnexa: no mass, fullness, tenderness             Chaperone was present for exam:  Terence Lux.   Pelvic US Uterus 3 subserosal fibroids - 1.53 mm, 0.44 mm, 0.76 mm. EMS 1.93  mm. Ovaries normal.  No adnexal masses.  No free fluid.   ASSESSMENT  Postmenopausal bleeding.  Fibroids.   PLAN  We discussed postmenopausal bleeding and potential etiologies.  I suspect atrophy.  Fibroids reviewed - their benign nature, rare risk of sarcoma, and symptoms.  No EMB needed today.  She will call for any future postmenopausal bleeding and would then do an EMB. FU in December for annual exam and cervical cancer screening.  Cannot do today due to gel used for pelvic US.  Questions invited and answered.

## 2020-03-19 ENCOUNTER — Ambulatory Visit: Payer: Self-pay | Admitting: Obstetrics and Gynecology

## 2020-04-16 ENCOUNTER — Other Ambulatory Visit: Payer: Self-pay | Admitting: Internal Medicine

## 2020-04-16 DIAGNOSIS — I10 Essential (primary) hypertension: Secondary | ICD-10-CM

## 2020-04-30 DIAGNOSIS — S83209A Unspecified tear of unspecified meniscus, current injury, unspecified knee, initial encounter: Secondary | ICD-10-CM

## 2020-04-30 HISTORY — DX: Unspecified tear of unspecified meniscus, current injury, unspecified knee, initial encounter: S83.209A

## 2020-05-30 ENCOUNTER — Ambulatory Visit: Payer: 59 | Admitting: Family Medicine

## 2020-05-30 ENCOUNTER — Other Ambulatory Visit: Payer: Self-pay

## 2020-06-11 ENCOUNTER — Encounter: Payer: Self-pay | Admitting: Obstetrics and Gynecology

## 2020-06-11 ENCOUNTER — Ambulatory Visit (INDEPENDENT_AMBULATORY_CARE_PROVIDER_SITE_OTHER): Payer: 59 | Admitting: Obstetrics and Gynecology

## 2020-06-11 ENCOUNTER — Other Ambulatory Visit: Payer: Self-pay

## 2020-06-11 ENCOUNTER — Other Ambulatory Visit (HOSPITAL_COMMUNITY)
Admission: RE | Admit: 2020-06-11 | Discharge: 2020-06-11 | Disposition: A | Discharge status: Home / Self Care | Payer: 59 | Source: Ambulatory Visit | Attending: Obstetrics and Gynecology | Admitting: Obstetrics and Gynecology

## 2020-06-11 VITALS — BP 128/82 | HR 57 | Ht 64.0 in | Wt 167.0 lb

## 2020-06-11 DIAGNOSIS — Z01419 Encounter for gynecological examination (general) (routine) without abnormal findings: Secondary | ICD-10-CM | POA: Diagnosis not present

## 2020-06-11 NOTE — Patient Instructions (Signed)

## 2020-06-11 NOTE — Progress Notes (Signed)
59 y.o. G40P2002 Married [place] American female here for annual exam.    No more spotting or bleeding.  She had fibroids on [place] 03/13/20.  Normal ovaries.  EMS was 1.93 mm.   Tore her right meniscus.   Lost 15 - 17 pounds.  Working from home for almost 2 years.   Doing her Shingles vaccine.  Did her booster for Covid.  Received her flu vaccine.  PCP: Pearletha Alfred, MD  Patient's last menstrual period was 03/30/2012.           Sexually active: Yes.    The current method of family planning is tubal/post menopausal status.    Exercising: No.  not now due to knee injury--she was walking 3x/week prior to injury Smoker:  no  Health Maintenance: Pap: 04-16-17 Neg:Neg HR HPV, 12-12-13 Neg:Neg HR HPV, 06-29-12 Neg History of abnormal Pap:  Yes, Hx LGSIL in 2010 MMG: 07-28-19 3D/Neg/density B/Birads1 Colonoscopy: 08/2012 normal;next 10 years BMD:   n/a  Result  n/a TDaP:  Td 08-23-16 Gardasil:   no HIV: probably tested during preg Hep C: 10-21-16 Neg Screening Labs:  PCP.   reports that she has never smoked. She has never used smokeless tobacco. She reports that she does not drink alcohol and does not use drugs.  Past Medical History:  Diagnosis Date  . Abnormal Pap smear of cervix   . Allergic rhinitis   . 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 Asthma   . Fibroids, subserous   . GERD (gastroesophageal reflux disease)   . Headache(784.0)    tension- not an issue  . Hypertension   . IGT (impaired glucose tolerance) 11/04/2019  . Torn meniscus 04/2020   right  . Urticaria     Past Surgical History:  Procedure Laterality Date  . BALLOON DILATION N/A 11/19/2015   Procedure: BALLOON DILATION;  Surgeon: Garlan Fair, MD;  Location: [facility];  Service: Endoscopy;  Laterality: N/A;  . BUNIONECTOMY Bilateral   . COLONOSCOPY WITH PROPOFOL N/A 10/05/2012   Procedure: COLONOSCOPY WITH PROPOFOL;  Surgeon: Garlan Fair, MD;  Location: [facility];  Service: Endoscopy;   Laterality: N/A;  . ESOPHAGOGASTRODUODENOSCOPY (EGD) WITH PROPOFOL N/A 11/19/2015   Procedure: ESOPHAGOGASTRODUODENOSCOPY (EGD) WITH PROPOFOL;  Surgeon: Garlan Fair, MD;  Location: [facility];  Service: Endoscopy;  Laterality: N/A;  . TUBAL LIGATION  27 years ago    Current Outpatient Medications  Medication Sig Dispense Refill  . albuterol (PROVENTIL HFA) 108 (90 Base) MCG/ACT inhaler Inhale two puffs every 4-6 hours if needed for cough or wheeze 18 g 11  . azelastine (ASTELIN) 0.1 % nasal spray Place 2 sprays into both nostrils 2 (two) times daily. Use in each nostril as directed 30 mL 5  . cetirizine (ZYRTEC) 10 MG tablet Take 10 mg by mouth as needed for allergies.     . cholecalciferol (VITAMIN D3) 25 MCG (1000 UNIT) tablet Take 1,000 Units by mouth daily.    . diphenhydrAMINE (BENADRYL) 25 MG tablet Take 25 mg by mouth at bedtime as needed for allergies or sleep.     Marland Kitchen docusate sodium (COLACE) 100 MG capsule Take 100 mg by mouth daily.    Marland Kitchen EPINEPHrine (AUVI-Q) 0.3 mg/0.3 mL IJ SOAJ injection Use as directed for severe allergic reaction 2 each 2  . fluticasone (CUTIVATE) 0.05 % cream fluticasone propionate 0.05 % topical cream  APPLY SMALL AMOUNT TOPICALLY TO THE AFFECTED AREA TWICE DAILY AS NEEDED    . fluticasone (FLONASE)  50 MCG/ACT nasal spray Place 2 sprays into both nostrils daily as needed for allergies. 16 g 3  . hydrochlorothiazide (HYDRODIURIL) 25 MG tablet TAKE 1 TABLET DAILY (LAST REFILL, NEED APPOINTMENT) 90 tablet 3  . meloxicam (MOBIC) 7.5 MG tablet Take 1 tablet by mouth 2 (two) times daily.    . montelukast (SINGULAIR) 10 MG tablet Take 1 tablet at bedtime. 90 tablet 3  . Multiple Vitamin (MULTIVITAMIN) tablet Take 1 tablet by mouth daily.    . polyethylene glycol (MIRALAX / GLYCOLAX) packet Take 17 g by mouth daily.    Marland Kitchen triamcinolone (KENALOG) 0.1 % triamcinolone acetonide 0.1 % topical cream  APPLY SMALL AMOUNT TOPICALLY TO THE AFFECTED AREA TWICE DAILY AS  NEEDED     No current facility-administered medications for this visit.    Family History  Problem Relation Age of Onset  . Emphysema Father   . Lung cancer Father   . High Cholesterol Mother   . Osteoporosis Mother   . Hypertension Mother   . Migraines Daughter   . Hypertension Son        exercise  . Asthma Son   . Hypertension Sister   . COPD Other   . Breast cancer Neg Hx   . Allergic rhinitis Neg Hx   . Angioedema Neg Hx   . Atopy Neg Hx   . Eczema Neg Hx   . Immunodeficiency Neg Hx   . Urticaria Neg Hx     Review of Systems  All other systems reviewed and are negative.   Exam:   BP 128/82   Pulse (!) 57   Ht 5\' 4"  (1.626 m)   Wt 167 lb (75.8 kg)   LMP 03/30/2012 Comment: Dr Cathie Olden - GYN  SpO2 99%   BMI 28.67 kg/m     General appearance: alert, cooperative and appears stated age Head: normocephalic, without obvious abnormality, atraumatic Neck: no adenopathy, supple, symmetrical, trachea midline and thyroid normal to inspection and palpation Lungs: clear to auscultation bilaterally Breasts: normal appearance, no masses or tenderness, No nipple retraction or dimpling, No nipple discharge or bleeding, No axillary adenopathy Heart: regular rate and rhythm Abdomen: umbilical hernia which is nontender. Abdomen is soft, non-tender; no masses, no organomegaly Extremities: extremities normal, atraumatic, no cyanosis or edema Skin: skin color, texture, turgor normal. No rashes or lesions Lymph nodes: cervical, supraclavicular, and axillary nodes normal. Neurologic: grossly normal  Pelvic: External genitalia:  no lesions              No abnormal inguinal nodes palpated.              Urethra:  normal appearing urethra with no masses, tenderness or lesions              Bartholins and Skenes: normal                 Vagina: normal appearing vagina with normal color and discharge, no lesions              Cervix: no lesions              Pap taken: Yes.   Bimanual Exam:   Uterus:  normal size, contour, position, consistency, mobility, non-tender              Adnexa: no mass, fullness, tenderness              Rectal exam: Yes.  .  Confirms.  Anus:  normal sphincter tone, no lesions  Chaperone was present for exam.  Assessment:   Well woman visit with normal exam. Hx LGSIL.  Umbilical hernia.   Plan: Mammogram screening discussed. Self breast awareness reviewed. Pap and HR HPV as above. Guidelines for Calcium, Vitamin D, regular exercise program including cardiovascular and weight bearing exercise. Labs with PCP.  She knows to watch out for increasing size of her umbilical hernia and pain.  Fu annually and prn.

## 2020-06-14 LAB — CYTOLOGY - PAP
Comment: NEGATIVE
Diagnosis: NEGATIVE
High risk HPV: NEGATIVE

## 2020-07-03 ENCOUNTER — Other Ambulatory Visit: Payer: Self-pay | Admitting: Internal Medicine

## 2020-07-03 DIAGNOSIS — Z1231 Encounter for screening mammogram for malignant neoplasm of breast: Secondary | ICD-10-CM

## 2020-08-08 ENCOUNTER — Other Ambulatory Visit: Payer: Self-pay

## 2020-08-09 ENCOUNTER — Ambulatory Visit (INDEPENDENT_AMBULATORY_CARE_PROVIDER_SITE_OTHER): Payer: 59 | Admitting: Internal Medicine

## 2020-08-09 ENCOUNTER — Encounter: Payer: Self-pay | Admitting: Internal Medicine

## 2020-08-09 VITALS — BP 110/80 | HR 83 | Temp 98.1°F | Wt 170.2 lb

## 2020-08-09 DIAGNOSIS — I10 Essential (primary) hypertension: Secondary | ICD-10-CM

## 2020-08-09 DIAGNOSIS — R7302 Impaired glucose tolerance (oral): Secondary | ICD-10-CM | POA: Diagnosis not present

## 2020-08-09 DIAGNOSIS — J4531 Mild persistent asthma with (acute) exacerbation: Secondary | ICD-10-CM

## 2020-08-09 DIAGNOSIS — Z23 Encounter for immunization: Secondary | ICD-10-CM

## 2020-08-09 LAB — POCT GLYCOSYLATED HEMOGLOBIN (HGB A1C): Hemoglobin A1C: 5.9 % — AB (ref 4.0–5.6)

## 2020-08-09 NOTE — Progress Notes (Signed)
Established Patient Office Visit     This visit occurred during the SARS-CoV-2 public health emergency.  Safety protocols were in place, including screening questions prior to the visit, additional usage of staff PPE, and extensive cleaning of exam room while observing appropriate contact time as indicated for disinfecting solutions.    CC/Reason for Visit: 70-month follow-up chronic medical conditions  HPI: Victoria [name] is a 60 y.o. female who is coming in today for the above mentioned reasons. Past Medical History is significant for: Obesity, impaired glucose tolerance, hypertension, asthma.  Since I last saw her she suffered a right meniscal injury that thankfully healed without surgery.  Her asthma has been stable without recent flareups.  She is requesting her second shingles vaccine.   Past Medical/Surgical History: Past Medical History:  Diagnosis Date  . Abnormal Pap smear of cervix   . Allergic rhinitis   . 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 Asthma   . Fibroids, subserous   . GERD (gastroesophageal reflux disease)   . Headache(784.0)    tension- not an issue  . Hypertension   . IGT (impaired glucose tolerance) 11/04/2019  . Torn meniscus 04/2020   right  . Urticaria     Past Surgical History:  Procedure Laterality Date  . BALLOON DILATION N/A 11/19/2015   Procedure: BALLOON DILATION;  Surgeon: Garlan Fair, MD;  Location: [facility];  Service: Endoscopy;  Laterality: N/A;  . BUNIONECTOMY Bilateral   . COLONOSCOPY WITH PROPOFOL N/A 10/05/2012   Procedure: COLONOSCOPY WITH PROPOFOL;  Surgeon: Garlan Fair, MD;  Location: [facility];  Service: Endoscopy;  Laterality: N/A;  . ESOPHAGOGASTRODUODENOSCOPY (EGD) WITH PROPOFOL N/A 11/19/2015   Procedure: ESOPHAGOGASTRODUODENOSCOPY (EGD) WITH PROPOFOL;  Surgeon: Garlan Fair, MD;  Location: [facility];  Service: Endoscopy;  Laterality: N/A;  . TUBAL LIGATION  27 years ago    Social History:  reports  that she has never smoked. She has never used smokeless tobacco. She reports that she does not drink alcohol and does not use drugs.  Allergies: Allergies  Allergen Reactions  . Shellfish Allergy Shortness Of Breath and Itching  . Sulfonamide Derivatives Itching, Swelling and Other (See Comments)    Throat swells  . Nasonex [Mometasone Furoate] Other (See Comments)    Nose bleeds    Family History:  Family History  Problem Relation Age of Onset  . Emphysema Father   . Lung cancer Father   . High Cholesterol Mother   . Osteoporosis Mother   . Hypertension Mother   . Migraines Daughter   . Hypertension Son        exercise  . Asthma Son   . Hypertension Sister   . COPD Other   . Breast cancer Neg Hx   . Allergic rhinitis Neg Hx   . Angioedema Neg Hx   . Atopy Neg Hx   . Eczema Neg Hx   . Immunodeficiency Neg Hx   . Urticaria Neg Hx      Current Outpatient Medications:  .  albuterol (PROVENTIL HFA) 108 (90 Base) MCG/ACT inhaler, Inhale two puffs every 4-6 hours if needed for cough or wheeze, Disp: 18 g, Rfl: 11 .  azelastine (ASTELIN) 0.1 % nasal spray, Place 2 sprays into both nostrils 2 (two) times daily. Use in each nostril as directed, Disp: 30 mL, Rfl: 5 .  cetirizine (ZYRTEC) 10 MG tablet, Take 10 mg by mouth as needed for allergies. , Disp: , Rfl:  .  cholecalciferol (VITAMIN D3) 25 MCG (1000 UNIT) tablet, Take 1,000 Units by mouth daily., Disp: , Rfl:  .  diphenhydrAMINE (BENADRYL) 25 MG tablet, Take 25 mg by mouth at bedtime as needed for allergies or sleep. , Disp: , Rfl:  .  docusate sodium (COLACE) 100 MG capsule, Take 100 mg by mouth daily., Disp: , Rfl:  .  EPINEPHrine (AUVI-Q) 0.3 mg/0.3 mL IJ SOAJ injection, Use as directed for severe allergic reaction, Disp: 2 each, Rfl: 2 .  fluticasone (CUTIVATE) 0.05 % cream, fluticasone propionate 0.05 % topical cream  APPLY SMALL AMOUNT TOPICALLY TO THE AFFECTED AREA TWICE DAILY AS NEEDED, Disp: , Rfl:  .  fluticasone  (FLONASE) 50 MCG/ACT nasal spray, Place 2 sprays into both nostrils daily as needed for allergies., Disp: 16 g, Rfl: 3 .  hydrochlorothiazide (HYDRODIURIL) 25 MG tablet, TAKE 1 TABLET DAILY (LAST REFILL, NEED APPOINTMENT), Disp: 90 tablet, Rfl: 3 .  meloxicam (MOBIC) 7.5 MG tablet, Take 1 tablet by mouth 2 (two) times daily., Disp: , Rfl:  .  montelukast (SINGULAIR) 10 MG tablet, Take 1 tablet at bedtime., Disp: 90 tablet, Rfl: 3 .  Multiple Vitamin (MULTIVITAMIN) tablet, Take 1 tablet by mouth daily., Disp: , Rfl:  .  polyethylene glycol (MIRALAX / GLYCOLAX) packet, Take 17 g by mouth daily., Disp: , Rfl:  .  triamcinolone (KENALOG) 0.1 %, triamcinolone acetonide 0.1 % topical cream  APPLY SMALL AMOUNT TOPICALLY TO THE AFFECTED AREA TWICE DAILY AS NEEDED, Disp: , Rfl:   Review of Systems:  Constitutional: Denies fever, chills, diaphoresis, appetite change and fatigue.  HEENT: Denies photophobia, eye pain, redness, hearing loss, ear pain, congestion, sore throat, rhinorrhea, sneezing, mouth sores, trouble swallowing, neck pain, neck stiffness and tinnitus.   Respiratory: Denies SOB, DOE, cough, chest tightness,  and wheezing.   Cardiovascular: Denies chest pain, palpitations and leg swelling.  Gastrointestinal: Denies nausea, vomiting, abdominal pain, diarrhea, constipation, blood in stool and abdominal distention.  Genitourinary: Denies dysuria, urgency, frequency, hematuria, flank pain and difficulty urinating.  Endocrine: Denies: hot or cold intolerance, sweats, changes in hair or nails, polyuria, polydipsia. Musculoskeletal: Denies myalgias, back pain, joint swelling, arthralgias and gait problem.  Skin: Denies pallor, rash and wound.  Neurological: Denies dizziness, seizures, syncope, weakness, light-headedness, numbness and headaches.  Hematological: Denies adenopathy. Easy bruising, personal or family bleeding history  Psychiatric/Behavioral: Denies suicidal ideation, mood changes,  confusion, nervousness, sleep disturbance and agitation    Physical Exam: Vitals:   08/09/20 1459  BP: 110/80  Pulse: 83  Temp: 98.1 F (36.7 C)  TempSrc: Oral  SpO2: 97%  Weight: 170 lb 3.2 oz (77.2 kg)    Body mass index is 29.21 kg/m.   Constitutional: NAD, calm, comfortable Eyes: PERRL, lids and conjunctivae normal, wears corrective lenses ENMT: Mucous membranes are moist.  Respiratory: clear to auscultation bilaterally, no wheezing, no crackles. Normal respiratory effort. No accessory muscle use.  Cardiovascular: Regular rate and rhythm, no murmurs / rubs / gallops. No extremity edema.  Neurologic: Grossly intact and nonfocal Psychiatric: Normal judgment and insight. Alert and oriented x 3. Normal mood.    Impression and Plan:  IGT (impaired glucose tolerance)  -A1c is stable at 5.9, continue lifestyle changes.  Primary hypertension -Well-controlled on hydrochlorothiazide.  Mild persistent asthma with acute exacerbation -Stable, no recent flareups.  Need for shingles vaccine -Second shingles vaccine today.   Patient Instructions  -Nice seeing you today!!  -Second shingles vaccine today.  -Schedule follow up in 6 months for your  physical. Please come in fasting that day.     Lelon Frohlich, MD [facility]

## 2020-08-09 NOTE — Addendum Note (Signed)
Addended by: Westley Hummer B on: 08/09/2020 04:37 PM   Modules accepted: Orders

## 2020-08-09 NOTE — Patient Instructions (Signed)
-  Nice seeing you today!!  -Second shingles vaccine today.  -Schedule follow up in 6 months for your physical. Please come in fasting that day.

## 2020-08-16 ENCOUNTER — Other Ambulatory Visit: Payer: Self-pay

## 2020-08-16 ENCOUNTER — Ambulatory Visit
Admission: RE | Admit: 2020-08-16 | Discharge: 2020-08-16 | Disposition: A | Discharge status: Home / Self Care | Payer: 59 | Source: Ambulatory Visit | Attending: Internal Medicine | Admitting: Internal Medicine

## 2020-08-16 DIAGNOSIS — Z1231 Encounter for screening mammogram for malignant neoplasm of breast: Secondary | ICD-10-CM

## 2020-09-04 ENCOUNTER — Encounter: Payer: Self-pay | Admitting: Allergy & Immunology

## 2020-09-04 ENCOUNTER — Other Ambulatory Visit: Payer: Self-pay

## 2020-09-04 ENCOUNTER — Ambulatory Visit (INDEPENDENT_AMBULATORY_CARE_PROVIDER_SITE_OTHER): Payer: 59 | Admitting: Allergy & Immunology

## 2020-09-04 VITALS — BP 130/88 | HR 70 | Temp 98.2°F | Ht 64.0 in | Wt 175.0 lb

## 2020-09-04 DIAGNOSIS — J4530 Mild persistent asthma, uncomplicated: Secondary | ICD-10-CM

## 2020-09-04 DIAGNOSIS — J3089 Other allergic rhinitis: Secondary | ICD-10-CM | POA: Diagnosis not present

## 2020-09-04 DIAGNOSIS — J984 Other disorders of lung: Secondary | ICD-10-CM | POA: Diagnosis not present

## 2020-09-04 DIAGNOSIS — J302 Other seasonal allergic rhinitis: Secondary | ICD-10-CM

## 2020-09-04 DIAGNOSIS — T7800XD Anaphylactic reaction due to unspecified food, subsequent encounter: Secondary | ICD-10-CM | POA: Diagnosis not present

## 2020-09-04 MED ORDER — EPINEPHRINE 0.3 MG/0.3ML IJ SOAJ: INTRAMUSCULAR | 2 refills | Status: DC

## 2020-09-04 MED ORDER — AZELASTINE HCL 0.1 % NA SOLN: 2.0000 | Freq: Two times a day (BID) | NASAL | 5 refills | Status: DC

## 2020-09-04 MED ORDER — EPINEPHRINE 0.3 MG/0.3ML IJ SOAJ: 0.3000 mg | INTRAMUSCULAR | 1 refills | Status: DC | PRN

## 2020-09-04 MED ORDER — FLUTICASONE PROPIONATE 50 MCG/ACT NA SUSP: 2.0000 | Freq: Every day | NASAL | 3 refills | Status: DC | PRN

## 2020-09-04 NOTE — Patient Instructions (Addendum)
1. Mild persistent asthma, uncomplicated - Lung testing looked stable.  - Continue with Singulair 10mg  daily. - We will continue with Proventil 4 puffs every 4-6 hours as needed for coughing/wheezing.  2. Chronic allergic rhinitis (grasses, weeds, trees, molds, dust mite, cat, and cockroach) - Continue with a nasal steroid spray such as Flonase and Azelastine up to 2 sprays per nostril once daily during the pollen season. - You have such a good handle on your symptoms.  - Continue with your OTC eye drop.    3. Adverse food reaction (shellfish)  - We will send in a new prescription for AuviQ.  - Continue to avoid shellfish for now.  4. Return in about 1 year (around 09/04/2021).    Please inform us of any Emergency Department visits, hospitalizations, or changes in symptoms. Call us before going to the ED for breathing or allergy symptoms since we might be able to fit you in for a sick visit. Feel free to contact us anytime with any questions, problems, or concerns.  It was a pleasure to see you again today!  Websites that have reliable patient information: 1. American Academy of Asthma, Allergy, and Immunology: www.aaaai.org 2. Food Allergy Research and Education (FARE): foodallergy.org 3. Mothers of Asthmatics: http://www.asthmacommunitynetwork.org 4. American College of Allergy, Asthma, and Immunology: www.acaai.org   COVID-19 Vaccine Information can be found at: ShippingScam.co.uk For questions related to vaccine distribution or appointments, please email [email or web address] or call [phone].   We realize that you might be concerned about having an allergic reaction to the COVID19 vaccines. To help with that concern, WE ARE OFFERING THE COVID19 VACCINES IN OUR OFFICE! Ask the front desk for dates!     "Like" [place] on Facebook and Instagram for our latest updates!      A healthy democracy works best when [employer]  participate! Make sure you are registered to vote! If you have moved or changed any of your contact information, you will need to get this updated before voting!  In some cases, you MAY be able to register to vote online: [email or web address]

## 2020-09-04 NOTE — Progress Notes (Signed)
FOLLOW UP  Date of Service/Encounter:  09/04/20   Assessment:   Mild persistent asthma, uncomplicated  Seasonal and perennial allergic rhinitis(grasses, weeds, trees, molds, dust mite, cat, and cockroach)  Anaphylactic shock due to food (shellfish)  Restrictive airway diseasewith stable spirometryin the past (actually improving)  Fully vaccinated to COVID19   Plan/Recommendations:   1. Mild persistent asthma, uncomplicated - Lung testing looked stable.  - Continue with Singulair 10mg  daily. - We will continue with Proventil 4 puffs every 4-6 hours as needed for coughing/wheezing.  2. Chronic allergic rhinitis (grasses, weeds, trees, molds, dust mite, cat, and cockroach) - Continue with a nasal steroid spray such as Flonase and Azelastine up to 2 sprays per nostril once daily during the pollen season. - You have such a good handle on your symptoms.  - Continue with your OTC eye drop.    3. Adverse food reaction (shellfish)  - We will send in a new prescription for AuviQ.  - Continue to avoid shellfish for now.  4. Return in about 1 year (around 09/04/2021).   Subjective:   Victoria [name] is a 60 y.o. female presenting today for follow up of  Chief Complaint  Patient presents with  . Asthma    Victoria [name] has a history of the following: Patient Active Problem List   Diagnosis Date Noted  . HTN (hypertension) 11/04/2019  . IGT (impaired glucose tolerance) 11/04/2019  . Mild persistent asthma with acute exacerbation 05/31/2018  . Adverse food reaction 05/31/2018  . Mild persistent asthma, uncomplicated 70/17/7939  . Restrictive airway disease 11/12/2017  . Anaphylactic shock due to adverse food reaction 11/12/2017  . Chronic seasonal allergic rhinitis due to fungal spores 11/12/2017  . Class 1 obesity without serious comorbidity with body mass index (BMI) of 31.0 to 31.9 in adult 10/21/2016  . Chronic constipation 10/21/2016  . Seasonal  and perennial allergic rhinitis 10/05/2007  . Asthma, mild intermittent, well-controlled 10/05/2007    History obtained from: chart review and patient.  Victoria [name] is a 60 y.o. female presenting for a follow up visit.  She was last seen in March 2021.  At that time, her lung testing looks stable.  We continue with Singulair 10 mg daily and albuterol as needed.  For chronic allergic rhinitis, we continued with a nasal steroid as well as Astelin up to 2 sprays per nostril once daily.  We recommended continued avoidance of shellfish.  She recently had COVID-19 and seemed to have recovered.  Since last visit, she has done well.   Asthma/Respiratory Symptom History: She is using her albuterol prior to physical activity. She does remains on her Singulair.  Victoria [name]'s asthma has been well controlled. She has not required rescue medication, experienced nocturnal awakenings due to lower respiratory symptoms, nor have activities of daily living been limited. She has required no Emergency Department or Urgent Care visits for her asthma. She has required zero courses of systemic steroids for asthma exacerbations since the last visit. ACT score today is 24, indicating excellent asthma symptom control.    Allergic Rhinitis Symptom History: She is usuing her nasal sprays now since the allergy seasona has started. She stops them from November through January. She has not needed antibiotics at all.  Food Allergy Symptom History: She continues to avoid seafood.  She does need a new epinephrine autoinjector.  She is not interested in any testing.  Otherwise, there have been no changes to her past medical history, surgical history, family history, or social  history.    Review of Systems  Constitutional: Negative.  Negative for chills, fever, malaise/fatigue and weight loss.  HENT: Positive for congestion. Negative for ear discharge, ear pain and sinus pain.   Eyes: Negative for pain, discharge and redness.  Respiratory:  Positive for cough. Negative for sputum production, shortness of breath and wheezing.   Cardiovascular: Negative.  Negative for chest pain and palpitations.  Gastrointestinal: Negative for abdominal pain, constipation, diarrhea, heartburn, nausea and vomiting.  Skin: Negative.  Negative for itching and rash.  Neurological: Negative for dizziness and headaches.  Endo/Heme/Allergies: Positive for environmental allergies. Does not bruise/bleed easily.       Objective:   Blood pressure 130/88, pulse 70, temperature 98.2 F (36.8 C), height 5\' 4"  (1.626 m), weight 175 lb (79.4 kg), last menstrual period 03/30/2012, SpO2 96 %. Body mass index is 30.04 kg/m.   Physical Exam:  Physical Exam Constitutional:      Appearance: She is well-developed.     Comments: Very pleasant interactive female.  HENT:     Head: Normocephalic and atraumatic.     Right Ear: Tympanic membrane, ear canal and external ear normal.     Left Ear: Tympanic membrane, ear canal and external ear normal.     Nose: No nasal deformity, septal deviation, mucosal edema, rhinorrhea or epistaxis.     Right Turbinates: Enlarged and swollen.     Left Turbinates: Enlarged and swollen.     Right Sinus: No maxillary sinus tenderness or frontal sinus tenderness.     Left Sinus: No maxillary sinus tenderness or frontal sinus tenderness.     Mouth/Throat:     Mouth: Oropharynx is clear and moist. Mucous membranes are not pale and not dry.     Pharynx: Uvula midline.  Eyes:     General:        Right eye: No discharge.        Left eye: No discharge.     Extraocular Movements: EOM normal.     Conjunctiva/sclera: Conjunctivae normal.     Right eye: Right conjunctiva is not injected. No chemosis.    Left eye: Left conjunctiva is not injected. No chemosis.    Pupils: Pupils are equal, round, and reactive to light.  Cardiovascular:     Rate and Rhythm: Normal rate and regular rhythm.     Heart sounds: Normal heart sounds.   Pulmonary:     Effort: Pulmonary effort is normal. No tachypnea, accessory muscle usage or respiratory distress.     Breath sounds: Normal breath sounds. No wheezing, rhonchi or rales.     Comments: Moving air well in all lung fields.  No increased work of breathing. Chest:     Chest wall: No tenderness.  Lymphadenopathy:     Cervical: No cervical adenopathy.  Skin:    General: Skin is warm.     Capillary Refill: Capillary refill takes less than 2 seconds.     Coloration: Skin is not pale.     Findings: No abrasion, erythema, petechiae or rash. Rash is not papular, urticarial or vesicular.     Comments: No eczematous or urticarial lesions noted.  Neurological:     Mental Status: She is alert.  Psychiatric:        Mood and Affect: Mood and affect normal.      Diagnostic studies:    Spirometry: results abnormal (FEV1: 1.21/56%, FVC: 2.14/78%, FEV1/FVC: 57%).    Spirometry consistent with possible restrictive disease. Overall values are slightly better than they  were previously.   Allergy Studies: none     Salvatore Marvel, MD  [facility]

## 2020-09-20 ENCOUNTER — Telehealth: Payer: Self-pay | Admitting: Internal Medicine

## 2020-09-20 NOTE — Telephone Encounter (Signed)
Patient has a concern about the recall on her BP medication   hydrochlorothiazide (HYDRODIURIL) 25 MG tablet  Please advise

## 2020-09-20 NOTE — Telephone Encounter (Signed)
Advised patient to check with her pharmacist about more information concerning HCTZ

## 2020-10-08 ENCOUNTER — Other Ambulatory Visit: Payer: Self-pay | Admitting: Allergy & Immunology

## 2020-12-21 ENCOUNTER — Other Ambulatory Visit: Payer: Self-pay | Admitting: *Deleted

## 2020-12-21 MED ORDER — ALBUTEROL SULFATE HFA 108 (90 BASE) MCG/ACT IN AERS: INHALATION_SPRAY | RESPIRATORY_TRACT | 1 refills | Status: DC

## 2020-12-21 NOTE — Telephone Encounter (Signed)
Pt wants to know if there is something else she can take other than Benadryl. Pt states it makes her sleepy

## 2020-12-21 NOTE — Telephone Encounter (Signed)
Please advise 

## 2020-12-27 NOTE — Telephone Encounter (Signed)
Called patient and she is going to try the allegra 1 tablet twice daily per your recommendation.

## 2021-01-02 NOTE — Telephone Encounter (Signed)
Noted.  Victoria Marvel, MD [facility]

## 2021-02-05 ENCOUNTER — Other Ambulatory Visit: Payer: Self-pay

## 2021-02-06 ENCOUNTER — Encounter: Payer: Self-pay | Admitting: Internal Medicine

## 2021-02-06 ENCOUNTER — Ambulatory Visit (INDEPENDENT_AMBULATORY_CARE_PROVIDER_SITE_OTHER): Payer: 59 | Admitting: Internal Medicine

## 2021-02-06 VITALS — BP 110/80 | HR 70 | Temp 98.2°F | Ht 64.0 in | Wt 183.3 lb

## 2021-02-06 DIAGNOSIS — J4530 Mild persistent asthma, uncomplicated: Secondary | ICD-10-CM

## 2021-02-06 DIAGNOSIS — Z0000 Encounter for general adult medical examination without abnormal findings: Secondary | ICD-10-CM

## 2021-02-06 DIAGNOSIS — I10 Essential (primary) hypertension: Secondary | ICD-10-CM | POA: Diagnosis not present

## 2021-02-06 DIAGNOSIS — R7302 Impaired glucose tolerance (oral): Secondary | ICD-10-CM

## 2021-02-06 LAB — LIPID PANEL
Cholesterol: 273 mg/dL — ABNORMAL HIGH (ref 0–200)
HDL: 140 mg/dL (ref >39.00)
LDL Cholesterol: 107 mg/dL — ABNORMAL HIGH (ref 0–99)
NonHDL: 133.37
Total CHOL/HDL Ratio: 2
Triglycerides: 131 mg/dL (ref 0.0–149.0)
VLDL: 26.2 mg/dL (ref 0.0–40.0)

## 2021-02-06 LAB — CBC WITH DIFFERENTIAL/PLATELET
Basophils Absolute: 0 10*3/uL (ref 0.0–0.1)
Basophils Relative: 0.7 % (ref 0.0–3.0)
Eosinophils Absolute: 0.1 10*3/uL (ref 0.0–0.7)
Eosinophils Relative: 3.6 % (ref 0.0–5.0)
HCT: 37.9 % (ref 36.0–46.0)
Hemoglobin: 12.5 g/dL (ref 12.0–15.0)
Lymphocytes Relative: 35.2 % (ref 12.0–46.0)
Lymphs Abs: 1.4 10*3/uL (ref 0.7–4.0)
MCHC: 33.1 g/dL (ref 30.0–36.0)
MCV: 86.1 fl (ref 78.0–100.0)
Monocytes Absolute: 0.2 10*3/uL (ref 0.1–1.0)
Monocytes Relative: 5.4 % (ref 3.0–12.0)
Neutro Abs: 2.2 10*3/uL (ref 1.4–7.7)
Neutrophils Relative %: 55.1 % (ref 43.0–77.0)
Platelets: 204 10*3/uL (ref 150.0–400.0)
RBC: 4.41 Mil/uL (ref 3.87–5.11)
RDW: 15.1 % (ref 11.5–15.5)
WBC: 4 10*3/uL (ref 4.0–10.5)

## 2021-02-06 LAB — COMPREHENSIVE METABOLIC PANEL
ALT: 13 U/L (ref 0–35)
AST: 17 U/L (ref 0–37)
Albumin: 4.3 g/dL (ref 3.5–5.2)
Alkaline Phosphatase: 52 U/L (ref 39–117)
BUN: 11 mg/dL (ref 6–23)
CO2: 31 mEq/L (ref 19–32)
Calcium: 9.8 mg/dL (ref 8.4–10.5)
Chloride: 102 mEq/L (ref 96–112)
Creatinine, Ser: 0.85 mg/dL (ref 0.40–1.20)
GFR: 75.49 mL/min (ref >60.00)
Glucose, Bld: 105 mg/dL — ABNORMAL HIGH (ref 70–99)
Potassium: 3.6 mEq/L (ref 3.5–5.1)
Sodium: 143 mEq/L (ref 135–145)
Total Bilirubin: 0.5 mg/dL (ref 0.2–1.2)
Total Protein: 6.9 g/dL (ref 6.0–8.3)

## 2021-02-06 LAB — TSH: TSH: 1.52 u[IU]/mL (ref 0.35–5.50)

## 2021-02-06 LAB — VITAMIN D 25 HYDROXY (VIT D DEFICIENCY, FRACTURES): VITD: 38.11 ng/mL (ref 30.00–100.00)

## 2021-02-06 LAB — VITAMIN B12: Vitamin B-12: 367 pg/mL (ref 211–911)

## 2021-02-06 LAB — HEMOGLOBIN A1C: Hgb A1c MFr Bld: 6.3 % (ref 4.6–6.5)

## 2021-02-06 NOTE — Addendum Note (Signed)
Addended by: Elmer Picker on: 02/06/2021 07:54 AM   Modules accepted: Orders

## 2021-02-06 NOTE — Patient Instructions (Signed)
-  Nice seeing you today!!  -Lab work today; will notify you once results are available.  -Schedule follow up in 6 months. 

## 2021-02-06 NOTE — Progress Notes (Signed)
Established Patient Office Visit     This visit occurred during the SARS-CoV-2 public health emergency.  Safety protocols were in place, including screening questions prior to the visit, additional usage of staff PPE, and extensive cleaning of exam room while observing appropriate contact time as indicated for disinfecting solutions.    CC/Reason for Visit: Annual preventive exam  HPI: Victoria [name] is a 60 y.o. female who is coming in today for the above mentioned reasons. Past Medical History is significant for: Obesity, impaired glucose tolerance, hypertension, mild intermittent asthma.  She has no acute concerns or complaints today.  All her including all 4 COVID vaccines.  She had a colonoscopy in 2014, a mammogram in February 2022 and a negative Pap smear in January with her GYN.  She has routine eye and dental care.   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   Asthma    Fibroids, subserous    GERD (gastroesophageal reflux disease)    Headache(784.0)    tension- not an issue   Hypertension    IGT (impaired glucose tolerance) 11/04/2019   Torn meniscus 04/2020   right   Urticaria     Past Surgical History:  Procedure Laterality Date   BALLOON DILATION N/A 11/19/2015   Procedure: BALLOON DILATION;  Surgeon: Garlan Fair, MD;  Location: [facility];  Service: Endoscopy;  Laterality: N/A;   BUNIONECTOMY Bilateral    COLONOSCOPY WITH PROPOFOL N/A 10/05/2012   Procedure: COLONOSCOPY WITH PROPOFOL;  Surgeon: Garlan Fair, MD;  Location: [facility];  Service: Endoscopy;  Laterality: N/A;   ESOPHAGOGASTRODUODENOSCOPY (EGD) WITH PROPOFOL N/A 11/19/2015   Procedure: ESOPHAGOGASTRODUODENOSCOPY (EGD) WITH PROPOFOL;  Surgeon: Garlan Fair, MD;  Location: [facility];  Service: Endoscopy;  Laterality: N/A;   TUBAL LIGATION  27 years ago    Social  History:  reports that she has never smoked. She has never used smokeless tobacco. She reports that she does not drink alcohol and does not use drugs.  Allergies: Allergies  Allergen Reactions   Shellfish Allergy Shortness Of Breath and Itching   Sulfonamide Derivatives Itching, Swelling and Other (See Comments)    Throat swells   Nasonex [Mometasone Furoate] Other (See Comments)    Nose bleeds    Family History:  Family History  Problem Relation Age of Onset   Emphysema Father    Lung cancer Father    High Cholesterol Mother    Osteoporosis Mother    Hypertension Mother    Migraines Daughter    Hypertension Son        exercise   Asthma Son    Hypertension Sister    COPD Other    Breast cancer Neg Hx    Allergic rhinitis Neg Hx    Angioedema Neg Hx    Atopy Neg Hx    Eczema Neg Hx    Immunodeficiency Neg Hx    Urticaria Neg Hx      Current Outpatient Medications:    albuterol (PROVENTIL HFA) 108 (90 Base) MCG/ACT inhaler, Inhale two puffs every 4-6 hours if needed for cough or wheeze, Disp: 18 g, Rfl: 1   azelastine (ASTELIN) 0.1 % nasal spray, Place 2 sprays into both nostrils 2 (two) times daily. Use in each nostril as directed, Disp: 30 mL, Rfl: 5   calcium carbonate (OSCAL) 1500 (600 Ca) MG  TABS tablet, Take by mouth 2 (two) times daily with a meal., Disp: , Rfl:    cetirizine (ZYRTEC) 10 MG tablet, Take 10 mg by mouth as needed for allergies. , Disp: , Rfl:    cholecalciferol (VITAMIN D3) 25 MCG (1000 UNIT) tablet, Take 1,000 Units by mouth daily., Disp: , Rfl:    diphenhydrAMINE (BENADRYL) 25 MG tablet, Take 25 mg by mouth at bedtime as needed for allergies or sleep. , Disp: , Rfl:    docusate sodium (COLACE) 100 MG capsule, Take 100 mg by mouth daily., Disp: , Rfl:    EPINEPHrine (AUVI-Q) 0.3 mg/0.3 mL IJ SOAJ injection, Use as directed for severe allergic reaction, Disp: 2 each, Rfl: 2   fluticasone (CUTIVATE) 0.05 % cream, fluticasone propionate 0.05 % topical  cream  APPLY SMALL AMOUNT TOPICALLY TO THE AFFECTED AREA TWICE DAILY AS NEEDED, Disp: , Rfl:    fluticasone (FLONASE) 50 MCG/ACT nasal spray, Place 2 sprays into both nostrils daily as needed for allergies., Disp: 16 g, Rfl: 3   hydrochlorothiazide (HYDRODIURIL) 25 MG tablet, TAKE 1 TABLET DAILY (LAST REFILL, NEED APPOINTMENT), Disp: 90 tablet, Rfl: 3   Magnesium 200 MG TABS, Take by mouth., Disp: , Rfl:    meloxicam (MOBIC) 7.5 MG tablet, Take 1 tablet by mouth 2 (two) times daily., Disp: , Rfl:    montelukast (SINGULAIR) 10 MG tablet, TAKE 1 TABLET AT BEDTIME, Disp: 90 tablet, Rfl: 2   Multiple Vitamin (MULTIVITAMIN) tablet, Take 1 tablet by mouth daily., Disp: , Rfl:    polyethylene glycol (MIRALAX / GLYCOLAX) packet, Take 17 g by mouth daily., Disp: , Rfl:    triamcinolone (KENALOG) 0.1 %, triamcinolone acetonide 0.1 % topical cream  APPLY SMALL AMOUNT TOPICALLY TO THE AFFECTED AREA TWICE DAILY AS NEEDED, Disp: , Rfl:    Turmeric 500 MG CAPS, Take by mouth., Disp: , Rfl:   Review of Systems:  Constitutional: Denies fever, chills, diaphoresis, appetite change and fatigue.  HEENT: Denies photophobia, eye pain, redness, hearing loss, ear pain, congestion, sore throat, rhinorrhea, sneezing, mouth sores, trouble swallowing, neck pain, neck stiffness and tinnitus.   Respiratory: Denies SOB, DOE, cough, chest tightness,  and wheezing.   Cardiovascular: Denies chest pain, palpitations and leg swelling.  Gastrointestinal: Denies nausea, vomiting, abdominal pain, diarrhea, constipation, blood in stool and abdominal distention.  Genitourinary: Denies dysuria, urgency, frequency, hematuria, flank pain and difficulty urinating.  Endocrine: Denies: hot or cold intolerance, sweats, changes in hair or nails, polyuria, polydipsia. Musculoskeletal: Denies myalgias, back pain, joint swelling, arthralgias and gait problem.  Skin: Denies pallor, rash and wound.  Neurological: Denies dizziness, seizures, syncope,  weakness, light-headedness, numbness and headaches.  Hematological: Denies adenopathy. Easy bruising, personal or family bleeding history  Psychiatric/Behavioral: Denies suicidal ideation, mood changes, confusion, nervousness, sleep disturbance and agitation    Physical Exam: Vitals:   02/06/21 0725  BP: 110/80  Pulse: 70  Temp: 98.2 F (36.8 C)  TempSrc: Oral  SpO2: 97%  Weight: 183 lb 4.8 oz (83.1 kg)  Height: '5\' 4"'$  (1.626 m)    Body mass index is 31.46 kg/m.   Constitutional: NAD, calm, comfortable Eyes: PERRL, lids and conjunctivae normal, wears corrective lenses ENMT: Mucous membranes are moist. Posterior pharynx clear of any exudate or lesions. Normal dentition. Tympanic membrane is pearly white, no erythema or bulging. Neck: normal, supple, no masses, no thyromegaly Respiratory: clear to auscultation bilaterally, no wheezing, no crackles. Normal respiratory effort. No accessory muscle use.  Cardiovascular: Regular rate and rhythm,  no murmurs / rubs / gallops. No extremity edema. 2+ pedal pulses. No carotid bruits.  Abdomen: no tenderness, no masses palpated. No hepatosplenomegaly. Bowel sounds positive.  Musculoskeletal: no clubbing / cyanosis. No joint deformity upper and lower extremities. Good ROM, no contractures. Normal muscle tone.  Skin: no rashes, lesions, ulcers. No induration Neurologic: CN 2-12 grossly intact. Sensation intact, DTR normal. Strength 5/5 in all 4.  Psychiatric: Normal judgment and insight. Alert and oriented x 3. Normal mood.    Impression and Plan:  Encounter for preventive health examination -She has routine eye and dental care. -All immunizations are up-to-date and age-appropriate. -Laboratory review updated today. -Healthy lifestyle discussed in detail. -She had a colonoscopy in 2014 and is a 10-year callback. -She had a negative Pap smear in January 2022 with her GYN Dr. Sharen Counter. -She had a negative mammogram in February  2022.  Primary hypertension  - Plan: CBC with Differential/Platelet, Comprehensive metabolic panel, Lipid panel, TSH, Vitamin B12, VITAMIN D 25 Hydroxy (Vit-D Deficiency, Fractures) -Excellent control today on hydrochlorothiazide.  IGT (impaired glucose tolerance)  - Plan: Hemoglobin A1c  Mild persistent asthma, uncomplicated -Stable, no recent flareups.    Patient Instructions  -Nice seeing you today!!  -Lab work today; will notify you once results are available.  -Schedule follow up in 6 months.      Lelon Frohlich, MD [facility]

## 2021-04-09 ENCOUNTER — Other Ambulatory Visit: Payer: Self-pay | Admitting: Internal Medicine

## 2021-04-09 DIAGNOSIS — I10 Essential (primary) hypertension: Secondary | ICD-10-CM

## 2021-06-20 NOTE — Progress Notes (Signed)
60 y.o. [id number] Married [place] American female here for annual exam.    No bleeding or spotting.   PCP:  Lelon Frohlich, MD   Patient's last menstrual period was 03/30/2012.           Sexually active: Yes.    The current method of family planning is tubal ligation/PMP.    Exercising: No.  The patient does not participate in regular exercise at present. Smoker:  no  Health Maintenance: Pap:  06-11-20 Neg:Neg HR HPV, 04-16-17 Neg:Neg HR HPV, 12-12-13 Neg:Neg HR HPV History of abnormal Pap:  yes, Hx LGSIL in 2010 MMG:  08-16-20 Neg/BiRads1.   Has appointment.  Colonoscopy:   08/2012 normal;next 10 years BMD:   n/a  Result  n/a TDaP:  08-23-16 Td Gardasil:   no HIV: ?Neg in preg Hep C: 10-21-16 Neg Screening Labs:  PCP.  Flu vaccine:  completed.  Covid booster:  x 2.    reports that she has never smoked. She has never used smokeless tobacco. She reports that she does not drink alcohol and does not use drugs.  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   Asthma    Fibroids, subserous    GERD (gastroesophageal reflux disease)    Headache(784.0)    tension- not an issue   Hypertension    IGT (impaired glucose tolerance) 11/04/2019   Torn meniscus 04/2020   right   Urticaria     Past Surgical History:  Procedure Laterality Date   BALLOON DILATION N/A 11/19/2015   Procedure: BALLOON DILATION;  Surgeon: Garlan Fair, MD;  Location: [facility];  Service: Endoscopy;  Laterality: N/A;   BUNIONECTOMY Bilateral    COLONOSCOPY WITH PROPOFOL N/A 10/05/2012   Procedure: COLONOSCOPY WITH PROPOFOL;  Surgeon: Garlan Fair, MD;  Location: [facility];  Service: Endoscopy;  Laterality: N/A;   ESOPHAGOGASTRODUODENOSCOPY (EGD) WITH PROPOFOL N/A 11/19/2015   Procedure: ESOPHAGOGASTRODUODENOSCOPY (EGD) WITH PROPOFOL;  Surgeon: Garlan Fair, MD;  Location: [facility];  Service: Endoscopy;   Laterality: N/A;   TUBAL LIGATION  27 years ago    Current Outpatient Medications  Medication Sig Dispense Refill   albuterol (PROVENTIL HFA) 108 (90 Base) MCG/ACT inhaler Inhale two puffs every 4-6 hours if needed for cough or wheeze 18 g 1   azelastine (ASTELIN) 0.1 % nasal spray Place 2 sprays into both nostrils 2 (two) times daily. Use in each nostril as directed 30 mL 5   calcium carbonate (OSCAL) 1500 (600 Ca) MG TABS tablet Take by mouth 2 (two) times daily with a meal.     cetirizine (ZYRTEC) 10 MG tablet Take 10 mg by mouth as needed for allergies.      cholecalciferol (VITAMIN D3) 25 MCG (1000 UNIT) tablet Take 1,000 Units by mouth daily.     diphenhydrAMINE (BENADRYL) 25 MG tablet Take 25 mg by mouth at bedtime as needed for allergies or sleep.      docusate sodium (COLACE) 100 MG capsule Take 100 mg by mouth daily.     EPINEPHrine (AUVI-Q) 0.3 mg/0.3 mL IJ SOAJ injection Use as directed for severe allergic reaction 2 each 2   fluticasone (CUTIVATE) 0.05 % cream fluticasone propionate 0.05 % topical cream  APPLY SMALL AMOUNT TOPICALLY TO THE AFFECTED AREA TWICE DAILY AS NEEDED     fluticasone (FLONASE) 50 MCG/ACT nasal spray Place 2 sprays into both nostrils  daily as needed for allergies. 16 g 3   hydrochlorothiazide (HYDRODIURIL) 25 MG tablet TAKE 1 TABLET DAILY (LAST REFILL, NEED APPOINTMENT) 90 tablet 3   lidocaine (XYLOCAINE) 2 % solution Use as directed 15 mLs in the mouth or throat as needed for mouth pain. 100 mL 0   Magnesium 200 MG TABS Take by mouth.     montelukast (SINGULAIR) 10 MG tablet TAKE 1 TABLET AT BEDTIME 90 tablet 2   Multiple Vitamin (MULTIVITAMIN) tablet Take 1 tablet by mouth daily.     polyethylene glycol (MIRALAX / GLYCOLAX) packet Take 17 g by mouth daily.     triamcinolone (KENALOG) 0.1 % triamcinolone acetonide 0.1 % topical cream  APPLY SMALL AMOUNT TOPICALLY TO THE AFFECTED AREA TWICE DAILY AS NEEDED     Turmeric 500 MG CAPS Take by mouth.     No  current facility-administered medications for this visit.    Family History  Problem Relation Age of Onset   Emphysema Father    Lung cancer Father    High Cholesterol Mother    Osteoporosis Mother    Hypertension Mother    Migraines Daughter    Hypertension Son        exercise   Asthma Son    Hypertension Sister    COPD Other    Breast cancer Neg Hx    Allergic rhinitis Neg Hx    Angioedema Neg Hx    Atopy Neg Hx    Eczema Neg Hx    Immunodeficiency Neg Hx    Urticaria Neg Hx     Review of Systems  All other systems reviewed and are negative.  Exam:   BP 130/82    Pulse 87    Ht 5' 3.5" (1.613 m)    Wt 192 lb (87.1 kg)    LMP 03/30/2012 Comment: Dr Cathie Olden - GYN   SpO2 99%    BMI 33.48 kg/m     General appearance: alert, cooperative and appears stated age Head: normocephalic, without obvious abnormality, atraumatic Neck: no adenopathy, supple, symmetrical, trachea midline and thyroid normal to inspection and palpation Lungs: clear to auscultation bilaterally Breasts: normal appearance, no masses or tenderness, No nipple retraction or dimpling, No nipple discharge or bleeding, No axillary adenopathy Heart: regular rate and rhythm Abdomen: umbilical hernia - reducible.  Abdomen is soft, non-tender; no masses, no organomegaly Extremities: extremities normal, atraumatic, no cyanosis or edema Skin: skin color, texture, turgor normal. No rashes or lesions Lymph nodes: cervical, supraclavicular, and axillary nodes normal. Neurologic: grossly normal  Pelvic: External genitalia:  no lesions              No abnormal inguinal nodes palpated.              Urethra:  normal appearing urethra with no masses, tenderness or lesions              Bartholins and Skenes: normal                 Vagina: normal appearing vagina with normal color and some white clumpy discharge, no lesions (no symptoms of discharge or irritation per patient).              Cervix: no lesions              Pap  taken: no Bimanual Exam:  Uterus:  normal size, contour, position, consistency, mobility, non-tender              Adnexa: no mass,  fullness, tenderness              Rectal exam: yes.  Confirms.              Anus:  normal sphincter tone, no lesions  Chaperone was present for exam:  Estill Bamberg, CMA  Assessment:   Well woman visit with gynecologic exam. Remote hx LGSIL.  Umbilical hernia.   Plan: Mammogram screening discussed. Self breast awareness encouraged.  Pap and HR HPV 2026. Guidelines for Calcium, Vitamin D, regular exercise program including cardiovascular and weight bearing exercise. Labs with PCP. She will try over the counter Monistat if she develops symptoms of vaginal itching and irritation.  Follow up annually and prn.   After visit summary provided.

## 2021-06-24 ENCOUNTER — Other Ambulatory Visit: Payer: Self-pay

## 2021-06-24 ENCOUNTER — Encounter (HOSPITAL_COMMUNITY): Payer: Self-pay | Admitting: Emergency Medicine

## 2021-06-24 ENCOUNTER — Ambulatory Visit (HOSPITAL_COMMUNITY)
Admission: EM | Admit: 2021-06-24 | Discharge: 2021-06-24 | Disposition: A | Discharge status: Home / Self Care | Payer: 59 | Attending: Student | Admitting: Student

## 2021-06-24 DIAGNOSIS — J069 Acute upper respiratory infection, unspecified: Secondary | ICD-10-CM

## 2021-06-24 DIAGNOSIS — J4541 Moderate persistent asthma with (acute) exacerbation: Secondary | ICD-10-CM

## 2021-06-24 MED ORDER — PREDNISONE 20 MG PO TABS: 40.0000 mg | ORAL_TABLET | Freq: Every day | ORAL | 0 refills | Status: AC

## 2021-06-24 MED ORDER — LIDOCAINE VISCOUS HCL 2 % MT SOLN: 15.0000 mL | OROMUCOSAL | 0 refills | Status: DC | PRN

## 2021-06-24 NOTE — ED Provider Notes (Signed)
[place]    CSN: [id number] Arrival date & time: 06/24/21  0277      History   Chief Complaint Chief Complaint  Patient presents with   Sore Throat   Cough   Chills    HPI Victoria [name] is a 60 y.o. female presenting with viral syndrome. Symptoms x3 days, worse x1 day. Grandson with cold. Using albuterol prn with some temporary relief- every 4 hours. Singulair daily. Does not use steroid inhaler. Subjective chills, hasn't monitored temperature at home. Generalized bodyaches. Has tried theraflu cold liquid and mucous relief medication and tylenol for headaches. Throat numbing spray. Some SOB with coughing and ambulation. Denies current SOB, CP, dizziness. No shortness of breath today.  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   Asthma    Fibroids, subserous    GERD (gastroesophageal reflux disease)    Headache(784.0)    tension- not an issue   Hypertension    IGT (impaired glucose tolerance) 11/04/2019   Torn meniscus 04/2020   right   Urticaria     Patient Active Problem List   Diagnosis Date Noted   HTN (hypertension) 11/04/2019   IGT (impaired glucose tolerance) 11/04/2019   Mild persistent asthma with acute exacerbation 05/31/2018   Adverse food reaction 05/31/2018   Mild persistent asthma, uncomplicated 41/28/7867   Restrictive airway disease 11/12/2017   Anaphylactic shock due to adverse food reaction 11/12/2017   Chronic seasonal allergic rhinitis due to fungal spores 11/12/2017   Class 1 obesity without serious comorbidity with body mass index (BMI) of 31.0 to 31.9 in adult 10/21/2016   Chronic constipation 10/21/2016   Seasonal and perennial allergic rhinitis 10/05/2007   Asthma, mild intermittent, well-controlled 10/05/2007    Past Surgical History:  Procedure Laterality Date   BALLOON DILATION N/A 11/19/2015   Procedure: BALLOON DILATION;   Surgeon: Garlan Fair, MD;  Location: [facility];  Service: Endoscopy;  Laterality: N/A;   BUNIONECTOMY Bilateral    COLONOSCOPY WITH PROPOFOL N/A 10/05/2012   Procedure: COLONOSCOPY WITH PROPOFOL;  Surgeon: Garlan Fair, MD;  Location: [facility];  Service: Endoscopy;  Laterality: N/A;   ESOPHAGOGASTRODUODENOSCOPY (EGD) WITH PROPOFOL N/A 11/19/2015   Procedure: ESOPHAGOGASTRODUODENOSCOPY (EGD) WITH PROPOFOL;  Surgeon: Garlan Fair, MD;  Location: [facility];  Service: Endoscopy;  Laterality: N/A;   TUBAL LIGATION  27 years ago    OB History     Gravida  2   Para  2   Term  2   Preterm      AB      Living  2      SAB      IAB      Ectopic      Multiple      Live Births  1            Home Medications    Prior to Admission medications   Medication Sig Start Date End Date Taking? Authorizing Provider  lidocaine (XYLOCAINE) 2 % solution Use as directed 15 mLs in the mouth or throat as needed for mouth pain. 06/24/21  Yes Hazel Sams, PA-C  predniSONE (DELTASONE) 20 MG tablet Take 2 tablets (40 mg total) by mouth daily for 5 days. Take with breakfast or lunch. Avoid NSAIDs (ibuprofen, etc) while taking this medication. 06/24/21 06/29/21 Yes Hazel Sams, PA-C  albuterol (PROVENTIL HFA)  108 (90 Base) MCG/ACT inhaler Inhale two puffs every 4-6 hours if needed for cough or wheeze 12/21/20   Valentina Shaggy, MD  azelastine (ASTELIN) 0.1 % nasal spray Place 2 sprays into both nostrils 2 (two) times daily. Use in each nostril as directed 09/04/20   Valentina Shaggy, MD  calcium carbonate (OSCAL) 1500 (600 Ca) MG TABS tablet Take by mouth 2 (two) times daily with a meal.    [provider]  cetirizine (ZYRTEC) 10 MG tablet Take 10 mg by mouth as needed for allergies.     [provider]  cholecalciferol (VITAMIN D3) 25 MCG (1000 UNIT) tablet Take 1,000 Units by mouth daily.    [provider]  diphenhydrAMINE (BENADRYL)  25 MG tablet Take 25 mg by mouth at bedtime as needed for allergies or sleep.     [provider]  docusate sodium (COLACE) 100 MG capsule Take 100 mg by mouth daily.    [provider]  EPINEPHrine (AUVI-Q) 0.3 mg/0.3 mL IJ SOAJ injection Use as directed for severe allergic reaction 09/04/20   Valentina Shaggy, MD  fluticasone (CUTIVATE) 0.05 % cream fluticasone propionate 0.05 % topical cream  APPLY SMALL AMOUNT TOPICALLY TO THE AFFECTED AREA TWICE DAILY AS NEEDED    [provider]  fluticasone (FLONASE) 50 MCG/ACT nasal spray Place 2 sprays into both nostrils daily as needed for allergies. 09/04/20   Valentina Shaggy, MD  hydrochlorothiazide (HYDRODIURIL) 25 MG tablet TAKE 1 TABLET DAILY (LAST REFILL, NEED APPOINTMENT) 04/09/21   Isaac Bliss, Rayford Halsted, MD  Magnesium 200 MG TABS Take by mouth.    [provider]  meloxicam (MOBIC) 7.5 MG tablet Take 1 tablet by mouth 2 (two) times daily.    [provider]  montelukast (SINGULAIR) 10 MG tablet TAKE 1 TABLET AT BEDTIME 10/08/20   Valentina Shaggy, MD  Multiple Vitamin (MULTIVITAMIN) tablet Take 1 tablet by mouth daily.    [provider]  polyethylene glycol (MIRALAX / GLYCOLAX) packet Take 17 g by mouth daily.    [provider]  triamcinolone (KENALOG) 0.1 % triamcinolone acetonide 0.1 % topical cream  APPLY SMALL AMOUNT TOPICALLY TO THE AFFECTED AREA TWICE DAILY AS NEEDED    [provider]  Turmeric 500 MG CAPS Take by mouth.    [provider]    Family History Family History  Problem Relation Age of Onset   Emphysema Father    Lung cancer Father    High Cholesterol Mother    Osteoporosis Mother    Hypertension Mother    Migraines Daughter    Hypertension Son        exercise   Asthma Son    Hypertension Sister    COPD Other    Breast cancer Neg Hx    Allergic rhinitis Neg Hx    Angioedema Neg Hx    Atopy Neg Hx    Eczema Neg Hx     Immunodeficiency Neg Hx    Urticaria Neg Hx     Social History Social History   Tobacco Use   Smoking status: Never   Smokeless tobacco: Never  Vaping Use   Vaping Use: Never used  Substance Use Topics   Alcohol use: No   Drug use: No     Allergies   Shellfish allergy, Sulfonamide derivatives, and Nasonex [mometasone furoate]   Review of Systems Review of Systems  Constitutional:  Negative for appetite change, chills and fever.  HENT:  Positive for congestion. Negative for ear pain, rhinorrhea, sinus pressure, sinus pain and sore throat.   Eyes:  Negative for redness and visual disturbance.  Respiratory:  Positive for cough and shortness of breath. Negative for chest tightness and wheezing.   Cardiovascular:  Negative for chest pain and palpitations.  Gastrointestinal:  Negative for abdominal pain, constipation, diarrhea, nausea and vomiting.  Genitourinary:  Negative for dysuria, frequency and urgency.  Musculoskeletal:  Negative for myalgias.  Neurological:  Negative for dizziness, weakness and headaches.  Psychiatric/Behavioral:  Negative for confusion.   All other systems reviewed and are negative.   Physical Exam Triage Vital Signs ED Triage Vitals [06/24/21 0934]  Enc Vitals Group     BP (!) 130/94     Pulse Rate 67     Resp 17     Temp 98.8 F (37.1 C)     Temp Source Oral     SpO2 98 %     Weight      Height      Head Circumference      Peak Flow      Pain Score      Pain Loc      Pain Edu?      Excl. in [place]?    No data found.  Updated Vital Signs BP (!) 130/94 (BP Location: Right Arm)    Pulse 67    Temp 98.8 F (37.1 C) (Oral)    Resp 17    LMP 03/30/2012 Comment: Dr Cathie Olden - GYN   SpO2 98%   Visual Acuity Right Eye Distance:   Left Eye Distance:   Bilateral Distance:    Right Eye Near:   Left Eye Near:    Bilateral Near:     Physical Exam Vitals reviewed.  Constitutional:      General: She is not in acute distress.     Appearance: Normal appearance. She is not ill-appearing.  HENT:     Head: Normocephalic and atraumatic.     Right Ear: Tympanic membrane, ear canal and external ear normal. No tenderness. No middle ear effusion. There is no impacted cerumen. Tympanic membrane is not perforated, erythematous, retracted or bulging.     Left Ear: Tympanic membrane, ear canal and external ear normal. No tenderness.  No middle ear effusion. There is no impacted cerumen. Tympanic membrane is not perforated, erythematous, retracted or bulging.     Nose: Nose normal. No congestion.     Mouth/Throat:     Mouth: Mucous membranes are moist.     Pharynx: Uvula midline. Posterior oropharyngeal erythema present. No oropharyngeal exudate.     Tonsils: No tonsillar exudate. 1+ on the right. 1+ on the left.     Comments: Tonsils 1+ bilaterally without exudate. On exam, uvula is midline, she is tolerating her secretions without difficulty, there is no trismus, no drooling, she has normal phonation  Eyes:     Extraocular Movements: Extraocular movements intact.     Pupils: Pupils are equal, round, and reactive to light.  Cardiovascular:     Rate and Rhythm: Normal rate and regular rhythm.     Heart sounds: Normal heart sounds.  Pulmonary:     Effort: Pulmonary effort is normal.     Breath sounds: Normal breath sounds. No decreased breath sounds, wheezing, rhonchi or rales.  Abdominal:     Palpations: Abdomen is soft.     Tenderness: There is no abdominal tenderness. There is no guarding or rebound.  Lymphadenopathy:  Cervical: No cervical adenopathy.     Right cervical: No superficial cervical adenopathy.    Left cervical: No superficial cervical adenopathy.  Neurological:     General: No focal deficit present.     Mental Status: She is alert and oriented to person, place, and time.  Psychiatric:        Mood and Affect: Mood normal.        Behavior: Behavior normal.        Thought Content: Thought content normal.         Judgment: Judgment normal.     UC Treatments / Results  Labs (all labs ordered are listed, but only abnormal results are displayed) Labs Reviewed - No data to display  EKG   Radiology No results found.  Procedures Procedures (including critical care time)  Medications Ordered in UC Medications - No data to display  Initial Impression / Assessment and Plan / UC Course  I have reviewed the triage vital signs and the nursing notes.  Pertinent labs & imaging results that were available during my care of the patient were reviewed by me and considered in my medical decision making (see chart for details).     This patient is a very pleasant 60 y.o. year old female presenting with viral syndrome and asthma exacerbation. Today this pt is afebrile nontachycardic nontachypneic, oxygenating well on room air, no wheezes rhonchi or rales. History asthma- currently fairly well controlled on albuterol prn. She declines refills on this today.  Declines covid, influenza testing given duration of symptoms. I am in agreement with this.  Centor score -1, rapid strep deferred, which I am in agreement with.   Low-dose prednisone sent as below. She has nasonex allergy but has taken prednisone multiple times without issue. Also sent viscous lidocaine. Continue albuterol inhaler.   Discussed option of breathing treatment which she declines, I am in agreement given mild symptoms.   ED return precautions discussed. Patient verbalizes understanding and agreement.   Level 4 for acute exacerbation of chronic condition and prescription drug management.  Final Clinical Impressions(s) / UC Diagnoses   Final diagnoses:  Moderate persistent asthma with acute exacerbation  Viral URI with cough     Discharge Instructions      -Prednisone, 2 pills taken at the same time for 5 days in a row.  Try taking this earlier in the day as it can give you energy. Avoid NSAIDs like ibuprofen and alleve while  taking this medication as they can increase your risk of stomach upset and even GI bleeding when in combination with a steroid. You can continue tylenol (acetaminophen) up to 1000mg  3x daily. -For sore throat, use lidocaine mouthwash up to every 4 hours. Make sure not to eat for at least 1 hour after using this, as your mouth will be very numb and you could bite yourself. -Albuterol inhaler as needed for cough, wheezing, shortness of breath, 1 to 2 puffs every 4-6 hours as needed. Can increase to 6 puffs to imitate breathing treatment. -Continue albuterol, singulair -With a virus, you're typically contagious for 5-7 days, or as long as you're having fevers. Follow-up if symptoms worsen/persist, like shortness of breath, new chest pain, new dizziness.     ED Prescriptions     Medication Sig Dispense Auth. Provider   predniSONE (DELTASONE) 20 MG tablet Take 2 tablets (40 mg total) by mouth daily for 5 days. Take with breakfast or lunch. Avoid NSAIDs (ibuprofen, etc) while taking this medication. 10 tablet  Hazel Sams, PA-C   lidocaine (XYLOCAINE) 2 % solution Use as directed 15 mLs in the mouth or throat as needed for mouth pain. 100 mL Hazel Sams, PA-C      PDMP not reviewed this encounter.   Hazel Sams, PA-C 06/24/21 1000

## 2021-06-24 NOTE — Discharge Instructions (Addendum)
-  Prednisone, 2 pills taken at the same time for 5 days in a row.  Try taking this earlier in the day as it can give you energy. Avoid NSAIDs like ibuprofen and alleve while taking this medication as they can increase your risk of stomach upset and even GI bleeding when in combination with a steroid. You can continue tylenol (acetaminophen) up to 1000mg  3x daily. -For sore throat, use lidocaine mouthwash up to every 4 hours. Make sure not to eat for at least 1 hour after using this, as your mouth will be very numb and you could bite yourself. -Albuterol inhaler as needed for cough, wheezing, shortness of breath, 1 to 2 puffs every 4-6 hours as needed. Can increase to 6 puffs to imitate breathing treatment. -Continue albuterol, singulair -With a virus, you're typically contagious for 5-7 days, or as long as you're having fevers. Follow-up if symptoms worsen/persist, like shortness of breath, new chest pain, new dizziness.

## 2021-06-24 NOTE — ED Triage Notes (Signed)
Pt c/o cough, sore throat, headache, and chills for a few days. Been taking mucous relief medication.

## 2021-06-27 ENCOUNTER — Other Ambulatory Visit: Payer: Self-pay | Admitting: Internal Medicine

## 2021-06-27 DIAGNOSIS — Z1231 Encounter for screening mammogram for malignant neoplasm of breast: Secondary | ICD-10-CM

## 2021-07-02 ENCOUNTER — Ambulatory Visit (INDEPENDENT_AMBULATORY_CARE_PROVIDER_SITE_OTHER): Payer: 59 | Admitting: Obstetrics and Gynecology

## 2021-07-02 ENCOUNTER — Encounter: Payer: Self-pay | Admitting: Obstetrics and Gynecology

## 2021-07-02 ENCOUNTER — Other Ambulatory Visit: Payer: Self-pay

## 2021-07-02 VITALS — BP 130/82 | HR 87 | Ht 63.5 in | Wt 192.0 lb

## 2021-07-02 DIAGNOSIS — Z01419 Encounter for gynecological examination (general) (routine) without abnormal findings: Secondary | ICD-10-CM

## 2021-07-02 NOTE — Patient Instructions (Signed)

## 2021-07-05 ENCOUNTER — Other Ambulatory Visit: Payer: Self-pay | Admitting: Allergy & Immunology

## 2021-07-24 ENCOUNTER — Other Ambulatory Visit: Payer: Self-pay

## 2021-07-24 ENCOUNTER — Telehealth: Payer: Self-pay | Admitting: Pulmonary Disease

## 2021-07-24 ENCOUNTER — Encounter: Payer: Self-pay | Admitting: Pulmonary Disease

## 2021-07-24 ENCOUNTER — Ambulatory Visit (INDEPENDENT_AMBULATORY_CARE_PROVIDER_SITE_OTHER): Payer: 59 | Admitting: Pulmonary Disease

## 2021-07-24 DIAGNOSIS — J4530 Mild persistent asthma, uncomplicated: Secondary | ICD-10-CM | POA: Diagnosis not present

## 2021-07-24 MED ORDER — BUDESONIDE-FORMOTEROL FUMARATE 160-4.5 MCG/ACT IN AERO: 2.0000 | INHALATION_SPRAY | Freq: Two times a day (BID) | RESPIRATORY_TRACT | 12 refills | Status: DC

## 2021-07-24 NOTE — Patient Instructions (Addendum)
°  Asthma may be worse due to weight gain, GERD or sleep apnea  X Rx for symbicort 160  2 puffs twice daily - rinse mouth after use  Use albuterol only as needed 1-2 puffs every 4h  X schedule home sleep test  take prilosec daily x 4 weeks

## 2021-07-24 NOTE — Assessment & Plan Note (Addendum)
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

## 2021-07-24 NOTE — Progress Notes (Signed)
Subjective:    Patient ID: Victoria [name], female    DOB: 1961/02/06, 61 y.o.   MRN: [id number]  HPI  61 year old never smoker presents to establish care for asthma.  She reports asthma since childhood, now for more than 40 years.  Review of serial spirometry reports show chronic airway obstruction.  She has last seen Dr. Annamaria Boots in 2017 and since then has been seeing the allergist Dr. Ernst Bowler. Has been has noted to be allergic, original skin testing by Dr. Velora Heckler, allergy shots or Xolair trial was not helpful  Last visit was allergist was 08/2020, reviewed consultation, noted to be controlled mild intermittent and maintained on albuterol and Singulair. She reports increased shortness of breath and wheezing since December for no obvious reasons.  She had 1 exposure to bleach but denies any URI symptoms.  She has been using her albuterol more frequently.  She reports a 20 pound weight gain over the last 1 year.  She denies nocturnal symptoms but does report intermittent heartburn for which she has been using Prilosec OTC.  She denies postnasal drip and has intermittent rhinitis symptoms for which she uses OTC Flonase   Triggers: Season changes -cold weather , smoke, pets, virus infections, house dust, strong odors. Environment: House, no pets, smokers or mold/water issues. 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  Epworth sleepiness score is 7. Bedtime is around 11 PM, sleep latency can be 30 minutes, she sleeps on her left side with 2 pillows with a humidifier in the room, reports 1-2 nocturnal awakenings with nocturia and is out of bed at 6 AM feeling tired with dryness of mouth and occasional headaches  Significant tests/ events reviewed  AEC 01/2021 100  Positive allergy testing in 2008 for common environmental allergens.  Seasonal and perennial allergic rhinitis (grasses,  weeds, trees, molds, dust mite, cat, and cockroach  Spirometry 08/2020-ratio 57, FEV1 1.21/56%, FVC 2.14/78%  08/2019-ratio 64, FEV1 1.10/53%, FVC 66%  2020-ratio 62, FEV1 1.11/50%, FVC 1.79/64%  2019-ratio 65, FEV1 59%, FVC 73%   spirometry 07/27/2009  FEV1 1.34/53%, FVC 2.12/68.8%, FEV1/FEC 0.63. 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   Asthma    Fibroids, subserous    GERD (gastroesophageal reflux disease)    Headache(784.0)    tension- not an issue   Hypertension    IGT (impaired glucose tolerance) 11/04/2019   Torn meniscus 04/2020   right   Urticaria        Past Surgical History:  Procedure Laterality Date   BALLOON DILATION N/A 11/19/2015   Procedure: BALLOON DILATION;  Surgeon: Garlan Fair, MD;  Location: [facility];  Service: Endoscopy;  Laterality: N/A;   BUNIONECTOMY Bilateral    COLONOSCOPY WITH PROPOFOL N/A 10/05/2012   Procedure: COLONOSCOPY WITH PROPOFOL;  Surgeon: Garlan Fair, MD;  Location: [facility];  Service: Endoscopy;  Laterality: N/A;   ESOPHAGOGASTRODUODENOSCOPY (EGD) WITH PROPOFOL N/A 11/19/2015   Procedure: ESOPHAGOGASTRODUODENOSCOPY (EGD) WITH PROPOFOL;  Surgeon: Garlan Fair, MD;  Location: [facility];  Service: Endoscopy;  Laterality: N/A;   TUBAL LIGATION  27 years ago    Allergies  Allergen Reactions   Shellfish Allergy Shortness Of Breath and Itching   Sulfonamide Derivatives Itching, Swelling and  Other (See Comments)    Throat swells   Nasonex [Mometasone Furoate] Other (See Comments)    Nose bleeds    Social History   Socioeconomic History   Marital status: Married    Spouse name: Not on file   Number of children: 2   Years of education: Not on file   Highest education level: Not on file  Occupational History   Occupation: Biochemist, clinical,    Employer:  [employer]  Tobacco Use   Smoking status: Never   Smokeless tobacco: Never  Vaping Use   Vaping Use: Never used  Substance and Sexual Activity   Alcohol use: No   Drug use: No   Sexual activity: Yes    Partners: Male    Birth control/protection: Surgical, Post-menopausal    Comment: Tubal Ligation  Other Topics Concern   Not on file  Social History Narrative   Work or School: Economist - full time, like job      Home Situation: lives with husband      Spiritual Beliefs: Christian - baptist      Lifestyle: no regular exercise, diet "bad"   Social Determinants of Radio broadcast assistant Strain: Not on file  Food Insecurity: Not on file  Transportation Needs: Not on file  Physical Activity: Not on file  Stress: Not on file  Social Connections: Not on file  Intimate Partner Violence: Not on file      Family History  Problem Relation Age of Onset   Emphysema Father    Lung cancer Father    High Cholesterol Mother    Osteoporosis Mother    Hypertension Mother    Migraines Daughter    Hypertension Son        exercise   Asthma Son    Hypertension Sister    COPD Other    Breast cancer Neg Hx    Allergic rhinitis Neg Hx    Angioedema Neg Hx    Atopy Neg Hx    Eczema Neg Hx    Immunodeficiency Neg Hx    Urticaria Neg Hx       Review of Systems   Constitutional: negative for anorexia, fevers and sweats  Eyes: negative for irritation, redness and visual disturbance  Ears, nose, mouth, throat, and face: negative for earaches, epistaxis, nasal congestion and sore throat  Respiratory: negative for cough, sputum and wheezing  Cardiovascular: negative for chest pain,  lower extremity edema, orthopnea, palpitations and syncope  Gastrointestinal: negative for abdominal pain, constipation, diarrhea, melena, nausea and vomiting  Genitourinary:negative for dysuria, frequency and hematuria  Hematologic/lymphatic: negative for bleeding, easy bruising  and lymphadenopathy  Musculoskeletal:negative for arthralgias, muscle weakness and stiff joints  Neurological: negative for coordination problems, gait problems, headaches and weakness  Endocrine: negative for diabetic symptoms including polydipsia, polyuria and weight loss     Objective:   Physical Exam  Gen. Pleasant, obese, in no distress, normal affect ENT - no pallor,icterus, no post nasal drip, class 2 airway Neck: No JVD, no thyromegaly, no carotid bruits Lungs: no use of accessory muscles, no dullness to percussion, decreased without rales or rhonchi  Cardiovascular: Rhythm regular, heart sounds  normal, no murmurs or gallops, no peripheral edema Abdomen: soft and non-tender, no hepatosplenomegaly, BS normal. Musculoskeletal: No deformities, no cyanosis or clubbing Neuro:  alert, non focal, no tremors       Assessment & Plan:

## 2021-07-25 NOTE — Telephone Encounter (Signed)
Attempted to call pt but unable to reach. Left message for her to return call. 

## 2021-07-26 NOTE — Telephone Encounter (Signed)
ATC patient to talk about inhaler, per DPR left detailed message letting her know that she could possibly do patient assistance paperwork to see if she qualifies or for her to call her insurance company to see which ones are on her formulary. Advised her that most of the inhalers we send in may have the copay of $75 so it may benefit her to check with her insurance. Advised her to call back on Monday after 8 am to let us know what they say or if she is interested in patient assistance.

## 2021-07-26 NOTE — Telephone Encounter (Signed)
Patient is returning phone call. Patient phone number is [phone].

## 2021-07-30 ENCOUNTER — Telehealth: Payer: Self-pay | Admitting: Pulmonary Disease

## 2021-07-31 NOTE — Telephone Encounter (Signed)
Called pt and there was no answer-LMTCB °

## 2021-08-06 ENCOUNTER — Other Ambulatory Visit: Payer: Self-pay

## 2021-08-06 ENCOUNTER — Ambulatory Visit (INDEPENDENT_AMBULATORY_CARE_PROVIDER_SITE_OTHER): Payer: 59 | Admitting: Primary Care

## 2021-08-06 ENCOUNTER — Encounter: Payer: Self-pay | Admitting: Primary Care

## 2021-08-06 ENCOUNTER — Ambulatory Visit (INDEPENDENT_AMBULATORY_CARE_PROVIDER_SITE_OTHER): Payer: 59

## 2021-08-06 VITALS — BP 136/92 | HR 83 | Temp 98.1°F | Ht 64.0 in | Wt 196.0 lb

## 2021-08-06 DIAGNOSIS — J4531 Mild persistent asthma with (acute) exacerbation: Secondary | ICD-10-CM | POA: Diagnosis not present

## 2021-08-06 DIAGNOSIS — J3089 Other allergic rhinitis: Secondary | ICD-10-CM

## 2021-08-06 DIAGNOSIS — J302 Other seasonal allergic rhinitis: Secondary | ICD-10-CM | POA: Diagnosis not present

## 2021-08-06 MED ORDER — PREDNISONE 10 MG PO TABS: ORAL_TABLET | ORAL | 0 refills | Status: DC

## 2021-08-06 MED ORDER — FLUTICASONE-SALMETEROL 250-50 MCG/ACT IN AEPB: 1.0000 | INHALATION_SPRAY | Freq: Two times a day (BID) | RESPIRATORY_TRACT | 3 refills | Status: DC

## 2021-08-06 NOTE — Assessment & Plan Note (Signed)
-   Advised she resume regular use Zyrtec 10mg  daily and Flonase nasal spray daily

## 2021-08-06 NOTE — Assessment & Plan Note (Addendum)
-   Asthma symptoms have been worse recently. Dr. Elsworth Soho felt this may be d/t weight gain, GERD or sleep apnea. She is not currently on maintenance ICS/LABA inhaler; Symbicort 132mcg was too expensive. We will send in RX generic Advair 250-61mcg one puff twice daily. Advised she consistently take Prilosec once daily. Sleep study pending scheduling. Sending in RX prednisone taper for acute symptoms. Checking routine CXR. If no better advised she call our office, may need additional work up for dyspnea symptoms.

## 2021-08-06 NOTE — Patient Instructions (Signed)
Recommendations: Start taking Prilosec consistently every day Start [place] one puff morning and evening (this is generic advair) Start taking zyrtec and Flonase regularly Take prednisone taper as directed  Orders: CXR re: sob   Follow-up: Call if cough is not some better in 1 week

## 2021-08-06 NOTE — Progress Notes (Signed)
@Patient  ID: Victoria [name], female    DOB: Jul 19, 1960, 61 y.o.   MRN: [id number]  Chief Complaint  Patient presents with   Follow-up    Referring provider: Isaac Bliss, [place]*  HPI: 61 year old female, never smoked.  Past medical history significant for mild persistent asthma.  Patient of Dr. Elsworth Soho, last seen in office on 07/24/2021.  08/06/2021- interim hx  Patient presents today for acute overview due to wheezing/shortness of breath.  Patient was seen 11 days ago by Dr. Elsworth Soho, patient felt to have chronic obstructive asthma likely with airway remodeling due to obstruction not being reversible on past spirometry.  Asthma felt to be worse due to weight gain, GERD or sleep apnea.  Patient was sent in Rx for Symbicort 160 two puffs twice daily.  She was ordered for home sleep study to rule out sleep apnea.  She was also advised to take Prilosec daily x4 weeks.  She reports getting winded with light activity along with dry cough, PND and chest tightenss. She was unable to get symbciort 143mcg d/t medication cost, insurance prefers generic Advair. She is taking Prilosec but not consistently every day. She is not currently taking zyrtec and Flonase, uses as needed only.    Allergies  Allergen Reactions   Shellfish Allergy Shortness Of Breath and Itching   Sulfonamide Derivatives Itching, Swelling and Other (See Comments)    Throat swells   Nasonex [Mometasone Furoate] Other (See Comments)    Nose bleeds    Immunization History  Administered Date(s) Administered   Influenza Split 03/30/2013, 03/30/2014, 05/16/2015   Influenza Whole 03/30/2009   Influenza,inj,Quad PF,6+ Mos 03/09/2019   Influenza-Unspecified 02/28/2018, 05/19/2020, 04/23/2021   Moderna Sars-Covid-2 Vaccination 09/14/2019, 10/12/2019, 05/27/2020, 01/06/2021   Td 10/21/2016   Tdap 06/30/2009   Zoster Recombinat (Shingrix) 02/07/2020, 08/09/2020    Past Medical History:  Diagnosis Date   Abnormal Pap smear  of cervix    Allergic rhinitis    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   Asthma    Fibroids, subserous    GERD (gastroesophageal reflux disease)    Headache(784.0)    tension- not an issue   Hypertension    IGT (impaired glucose tolerance) 11/04/2019   Torn meniscus 04/2020   right   Urticaria     Tobacco History: Social History   Tobacco Use  Smoking Status Never  Smokeless Tobacco Never   Counseling given: Not Answered   Outpatient Medications Prior to Visit  Medication Sig Dispense Refill   albuterol (PROVENTIL HFA) 108 (90 Base) MCG/ACT inhaler Inhale two puffs every 4-6 hours if needed for cough or wheeze 18 g 1   azelastine (ASTELIN) 0.1 % nasal spray Place 2 sprays into both nostrils 2 (two) times daily. Use in each nostril as directed 30 mL 5   calcium carbonate (OSCAL) 1500 (600 Ca) MG TABS tablet Take by mouth 2 (two) times daily with a meal.     cetirizine (ZYRTEC) 10 MG tablet Take 10 mg by mouth as needed for allergies.      cholecalciferol (VITAMIN D3) 25 MCG (1000 UNIT) tablet Take 1,000 Units by mouth daily.     diphenhydrAMINE (BENADRYL) 25 MG tablet Take 25 mg by mouth at bedtime as needed for allergies or sleep.      docusate sodium (COLACE) 100 MG capsule Take 100 mg by mouth daily.     EPINEPHrine (AUVI-Q) 0.3 mg/0.3 mL IJ SOAJ injection Use as directed for severe allergic reaction 2 each 2  fluticasone (CUTIVATE) 0.05 % cream fluticasone propionate 0.05 % topical cream  APPLY SMALL AMOUNT TOPICALLY TO THE AFFECTED AREA TWICE DAILY AS NEEDED     fluticasone (FLONASE) 50 MCG/ACT nasal spray Place 2 sprays into both nostrils daily as needed for allergies. 16 g 3   hydrochlorothiazide (HYDRODIURIL) 25 MG tablet TAKE 1 TABLET DAILY (LAST REFILL, NEED APPOINTMENT) 90 tablet 3   lidocaine (XYLOCAINE) 2 % solution Use as directed 15 mLs in the mouth or throat as needed for mouth pain. 100 mL 0   Magnesium 200 MG TABS Take by mouth.     montelukast (SINGULAIR) 10 MG  tablet TAKE 1 TABLET AT BEDTIME 90 tablet 0   Multiple Vitamin (MULTIVITAMIN) tablet Take 1 tablet by mouth daily.     polyethylene glycol (MIRALAX / GLYCOLAX) packet Take 17 g by mouth daily.     triamcinolone (KENALOG) 0.1 % triamcinolone acetonide 0.1 % topical cream  APPLY SMALL AMOUNT TOPICALLY TO THE AFFECTED AREA TWICE DAILY AS NEEDED     Turmeric 500 MG CAPS Take by mouth.     budesonide-formoterol (SYMBICORT) 160-4.5 MCG/ACT inhaler Inhale 2 puffs into the lungs 2 (two) times daily. 1 each 12   No facility-administered medications prior to visit.    Review of Systems  Review of Systems  Constitutional: Negative.   HENT:  Positive for postnasal drip.   Respiratory:  Positive for cough, chest tightness and shortness of breath.     Physical Exam  BP (!) 136/92 (BP Location: Right Arm, Patient Position: Sitting, Cuff Size: Normal)    Pulse 83    Temp 98.1 F (36.7 C) (Oral)    Ht 5\' 4"  (1.626 m)    Wt 196 lb (88.9 kg)    LMP 03/30/2012 Comment: Dr Cathie Olden - GYN   SpO2 96%    BMI 33.64 kg/m  Physical Exam Constitutional:      Appearance: Normal appearance.  HENT:     Head: Normocephalic and atraumatic.     Nose: Rhinorrhea present.     Mouth/Throat:     Mouth: Mucous membranes are moist.     Pharynx: Oropharynx is clear.  Cardiovascular:     Rate and Rhythm: Normal rate and regular rhythm.  Pulmonary:     Effort: Pulmonary effort is normal.     Breath sounds: Normal breath sounds.  Musculoskeletal:        General: Normal range of motion.     Cervical back: Normal range of motion and neck supple.  Skin:    General: Skin is warm and dry.  Neurological:     General: No focal deficit present.     Mental Status: She is alert and oriented to person, place, and time. Mental status is at baseline.  Psychiatric:        Mood and Affect: Mood normal.        Behavior: Behavior normal.        Thought Content: Thought content normal.        Judgment: Judgment normal.     Lab  Results:  CBC    Component Value Date/Time   WBC 4.0 02/06/2021 0754   RBC 4.41 02/06/2021 0754   HGB 12.5 02/06/2021 0754   HGB 12.8 04/26/2018 1527   HCT 37.9 02/06/2021 0754   HCT 39.0 04/26/2018 1527   PLT 204.0 02/06/2021 0754   PLT 271 04/26/2018 1527   MCV 86.1 02/06/2021 0754   MCV 85 04/26/2018 1527   MCH 27.8 04/26/2018 1527  MCH 28.3 09/20/2015 2045   MCHC 33.1 02/06/2021 0754   RDW 15.1 02/06/2021 0754   RDW 14.4 04/26/2018 1527   LYMPHSABS 1.4 02/06/2021 0754   MONOABS 0.2 02/06/2021 0754   EOSABS 0.1 02/06/2021 0754   BASOSABS 0.0 02/06/2021 0754    BMET    Component Value Date/Time   NA 143 02/06/2021 0754   NA 142 04/26/2018 1527   K 3.6 02/06/2021 0754   CL 102 02/06/2021 0754   CO2 31 02/06/2021 0754   GLUCOSE 105 (H) 02/06/2021 0754   BUN 11 02/06/2021 0754   BUN 8 04/26/2018 1527   CREATININE 0.85 02/06/2021 0754   CALCIUM 9.8 02/06/2021 0754   GFRNONAA 100 04/26/2018 1527   GFRAA 115 04/26/2018 1527    BNP No results found for: BNP  ProBNP No results found for: PROBNP  Imaging: No results found.   Assessment & Plan:   Mild persistent asthma with acute exacerbation - Asthma symptoms have been worse recently. Dr. Elsworth Soho felt this may be d/t weight gain, GERD or sleep apnea. She is not currently on maintenance ICS/LABA inhaler; Symbicort 174mcg was too expensive. We will send in RX generic Advair 250-60mcg one puff twice daily. Advised she consistently take Prilosec once daily. Sleep study pending scheduling. Sending in RX prednisone taper for acute symptoms. Checking routine CXR. If no better advised she call our office, may need additional work up for dyspnea symptoms.  Seasonal and perennial allergic rhinitis - Advised she resume regular use Zyrtec 10mg  daily and Flonase nasal spray daily    Martyn Ehrich, NP 08/06/2021

## 2021-08-08 ENCOUNTER — Ambulatory Visit (INDEPENDENT_AMBULATORY_CARE_PROVIDER_SITE_OTHER): Payer: 59 | Admitting: Internal Medicine

## 2021-08-08 ENCOUNTER — Encounter: Payer: Self-pay | Admitting: Internal Medicine

## 2021-08-08 VITALS — BP 124/84 | HR 85 | Temp 98.5°F | Wt 193.4 lb

## 2021-08-08 DIAGNOSIS — R739 Hyperglycemia, unspecified: Secondary | ICD-10-CM | POA: Diagnosis not present

## 2021-08-08 DIAGNOSIS — J4520 Mild intermittent asthma, uncomplicated: Secondary | ICD-10-CM | POA: Diagnosis not present

## 2021-08-08 DIAGNOSIS — E785 Hyperlipidemia, unspecified: Secondary | ICD-10-CM | POA: Diagnosis not present

## 2021-08-08 DIAGNOSIS — I10 Essential (primary) hypertension: Secondary | ICD-10-CM

## 2021-08-08 DIAGNOSIS — E1169 Type 2 diabetes mellitus with other specified complication: Secondary | ICD-10-CM

## 2021-08-08 LAB — MICROALBUMIN / CREATININE URINE RATIO
Creatinine,U: 182.5 mg/dL
Microalb Creat Ratio: 2.1 mg/g (ref 0.0–30.0)
Microalb, Ur: 3.8 mg/dL — ABNORMAL HIGH (ref 0.0–1.9)

## 2021-08-08 LAB — POCT GLYCOSYLATED HEMOGLOBIN (HGB A1C): Hemoglobin A1C: 6.5 % — AB (ref 4.0–5.6)

## 2021-08-08 MED ORDER — ATORVASTATIN CALCIUM 20 MG PO TABS: 20.0000 mg | ORAL_TABLET | Freq: Every day | ORAL | 1 refills | Status: DC

## 2021-08-08 MED ORDER — METFORMIN HCL 500 MG PO TABS: 500.0000 mg | ORAL_TABLET | Freq: Every day | ORAL | 1 refills | Status: DC

## 2021-08-08 NOTE — Progress Notes (Signed)
CXR showed clear lungs, no acute cardiopulmonary disease. She does have aortic atherosclerosis, advised she follow-up with PCP for management

## 2021-08-08 NOTE — Progress Notes (Signed)
Established Patient Office Visit     This visit occurred during the SARS-CoV-2 public health emergency.  Safety protocols were in place, including screening questions prior to the visit, additional usage of staff PPE, and extensive cleaning of exam room while observing appropriate contact time as indicated for disinfecting solutions.    CC/Reason for Visit: 19-month follow-up chronic medical conditions  HPI: Victoria [name] is a 61 y.o. female who is coming in today for the above mentioned reasons. Past Medical History is significant for: Obesity, impaired glucose tolerance, hypertension and mild intermittent asthma.  She saw pulmonary 2 days ago due to an asthma flareup.  She is currently on prednisone and Wixela.  Chest x-ray was normal.  Blood pressure is well controlled today at 124/84, unfortunately her A1c has increased to 6.5 which now makes her a diabetic.  Do not believe this is prednisone related as she has just taken the first dose of prednisone.   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   Asthma    Fibroids, subserous    GERD (gastroesophageal reflux disease)    Headache(784.0)    tension- not an issue   Hypertension    IGT (impaired glucose tolerance) 11/04/2019   Torn meniscus 04/2020   right   Urticaria     Past Surgical History:  Procedure Laterality Date   BALLOON DILATION N/A 11/19/2015   Procedure: BALLOON DILATION;  Surgeon: Garlan Fair, MD;  Location: [facility];  Service: Endoscopy;  Laterality: N/A;   BUNIONECTOMY Bilateral    COLONOSCOPY WITH PROPOFOL N/A 10/05/2012   Procedure: COLONOSCOPY WITH PROPOFOL;  Surgeon: Garlan Fair, MD;  Location: [facility];  Service: Endoscopy;  Laterality: N/A;   ESOPHAGOGASTRODUODENOSCOPY (EGD) WITH PROPOFOL N/A 11/19/2015   Procedure: ESOPHAGOGASTRODUODENOSCOPY (EGD) WITH PROPOFOL;   Surgeon: Garlan Fair, MD;  Location: [facility];  Service: Endoscopy;  Laterality: N/A;   TUBAL LIGATION  27 years ago    Social History:  reports that she has never smoked. She has never used smokeless tobacco. She reports that she does not drink alcohol and does not use drugs.  Allergies: Allergies  Allergen Reactions   Shellfish Allergy Shortness Of Breath and Itching   Sulfonamide Derivatives Itching, Swelling and Other (See Comments)    Throat swells   Nasonex [Mometasone Furoate] Other (See Comments)    Nose bleeds    Family History:  Family History  Problem Relation Age of Onset   Emphysema Father    Lung cancer Father    High Cholesterol Mother    Osteoporosis Mother    Hypertension Mother    Migraines Daughter    Hypertension Son        exercise   Asthma Son    Hypertension Sister    COPD Other    Breast cancer Neg Hx    Allergic rhinitis Neg Hx    Angioedema Neg Hx    Atopy Neg Hx    Eczema Neg Hx    Immunodeficiency Neg Hx    Urticaria Neg Hx      Current Outpatient Medications:    albuterol (PROVENTIL HFA) 108 (90 Base) MCG/ACT inhaler, Inhale two puffs every 4-6 hours if needed for cough or wheeze, Disp: 18 g, Rfl: 1   atorvastatin (LIPITOR) 20 MG tablet, Take 1 tablet (20 mg total) by mouth daily., Disp:  90 tablet, Rfl: 1   azelastine (ASTELIN) 0.1 % nasal spray, Place 2 sprays into both nostrils 2 (two) times daily. Use in each nostril as directed, Disp: 30 mL, Rfl: 5   calcium carbonate (OSCAL) 1500 (600 Ca) MG TABS tablet, Take by mouth 2 (two) times daily with a meal., Disp: , Rfl:    cetirizine (ZYRTEC) 10 MG tablet, Take 10 mg by mouth as needed for allergies. , Disp: , Rfl:    cholecalciferol (VITAMIN D3) 25 MCG (1000 UNIT) tablet, Take 1,000 Units by mouth daily., Disp: , Rfl:    diphenhydrAMINE (BENADRYL) 25 MG tablet, Take 25 mg by mouth at bedtime as needed for allergies or sleep. , Disp: , Rfl:    docusate sodium (COLACE) 100 MG  capsule, Take 100 mg by mouth daily., Disp: , Rfl:    EPINEPHrine (AUVI-Q) 0.3 mg/0.3 mL IJ SOAJ injection, Use as directed for severe allergic reaction, Disp: 2 each, Rfl: 2   fluticasone (CUTIVATE) 0.05 % cream, fluticasone propionate 0.05 % topical cream  APPLY SMALL AMOUNT TOPICALLY TO THE AFFECTED AREA TWICE DAILY AS NEEDED, Disp: , Rfl:    fluticasone (FLONASE) 50 MCG/ACT nasal spray, Place 2 sprays into both nostrils daily as needed for allergies., Disp: 16 g, Rfl: 3   fluticasone-salmeterol (WIXELA INHUB) 250-50 MCG/ACT AEPB, Inhale 1 puff into the lungs in the morning and at bedtime., Disp: 60 each, Rfl: 3   hydrochlorothiazide (HYDRODIURIL) 25 MG tablet, TAKE 1 TABLET DAILY (LAST REFILL, NEED APPOINTMENT), Disp: 90 tablet, Rfl: 3   lidocaine (XYLOCAINE) 2 % solution, Use as directed 15 mLs in the mouth or throat as needed for mouth pain., Disp: 100 mL, Rfl: 0   Magnesium 200 MG TABS, Take by mouth., Disp: , Rfl:    metFORMIN (GLUCOPHAGE) 500 MG tablet, Take 1 tablet (500 mg total) by mouth at bedtime., Disp: 90 tablet, Rfl: 1   montelukast (SINGULAIR) 10 MG tablet, TAKE 1 TABLET AT BEDTIME, Disp: 90 tablet, Rfl: 0   Multiple Vitamin (MULTIVITAMIN) tablet, Take 1 tablet by mouth daily., Disp: , Rfl:    polyethylene glycol (MIRALAX / GLYCOLAX) packet, Take 17 g by mouth daily., Disp: , Rfl:    predniSONE (DELTASONE) 10 MG tablet, 4 tabs for 2 days, then 3 tabs for 2 days, 2 tabs for 2 days, then 1 tab for 2 days, then stop, Disp: 20 tablet, Rfl: 0   triamcinolone (KENALOG) 0.1 %, triamcinolone acetonide 0.1 % topical cream  APPLY SMALL AMOUNT TOPICALLY TO THE AFFECTED AREA TWICE DAILY AS NEEDED, Disp: , Rfl:    Turmeric 500 MG CAPS, Take by mouth., Disp: , Rfl:   Review of Systems:  Constitutional: Denies fever, chills, diaphoresis, appetite change and fatigue.  HEENT: Denies photophobia, eye pain, redness, hearing loss, ear pain, congestion, sore throat, rhinorrhea, sneezing, mouth sores,  trouble swallowing, neck pain, neck stiffness and tinnitus.   Respiratory: Denies , chest tightness,  and wheezing.   Cardiovascular: Denies chest pain, palpitations and leg swelling.  Gastrointestinal: Denies nausea, vomiting, abdominal pain, diarrhea, constipation, blood in stool and abdominal distention.  Genitourinary: Denies dysuria, urgency, frequency, hematuria, flank pain and difficulty urinating.  Endocrine: Denies: hot or cold intolerance, sweats, changes in hair or nails, polyuria, polydipsia. Musculoskeletal: Denies myalgias, back pain, joint swelling, arthralgias and gait problem.  Skin: Denies pallor, rash and wound.  Neurological: Denies dizziness, seizures, syncope, weakness, light-headedness, numbness and headaches.  Hematological: Denies adenopathy. Easy bruising, personal or family bleeding history  Psychiatric/Behavioral:  Denies suicidal ideation, mood changes, confusion, nervousness, sleep disturbance and agitation    Physical Exam: Vitals:   08/08/21 0705  BP: 124/84  Pulse: 85  Temp: 98.5 F (36.9 C)  TempSrc: Oral  SpO2: 98%  Weight: 193 lb 6.4 oz (87.7 kg)    Body mass index is 33.2 kg/m.   Constitutional: NAD, calm, comfortable Eyes: PERRL, lids and conjunctivae normal ENMT: Mucous membranes are moist.  Respiratory: clear to auscultation bilaterally, no wheezing, no crackles. Normal respiratory effort. No accessory muscle use.  Cardiovascular: Regular rate and rhythm, no murmurs / rubs / gallops. No extremity edema.  Neurologic: Grossly intact and nonfocal Psychiatric: Normal judgment and insight. Alert and oriented x 3. Normal mood.    Impression and Plan:  Type 2 diabetes mellitus with other specified complication, without long-term current use of insulin ([place])  - Plan: metFORMIN (GLUCOPHAGE) 500 MG tablet, Microalbumin/Creatinine Ratio, Urine -This is a new diagnosis for her. -We have discussed importance of lifestyle changes. -Will go ahead  and start metformin 500 mg at bedtime, screen for microalbuminuria, have recommended an annual eye exam. -She will follow-up in 3 months.  Asthma, mild intermittent, well-controlled -Currently with a flareup under treatment by pulmonary.  Primary hypertension -Well-controlled.  Hyperlipidemia associated with type 2 diabetes mellitus ([place])  - Plan: atorvastatin (LIPITOR) 20 MG tablet -Lipid panel in August 2022 with a total cholesterol of 273, triglycerides 131 and LDL 107. -As she is now a diabetic we will go ahead and start atorvastatin 20 mg daily, recheck lipids in 3 months.  Time spent: 33 minutes reviewing chart, interviewing and examining patient and formulating plan of care.   Patient Instructions  -Nice seeing you today!!  -Lab work today; will notify you once results are available.  -Start metformin 500 mg at bedtime.  -Start atorvastatin 20 mg at bedtime.  -Schedule follow up in 3 months.    Lelon Frohlich, MD [facility]

## 2021-08-08 NOTE — Patient Instructions (Addendum)
-  Nice seeing you today!!  -Lab work today; will notify you once results are available.  -Start metformin 500 mg at bedtime.  -Start atorvastatin 20 mg at bedtime.  -CXR from yesterday is normal.  -Schedule follow up in 3 months.

## 2021-08-12 ENCOUNTER — Other Ambulatory Visit: Payer: Self-pay | Admitting: Internal Medicine

## 2021-08-12 ENCOUNTER — Encounter: Payer: Self-pay | Admitting: Internal Medicine

## 2021-08-12 DIAGNOSIS — R809 Proteinuria, unspecified: Secondary | ICD-10-CM | POA: Insufficient documentation

## 2021-08-12 DIAGNOSIS — E1129 Type 2 diabetes mellitus with other diabetic kidney complication: Secondary | ICD-10-CM | POA: Insufficient documentation

## 2021-08-12 MED ORDER — LOSARTAN POTASSIUM 25 MG PO TABS: 25.0000 mg | ORAL_TABLET | Freq: Every day | ORAL | 1 refills | Status: DC

## 2021-08-12 NOTE — Progress Notes (Signed)
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  Start losartan 25 mg daily.  Prescription sent.

## 2021-08-13 ENCOUNTER — Institutional Professional Consult (permissible substitution): Payer: 59 | Admitting: Internal Medicine

## 2021-08-16 NOTE — Telephone Encounter (Signed)
Pt has had a recent OV since 1/31 and inhalers were addressed at that visit.

## 2021-08-19 ENCOUNTER — Telehealth: Payer: Self-pay | Admitting: Internal Medicine

## 2021-08-19 ENCOUNTER — Ambulatory Visit
Admission: RE | Admit: 2021-08-19 | Discharge: 2021-08-19 | Disposition: A | Discharge status: Home / Self Care | Payer: 59 | Source: Ambulatory Visit | Attending: Internal Medicine | Admitting: Internal Medicine

## 2021-08-19 DIAGNOSIS — Z1231 Encounter for screening mammogram for malignant neoplasm of breast: Secondary | ICD-10-CM

## 2021-08-19 NOTE — Telephone Encounter (Signed)
Pt call and stated she is retuning your call and would like a call back.

## 2021-08-19 NOTE — Telephone Encounter (Signed)
Left message on machine for patient to return our call 

## 2021-08-19 NOTE — Telephone Encounter (Signed)
Pt is calling and was taking prednisone when she had her blood sugar check and pt is no longer on prednisone and she is taking metformin. Pt is wondering could her BS was elevated due to taking prednisone and also does she need to have her BS recheck. Pt took 8 prednisone instead of 4 prednisone the day before labs was drawn

## 2021-08-20 ENCOUNTER — Other Ambulatory Visit: Payer: Self-pay | Admitting: Internal Medicine

## 2021-08-20 ENCOUNTER — Encounter: Payer: Self-pay | Admitting: Internal Medicine

## 2021-08-20 DIAGNOSIS — R928 Other abnormal and inconclusive findings on diagnostic imaging of breast: Secondary | ICD-10-CM

## 2021-08-21 NOTE — Telephone Encounter (Signed)
Replied to patient via MyChart. 

## 2021-09-05 ENCOUNTER — Ambulatory Visit: Payer: 59 | Admitting: Allergy & Immunology

## 2021-09-13 ENCOUNTER — Other Ambulatory Visit: Payer: Self-pay | Admitting: Internal Medicine

## 2021-09-13 ENCOUNTER — Ambulatory Visit
Admission: RE | Admit: 2021-09-13 | Discharge: 2021-09-13 | Disposition: A | Discharge status: Home / Self Care | Payer: 59 | Source: Ambulatory Visit | Attending: Internal Medicine | Admitting: Internal Medicine

## 2021-09-13 DIAGNOSIS — R928 Other abnormal and inconclusive findings on diagnostic imaging of breast: Secondary | ICD-10-CM

## 2021-09-13 DIAGNOSIS — N6320 Unspecified lump in the left breast, unspecified quadrant: Secondary | ICD-10-CM

## 2021-09-16 ENCOUNTER — Telehealth: Payer: Self-pay | Admitting: Internal Medicine

## 2021-09-16 NOTE — Telephone Encounter (Signed)
Patient calling in with respiratory symptoms: ?Shortness of breath, chest pain, palpitations or other red words send to Triage ? ?Does the patient have a fever over 100, cough, congestion, sore throat, runny nose, lost of taste/smell (please list symptoms that patient has)?headache,cough ? ?What date did symptoms start?09-15-2021 ?(If over 5 days ago, pt may be scheduled for in person visit) ? ?Have you tested for Covid in the last 5 days? Yes  ? ?If yes, was it positive '[x]'$  OR negative '[]'$ ? If positive in the last 5 days, please schedule virtual visit now. If negative, schedule for an in person OV with the next available provider if PCP has no openings. Please also let patient know they will be tested again (follow the script below) ? ?"you will have to arrive 26mns prior to your appt time to be Covid tested. Please park in back of office at the [facility] & call [phone] let the staff know you have arrived. A staff member will meet you at your car to do a rapid covid test. Once the test has resulted you will be notified by phone of your results to determine if appt will remain an in person visit or be converted to a virtual/phone visit. If you arrive less than 360ms before your appt time, your visit will be automatically converted to virtual & any recommended testing will happen AFTER the visit." ?Pt has a virtual with dr kiMaudie Mercuryomorrow 09-17-2021 ? ?THINGS TO REMEMBER ? ?If no availability for virtual visit in office,  please schedule another [place] office ? ?If no [place] another [place], please instruct patient that they can schedule an evisit or virtual visit through their mychart account. Visits up to 8pm ? ?patients can be seen in office 5 days after positive COVID test ? ?  ?

## 2021-09-16 NOTE — Telephone Encounter (Signed)
error 

## 2021-09-17 ENCOUNTER — Encounter: Payer: Self-pay | Admitting: Family Medicine

## 2021-09-17 ENCOUNTER — Telehealth (INDEPENDENT_AMBULATORY_CARE_PROVIDER_SITE_OTHER): Payer: 59 | Admitting: Family Medicine

## 2021-09-17 ENCOUNTER — Other Ambulatory Visit: Payer: Self-pay

## 2021-09-17 DIAGNOSIS — U071 COVID-19: Secondary | ICD-10-CM

## 2021-09-17 MED ORDER — NIRMATRELVIR/RITONAVIR (PAXLOVID)TABLET: 3.0000 | ORAL_TABLET | Freq: Two times a day (BID) | ORAL | 0 refills | Status: AC

## 2021-09-17 NOTE — Progress Notes (Signed)
Virtual Visit via Video Note ? ?I connected with Victoria [name] ? on 09/17/21 at 10:00 AM EDT by a video enabled telemedicine application and verified that I am speaking with the correct person using two identifiers. ? Location patient: [name] ?Location provider:work or home office ?Persons participating in the virtual visit: patient, provider ? ?I discussed the limitations and requested verbal permission for telemedicine visit. The patient expressed understanding and agreed to proceed. ? ? ?HPI: ? ?Acute telemedicine visit for Covid19: ?-Onset: about 2-3 days ago, tested positive yesterday ?-Symptoms include: sneezing, headache ,nasal congestion, mild cough, chills, subjective fever, headaches improved with tylenol ?-Denies:CP, SOB, NVD ?-reports drinking fluids and eating ok ?-Has tried:sudafed, tylenol ?-Pertinent past medical history: see below, had covid 2 years ago ?-GFR was 75 in august, denies any hx of liver or kidney disease ?-Pertinent medication allergies: ?Allergies  ?Allergen Reactions  ? Shellfish Allergy Shortness Of Breath and Itching  ? Sulfonamide Derivatives Itching, Swelling and Other (See Comments)  ?  Throat swells  ? Nasonex [Mometasone Furoate] Other (See Comments)  ?  Nose bleeds  ?-COVID-19 vaccine status: vaccinated x2 and had 2 boosters ?Immunization History  ?Administered Date(s) Administered  ? Influenza Split 03/30/2013, 03/30/2014, 05/16/2015  ? Influenza Whole 03/30/2009  ? Influenza,inj,Quad PF,6+ Mos 03/09/2019  ? Influenza-Unspecified 02/28/2018, 05/19/2020, 04/23/2021  ? Moderna Sars-Covid-2 Vaccination 09/14/2019, 10/12/2019, 05/27/2020, 01/06/2021  ? Td 10/21/2016  ? Tdap 06/30/2009  ? Zoster Recombinat (Shingrix) 02/07/2020, 08/09/2020  ? ? ? ?ROS: See pertinent positives and negatives per HPI. ? ?Past Medical History:  ?Diagnosis Date  ? Abnormal Pap smear of cervix   ? Allergic rhinitis   ? 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 Asthma   ? Fibroids, subserous   ? GERD (gastroesophageal reflux  disease)   ? Headache(784.0)   ? tension- not an issue  ? Hypertension   ? IGT (impaired glucose tolerance) 11/04/2019  ? Torn meniscus 04/2020  ? right  ? Urticaria   ? ? ?Past Surgical History:  ?Procedure Laterality Date  ? BALLOON DILATION N/A 11/19/2015  ? Procedure: BALLOON DILATION;  Surgeon: Garlan Fair, MD;  Location: [facility];  Service: Endoscopy;  Laterality: N/A;  ? BUNIONECTOMY Bilateral   ? COLONOSCOPY WITH PROPOFOL N/A 10/05/2012  ? Procedure: COLONOSCOPY WITH PROPOFOL;  Surgeon: Garlan Fair, MD;  Location: [facility];  Service: Endoscopy;  Laterality: N/A;  ? ESOPHAGOGASTRODUODENOSCOPY (EGD) WITH PROPOFOL N/A 11/19/2015  ? Procedure: ESOPHAGOGASTRODUODENOSCOPY (EGD) WITH PROPOFOL;  Surgeon: Garlan Fair, MD;  Location: [facility];  Service: Endoscopy;  Laterality: N/A;  ? TUBAL LIGATION  27 years ago  ? ? ? ?Current Outpatient Medications:  ?  albuterol (PROVENTIL HFA) 108 (90 Base) MCG/ACT inhaler, Inhale two puffs every 4-6 hours if needed for cough or wheeze, Disp: 18 g, Rfl: 1 ?  atorvastatin (LIPITOR) 20 MG tablet, Take 1 tablet (20 mg total) by mouth daily., Disp: 90 tablet, Rfl: 1 ?  azelastine (ASTELIN) 0.1 % nasal spray, Place 2 sprays into both nostrils 2 (two) times daily. Use in each nostril as directed, Disp: 30 mL, Rfl: 5 ?  cetirizine (ZYRTEC) 10 MG tablet, Take 10 mg by mouth as needed for allergies. , Disp: , Rfl:  ?  cholecalciferol (VITAMIN D3) 25 MCG (1000 UNIT) tablet, Take 1,000 Units by mouth daily., Disp: , Rfl:  ?  diphenhydrAMINE (BENADRYL) 25 MG tablet, Take 25 mg by mouth at bedtime as needed for allergies or sleep. , Disp: , Rfl:  ?  docusate sodium (COLACE) 100 MG capsule, Take 100 mg by mouth daily., Disp: , Rfl:  ?  EPINEPHrine (AUVI-Q) 0.3 mg/0.3 mL IJ SOAJ injection, Use as directed for severe allergic reaction, Disp: 2 each, Rfl: 2 ?  fluticasone (FLONASE) 50 MCG/ACT nasal spray, Place 2 sprays into both nostrils daily as needed for allergies., Disp:  16 g, Rfl: 3 ?  fluticasone-salmeterol (WIXELA INHUB) 250-50 MCG/ACT AEPB, Inhale 1 puff into the lungs in the morning and at bedtime., Disp: 60 each, Rfl: 3 ?  hydrochlorothiazide (HYDRODIURIL) 25 MG tablet, TAKE 1 TABLET DAILY (LAST REFILL, NEED APPOINTMENT), Disp: 90 tablet, Rfl: 3 ?  losartan (COZAAR) 25 MG tablet, Take 1 tablet (25 mg total) by mouth daily., Disp: 90 tablet, Rfl: 1 ?  metFORMIN (GLUCOPHAGE) 500 MG tablet, Take 1 tablet (500 mg total) by mouth at bedtime., Disp: 90 tablet, Rfl: 1 ?  montelukast (SINGULAIR) 10 MG tablet, TAKE 1 TABLET AT BEDTIME, Disp: 90 tablet, Rfl: 0 ?  Multiple Vitamin (MULTIVITAMIN) tablet, Take 1 tablet by mouth daily., Disp: , Rfl:  ?  nirmatrelvir/ritonavir EUA (PAXLOVID) 20 x 150 MG & 10 x '100MG'$  TABS, Take 3 tablets by mouth 2 (two) times daily for 5 days. (Take nirmatrelvir 150 mg two tablets twice daily for 5 days and ritonavir 100 mg one tablet twice daily for 5 days) Patient GFR is > 60 in august, Disp: 30 tablet, Rfl: 0 ?  polyethylene glycol (MIRALAX / GLYCOLAX) packet, Take 17 g by mouth daily., Disp: , Rfl:  ? ?EXAM: ? ?VITALS per patient if applicable: ? ?GENERAL: alert, oriented, appears well and in no acute distress ? ?HEENT: atraumatic, conjunttiva clear, no obvious abnormalities on inspection of external nose and ears ? ?NECK: normal movements of the head and neck ? ?LUNGS: on inspection no signs of respiratory distress, breathing rate appears normal, no obvious gross SOB, gasping or wheezing ? ?CV: no obvious cyanosis ? ?MS: moves all visible extremities without noticeable abnormality ? ?PSYCH/NEURO: pleasant and cooperative, no obvious depression or anxiety, speech and thought processing grossly intact ? ?ASSESSMENT AND PLAN: ? ?Discussed the following assessment and plan: ? ?COVID-19 ? ? Discussed treatment options and risk of drug interactions, ideal treatment window, potential complications, isolation and precautions for COVID-19. Checked for/reviewed  last GFR. ?After lengthy discussion, the patient opted for treatment with Paxlovid due to being higher risk for complications of covid or severe disease and other factors. Discussed EUA status of this drug and the fact that there is preliminary limited knowledge of risks/interactions/side effects per EUA document vs possible benefits and precautions. She repeated back the information regarding interactions with her statin and salmeterol and agrees to hold these until 3 days after tx. She agrees to check with her pulmonologist about alt breathing tx if needed during this time. Currently denies any breathing issues. This information was shared with patient during the visit and also was provided in patient instructions.  ? Other symptomatic care measures summarized in patient instructions.  ?Work/School slipped offered: provided in patient instructions   ?Advised to seek prompt virtual visit or in person care if worsening, new symptoms arise, or if is not improving with treatment as expected per our conversation of expected course. Discussed options for follow up care. Did let this patient know that I do telemedicine on Tuesdays and Thursdays for [facility] and those are the days I am logged into the system. Advised to schedule follow up visit with PCP, [facility] virtual visits or [facility] if any  further questions or concerns to avoid delays in care. ?  ?I discussed the assessment and treatment plan with the patient. The patient was provided an opportunity to ask questions and all were answered. The patient agreed with the plan and demonstrated an understanding of the instructions. ?  ? ? ?Lucretia Kern, DO  ? ?

## 2021-09-17 NOTE — Patient Instructions (Addendum)
? ? ? ?--------------------------------------------------------------------------------------------------------------------------- ? ? ? ?WORK SLIP: ? ?Patient Victoria [name],  [birth year], was seen for a medical visit today, 09/17/21 . Please excuse from work for a COVID/flu like illness. Patient will likely be contagious for 7-10 days on average from the onset of symptoms. Advise home isolation for a minimum of 5 days and until and is feeling better then 59 days of mask wearing - though 10 days is best.  Defer to employer for sooner return to work if symptoms have resolved, it is greater than 5 days since the positive test and the patient can wear a high-quality, tight fitting mask such as N95 or KN95 at all times for an additional 5 days.  ? ?Sincerely: ?E-signature: Dr. Colin Benton, DO ?[facility] ?Ph: [phone] ? ? ?------------------------------------------------------------------------------------------------------------------------------ ? ? ? ? ? ? ? ?HOME CARE TIPS: ? ? ?-I sent the medication(s) we discussed to your pharmacy: ?Meds ordered this encounter  ?Medications  ? nirmatrelvir/ritonavir EUA (PAXLOVID) 20 x 150 MG & 10 x '100MG'$  TABS  ?  Sig: Take 3 tablets by mouth 2 (two) times daily for 5 days. (Take nirmatrelvir 150 mg two tablets twice daily for 5 days and ritonavir 100 mg one tablet twice daily for 5 days) Patient GFR is > 59 in august  ?  Dispense:  30 tablet  ?  Refill:  0  ?  ? ?-I sent in the Covid19 treatment or referral you requested per our discussion. Please see the information provided below and discuss further with the pharmacist/treatment team.  ?-If taking Paxlovid, please review all medications, supplement and over the counter drugs with your pharmacist and ask them to check for any interactions. Please make the following changes to your regular medications while taking Paxlovid: ?*STOP your Advair and your Lipitor for the duration of treatment plus 3  days, restart 3 days after finishing treatment with paxlovid ? ? ?-there is a chance of rebound illness after finishing your treatment. If you become sick again please isolate for an additional 5 days, plus 5 more days of masking.  ? ?-can use tylenol  if needed for fevers, aches and pains per instructions ? ?-can use nasal saline a few times per day if you have nasal congestion ? ?-stay hydrated, drink plenty of fluids and eat small healthy meals - avoid dairy ? ?-follow up with your doctor in 2-3 days unless improving and feeling better ? ?-stay home while sick, except to seek medical care. If you have COVID19, you will likely be contagious for 7-10 days. Flu or Influenza is likely contagious for about 7 days. Other respiratory viral infections remain contagious for 5-10+ days depending on the virus and many other factors. Wear a good mask that fits snugly (such as N95 or KN95) if around others to reduce the risk of transmission. ? ?It was nice to meet you today, and I really hope you are feeling better soon. I help [facility] out with telemedicine visits on Tuesdays and Thursdays and am happy to help if you need a follow up virtual visit on those days. Otherwise, if you have any concerns or questions following this visit please schedule a follow up visit with your Primary Care doctor or seek care at a local urgent care clinic to avoid delays in care.  ? ? ?Seek in person care or schedule a follow up video visit promptly if your symptoms worsen, new concerns arise or you are not improving with treatment. Call [phone]  and/or seek emergency care if your symptoms are severe or life threatening. ? ? ?FACT SHEET FOR PATIENTS, PARENTS, AND CAREGIVERS ?EMERGENCY USE AUTHORIZATION (EUA) OF PAXLOVID ?FOR CORONAVIRUS DISEASE 2019 (COVID-19) ?You are being given this Fact Sheet because your healthcare provider believes it is ?necessary to provide you with PAXLOVID for the treatment of mild-to-moderate ?coronavirus disease  (COVID-19) caused by the SARS-CoV-2 virus. This Fact Sheet ?contains information to help you understand the risks and benefits of taking the ?PAXLOVID you have received or may receive. ?The U.S. Food and Drug Administration (FDA) has issued an Emergency Use ?Authorization (EUA) to make PAXLOVID available during the COVID-19 pandemic (for ?more details about an EUA please see ?What is an Emergency Use Authorization?? ?at the end of this document). PAXLOVID is not an FDA-approved medicine in the ?[place]. Read this Fact Sheet for information about PAXLOVID. Talk to your ?healthcare provider about your options or if you have any questions. It is your choice to ?take PAXLOVID. ? ?What is COVID-19? ?COVID-19 is caused by a virus called a coronavirus. You can get COVID-19 through ?close contact with another person who has the virus. ?COVID-19 illnesses have ranged from very mild-to-severe, including illness resulting in ?death. While information so far suggests that most COVID-19 illness is mild, serious ?illness can happen and may cause some of your other medical conditions to become ?worse. Older people and people of all ages with severe, long lasting (chronic) medical ?conditions like heart disease, lung disease, and diabetes, for example seem to be at ?higher risk of being hospitalized for COVID-19. ? ?What is PAXLOVID? ?PAXLOVID is an investigational medicine used to treat mild-to-moderate COVID-19 in ?adults and children [59 years of age and older weighing at least 34 pounds (51 kg)] with ?positive results of direct SARS-CoV-2 viral testing, and who are at high risk for ?progression to severe COVID-19, including hospitalization or death. PAXLOVID is ?investigational because it is still being studied. There is limited information about the ?safety and effectiveness of using PAXLOVID to treat people with mild-to-moderate ?COVID-19. ? ?The FDA has authorized the emergency use of PAXLOVID for the treatment of  mild-tomoderate COVID-19 in adults and children [59 years of age and older weighing at least ?39 pounds (5 kg)] with a positive test for the virus that causes COVID-19, and who are ?at high risk for progression to severe COVID-19, including hospitalization or death, ?under an EUA. ?1 Revised: 14 September 2020 ?  ?What should I tell my healthcare provider before I take PAXLOVID? ?Tell your healthcare provider if you: ?? Have any allergies ?? Have liver or kidney disease ?? Are pregnant or plan to become pregnant ?? Are breastfeeding a child ?? Have any serious illnesses ? ?Tell your healthcare provider about all the medicines you take, including ?prescription and over-the-counter medicines, vitamins, and herbal supplements. ?Some medicines may interact with PAXLOVID and may cause serious side effects. ?Keep a list of your medicines to show your healthcare provider and pharmacist when ?you get a new medicine. ? ?You can ask your healthcare provider or pharmacist for a list of medicines that interact ?with PAXLOVID. Do not start taking a new medicine without telling your ?healthcare provider. Your healthcare provider can tell you if it is safe to take ?PAXLOVID with other medicines. ? ?Tell your healthcare provider if you are taking combined hormonal contraceptive. ?PAXLOVID may affect how your birth control pills work. Females who are able to ?become pregnant should use another effective alternative form of contraception or an ?  additional barrier method of contraception. Talk to your healthcare provider if you have ?any questions about contraceptive methods that might be right for you. ? ?How do I take PAXLOVID? ?? PAXLOVID consists of 2 medicines: nirmatrelvir and ritonavir. ?o Take 2 pink tablets of nirmatrelvir with 1 white tablet of ritonavir by mouth ?2 times each day (in the morning and in the evening) for 5 days. For each ?dose, take all 3 tablets at the same time. ?o If you have kidney disease, talk to your  healthcare provider. You ?may need a different dose. ?? Swallow the tablets whole. Do not chew, break, or crush the tablets. ?? Take PAXLOVID with or without food. ?? Do not stop taking PAXLOVID without talking to y

## 2021-09-24 ENCOUNTER — Encounter: Payer: Self-pay | Admitting: Family Medicine

## 2021-09-30 ENCOUNTER — Telehealth: Payer: Self-pay | Admitting: Internal Medicine

## 2021-09-30 NOTE — Telephone Encounter (Signed)
Patient called in requesting a phone call from [place] regarding FMLA forms. ? ?Please advise. ?

## 2021-09-30 NOTE — Telephone Encounter (Signed)
Spoke with patient and form placed in Dr Ledell Noss folder ?

## 2021-10-03 ENCOUNTER — Other Ambulatory Visit: Payer: Self-pay | Admitting: Allergy & Immunology

## 2021-10-03 ENCOUNTER — Ambulatory Visit: Payer: 59

## 2021-10-03 DIAGNOSIS — G4733 Obstructive sleep apnea (adult) (pediatric): Secondary | ICD-10-CM

## 2021-10-03 DIAGNOSIS — J4530 Mild persistent asthma, uncomplicated: Secondary | ICD-10-CM

## 2021-10-03 NOTE — Telephone Encounter (Signed)
Form completed, faxed, confirmed, and patient is aware. ?

## 2021-10-10 ENCOUNTER — Telehealth: Payer: Self-pay | Admitting: Pulmonary Disease

## 2021-10-10 DIAGNOSIS — G4733 Obstructive sleep apnea (adult) (pediatric): Secondary | ICD-10-CM | POA: Diagnosis not present

## 2021-10-10 NOTE — Telephone Encounter (Signed)
? ?  HST showed very mild  OSA with AHI 6/ hr ?Weight loss would be the only recommended treatment at this time ?She can try to avoid sleeping on her back ?If worse symptoms, we can reassess ? ?

## 2021-10-10 NOTE — Telephone Encounter (Signed)
Spoke with patient  regarding HST results. They verbalized understanding. No further questions.  Nothing further needed at this time.   

## 2021-10-22 ENCOUNTER — Encounter: Payer: Self-pay | Admitting: Pulmonary Disease

## 2021-10-22 ENCOUNTER — Ambulatory Visit (INDEPENDENT_AMBULATORY_CARE_PROVIDER_SITE_OTHER): Payer: 59 | Admitting: Pulmonary Disease

## 2021-10-22 ENCOUNTER — Other Ambulatory Visit: Payer: Self-pay | Admitting: Internal Medicine

## 2021-10-22 DIAGNOSIS — E1169 Type 2 diabetes mellitus with other specified complication: Secondary | ICD-10-CM

## 2021-10-22 DIAGNOSIS — G4733 Obstructive sleep apnea (adult) (pediatric): Secondary | ICD-10-CM | POA: Diagnosis not present

## 2021-10-22 DIAGNOSIS — J4531 Mild persistent asthma with (acute) exacerbation: Secondary | ICD-10-CM

## 2021-10-22 MED ORDER — MONTELUKAST SODIUM 10 MG PO TABS: 10.0000 mg | ORAL_TABLET | Freq: Every day | ORAL | 0 refills | Status: DC

## 2021-10-22 NOTE — Assessment & Plan Note (Signed)
Her asthma is better controlled now with Advair 250.  We will continue this for 6 months and then attempt stepdown on a steroid dose. ?She will continue on Singulair. ?She will also use Astelin and Flonase for nasal allergies. ?We discussed signs and symptoms of asthma exacerbation should call as needed ?

## 2021-10-22 NOTE — Patient Instructions (Signed)
?  X refills on advair ? ? ?

## 2021-10-22 NOTE — Progress Notes (Signed)
? ?  Subjective:  ? ? Patient ID: Victoria [name], female    DOB: Dec 27, 1960, 61 y.o.   MRN: [id number] ? ?HPI ? ?61 yo for FU of chronic obstructive asthma ?- likely airway remodeling at this point since her obstruction does not seem to be reversible based on serial spirometry in the past ?She reports asthma since childhood, now for more than 40 years.  Review of serial spirometry reports show chronic airway obstruction.  She has last seen Dr. Annamaria Boots in 2017 and since then has been seeing the allergist Dr. Ernst Bowler. ?Has been has noted to be allergic, original skin testing by Dr. Velora Heckler, allergy shots or Xolair trial was not helpful ? ?Triggers: Season changes -cold weather , smoke, pets, virus infections, house dust, strong odors. ?Environment: House, no pets, smokers or mold/water issues. 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 ? ?Initial OV  ?Impression was  -asthma may be worse due to weight gain, GERD or sleep apnea .  She had increased albuterol usage and we stepped up to steroid/LABA.  She cannot afford Symbicort due to high co-pay and end up with generic Advair. ?This is worked well for her, she reports hardly any albuterol usage since her last visit.  Compliant with Advair twice daily. ?She is also on Singulair, take Zyrtec as needed, compliant with Astelin and Flonase ?We reviewed home sleep test today ? ? ?Significant tests/ events reviewed ? ?09/2021 HST showed very mild  OSA with AHI 6/ hr , lowest desat 89%, weight 193 pounds ? ?AEC 01/2021 100 ?  ?Positive allergy testing in 2008 for common environmental allergens.  ?Seasonal and perennial allergic rhinitis (grasses, weeds, trees, molds, dust mite, cat, and cockroach ?  ?Spirometry 08/2020-ratio 57, FEV1 1.21/56%, FVC 2.14/78% ?  ?08/2019-ratio 64, FEV1 1.10/53%, FVC 66% ?  ?2020-ratio 62, FEV1 1.11/50%, FVC 1.79/64% ?  ?2019-ratio 65, FEV1 59%, FVC 73% ?  ? spirometry 07/27/2009  FEV1 1.34/53%, FVC 2.12/68.8%,  FEV1/FEC 0.63. Moderate obstructive airways disease without response to bronchodilator ? ? ?Review of Systems ?neg for any significant sore throat, dysphagia, itching, sneezing, nasal congestion or excess/ purulent secretions, fever, chills, sweats, unintended wt loss, pleuritic or exertional cp, hempoptysis, orthopnea pnd or change in chronic leg swelling. Also denies presyncope, palpitations, heartburn, abdominal pain, nausea, vomiting, diarrhea or change in bowel or urinary habits, dysuria,hematuria, rash, arthralgias, visual complaints, headache, numbness weakness or ataxia. ? ?   ?Objective:  ? Physical Exam ? ?Gen. Pleasant, obese, in no distress ?ENT - no lesions, no post nasal drip ?Neck: No JVD, no thyromegaly, no carotid bruits ?Lungs: no use of accessory muscles, no dullness to percussion, decreased without rales or rhonchi  ?Cardiovascular: Rhythm regular, heart sounds  normal, no murmurs or gallops, no peripheral edema ?Musculoskeletal: No deformities, no cyanosis or clubbing , no tremors ? ? ? ?   ?Assessment & Plan:  ? ? ?

## 2021-10-22 NOTE — Assessment & Plan Note (Signed)
Very mild. ?Weight loss was encouraged as the primary treatment. ?We also discussed positional therapy, avoid sleeping on her back ?

## 2021-11-06 ENCOUNTER — Encounter: Payer: Self-pay | Admitting: Internal Medicine

## 2021-11-06 ENCOUNTER — Ambulatory Visit (INDEPENDENT_AMBULATORY_CARE_PROVIDER_SITE_OTHER): Payer: 59 | Admitting: Internal Medicine

## 2021-11-06 VITALS — BP 124/80 | HR 76 | Temp 98.4°F | Ht 64.0 in | Wt 195.0 lb

## 2021-11-06 DIAGNOSIS — E1129 Type 2 diabetes mellitus with other diabetic kidney complication: Secondary | ICD-10-CM

## 2021-11-06 DIAGNOSIS — E669 Obesity, unspecified: Secondary | ICD-10-CM

## 2021-11-06 DIAGNOSIS — R739 Hyperglycemia, unspecified: Secondary | ICD-10-CM

## 2021-11-06 DIAGNOSIS — E1169 Type 2 diabetes mellitus with other specified complication: Secondary | ICD-10-CM

## 2021-11-06 DIAGNOSIS — I10 Essential (primary) hypertension: Secondary | ICD-10-CM | POA: Diagnosis not present

## 2021-11-06 DIAGNOSIS — R809 Proteinuria, unspecified: Secondary | ICD-10-CM

## 2021-11-06 DIAGNOSIS — E785 Hyperlipidemia, unspecified: Secondary | ICD-10-CM

## 2021-11-06 DIAGNOSIS — Z6831 Body mass index (BMI) 31.0-31.9, adult: Secondary | ICD-10-CM

## 2021-11-06 LAB — LIPID PANEL
Cholesterol: 197 mg/dL (ref 0–200)
HDL: 119.5 mg/dL (ref >39.00)
LDL Cholesterol: 57 mg/dL (ref 0–99)
NonHDL: 77.53
Total CHOL/HDL Ratio: 2
Triglycerides: 101 mg/dL (ref 0.0–149.0)
VLDL: 20.2 mg/dL (ref 0.0–40.0)

## 2021-11-06 LAB — POCT GLYCOSYLATED HEMOGLOBIN (HGB A1C): Hemoglobin A1C: 6 % — AB (ref 4.0–5.6)

## 2021-11-06 NOTE — Progress Notes (Signed)
? ? ? ?Established Patient Office Visit ? ? ? ? ?CC/Reason for Visit: 80-monthfollow-up chronic medical conditions ? ?HPI: [name] a 61y.o. female who is coming in today for the above mentioned reasons. Past Medical History is significant for: Obesity, hypertension, asthma, she has been newly diagnosed with type 2 diabetes and hyperlipidemia.  Since I last saw her she had COVID-19 infection that was treated with Paxlovid successfully.  She is feeling well today and has no acute concerns or complaints.  She was noted to have microalbuminuria and is on losartan. ? ? ?Past Medical/Surgical History: ?Past Medical History:  ?Diagnosis Date  ? Abnormal Pap smear of cervix   ? Allergic rhinitis   ? 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 Asthma   ? Fibroids, subserous   ? GERD (gastroesophageal reflux disease)   ? Headache(784.0)   ? tension- not an issue  ? Hypertension   ? IGT (impaired glucose tolerance) 11/04/2019  ? Torn meniscus 04/2020  ? right  ? Urticaria   ? ? ?Past Surgical History:  ?Procedure Laterality Date  ? BALLOON DILATION N/A 11/19/2015  ? Procedure: BALLOON DILATION;  Surgeon: MGarlan Fair MD;  [facility];  Service: Endoscopy;  Laterality: N/A;  ? BUNIONECTOMY Bilateral   ? COLONOSCOPY WITH PROPOFOL N/A 10/05/2012  ? Procedure: COLONOSCOPY WITH PROPOFOL;  Surgeon: MGarlan Fair MD;  [facility];  Service: Endoscopy;  Laterality: N/A;  ? ESOPHAGOGASTRODUODENOSCOPY (EGD) WITH PROPOFOL N/A 11/19/2015  ? Procedure: ESOPHAGOGASTRODUODENOSCOPY (EGD) WITH PROPOFOL;  Surgeon: MGarlan Fair MD;  [facility]: [facility];  Service: Endoscopy;  Laterality: N/A;  ? TUBAL LIGATION  27 years ago  ? ? ?Social History: ? reports that she has never smoked. She has never used smokeless tobacco. She reports that she does not drink alcohol and does not use drugs. ? ?Allergies: ?Allergies  ?Allergen Reactions  ? Shellfish Allergy Shortness Of Breath and Itching  ? Sulfonamide  Derivatives Itching, Swelling and Other (See Comments)  ?  Throat swells  ? Nasonex [Mometasone Furoate] Other (See Comments)  ?  Nose bleeds  ? ? ?Family History:  ?Family History  ?Problem Relation Age of Onset  ? Emphysema Father   ? Lung cancer Father   ? High Cholesterol Mother   ? Osteoporosis Mother   ? Hypertension Mother   ? Migraines Daughter   ? Hypertension Son   ?     exercise  ? Asthma Son   ? Hypertension Sister   ? COPD Other   ? Breast cancer Neg Hx   ? Allergic rhinitis Neg Hx   ? Angioedema Neg Hx   ? Atopy Neg Hx   ? Eczema Neg Hx   ? Immunodeficiency Neg Hx   ? Urticaria Neg Hx   ? ? ? ?Current Outpatient Medications:  ?  albuterol (PROVENTIL HFA) 108 (90 Base) MCG/ACT inhaler, Inhale two puffs every 4-6 hours if needed for cough or wheeze, Disp: 18 g, Rfl: 1 ?  atorvastatin (LIPITOR) 20 MG tablet, TAKE 1 TABLET(20 MG) BY MOUTH DAILY, Disp: 90 tablet, Rfl: 1 ?  azelastine (ASTELIN) 0.1 % nasal spray, Place 2 sprays into both nostrils 2 (two) times daily. Use in each nostril as directed, Disp: 30 mL, Rfl: 5 ?  cetirizine (ZYRTEC) 10 MG tablet, Take 10 mg by mouth as needed for allergies. , Disp: , Rfl:  ?  cholecalciferol (VITAMIN D3) 25 MCG (1000 UNIT) tablet, Take 1,000 Units by  mouth daily., Disp: , Rfl:  ?  diphenhydrAMINE (BENADRYL) 25 MG tablet, Take 25 mg by mouth at bedtime as needed for allergies or sleep. , Disp: , Rfl:  ?  docusate sodium (COLACE) 100 MG capsule, Take 100 mg by mouth daily., Disp: , Rfl:  ?  EPINEPHrine (AUVI-Q) 0.3 mg/0.3 mL IJ SOAJ injection, Use as directed for severe allergic reaction, Disp: 2 each, Rfl: 2 ?  fluticasone (FLONASE) 50 MCG/ACT nasal spray, Place 2 sprays into both nostrils daily as needed for allergies., Disp: 16 g, Rfl: 3 ?  fluticasone-salmeterol (WIXELA INHUB) 250-50 MCG/ACT AEPB, Inhale 1 puff into the lungs in the morning and at bedtime., Disp: 60 each, Rfl: 3 ?  hydrochlorothiazide (HYDRODIURIL) 25 MG tablet, TAKE 1 TABLET DAILY (LAST REFILL,  NEED APPOINTMENT), Disp: 90 tablet, Rfl: 3 ?  losartan (COZAAR) 25 MG tablet, Take 1 tablet (25 mg total) by mouth daily., Disp: 90 tablet, Rfl: 1 ?  metFORMIN (GLUCOPHAGE) 500 MG tablet, Take 1 tablet (500 mg total) by mouth at bedtime., Disp: 90 tablet, Rfl: 1 ?  montelukast (SINGULAIR) 10 MG tablet, Take 1 tablet (10 mg total) by mouth at bedtime., Disp: 30 tablet, Rfl: 0 ?  Multiple Vitamin (MULTIVITAMIN) tablet, Take 1 tablet by mouth daily., Disp: , Rfl:  ?  polyethylene glycol (MIRALAX / GLYCOLAX) packet, Take 17 g by mouth daily., Disp: , Rfl:  ? ?Review of Systems:  ?Constitutional: Denies fever, chills, diaphoresis, appetite change and fatigue.  ?HEENT: Denies photophobia, eye pain, redness, hearing loss, ear pain, congestion, sore throat, rhinorrhea, sneezing, mouth sores, trouble swallowing, neck pain, neck stiffness and tinnitus.   ?Respiratory: Denies SOB, DOE, cough, chest tightness,  and wheezing.   ?Cardiovascular: Denies chest pain, palpitations and leg swelling.  ?Gastrointestinal: Denies nausea, vomiting, abdominal pain, diarrhea, constipation, blood in stool and abdominal distention.  ?Genitourinary: Denies dysuria, urgency, frequency, hematuria, flank pain and difficulty urinating.  ?Endocrine: Denies: hot or cold intolerance, sweats, changes in hair or nails, polyuria, polydipsia. ?Musculoskeletal: Denies myalgias, back pain, joint swelling, arthralgias and gait problem.  ?Skin: Denies pallor, rash and wound.  ?Neurological: Denies dizziness, seizures, syncope, weakness, light-headedness, numbness and headaches.  ?Hematological: Denies adenopathy. Easy bruising, personal or family bleeding history  ?Psychiatric/Behavioral: Denies suicidal ideation, mood changes, confusion, nervousness, sleep disturbance and agitation ? ? ? ?Physical Exam: ?Vitals:  ? 11/06/21 0705  ?BP: 124/80  ?Pulse: 76  ?Temp: 98.4 ?F (36.9 ?C)  ?TempSrc: Temporal  ?Weight: 195 lb (88.5 kg)  ?Height: '5\' 4"'$  (1.626 m)   ? ? ?Body mass index is 33.47 kg/m?. ? ? ?Constitutional: NAD, calm, comfortable ?Eyes: PERRL, lids and conjunctivae normal, wears corrective lenses ?ENMT: Mucous membranes are moist.  ?Respiratory: clear to auscultation bilaterally, no wheezing, no crackles. Normal respiratory effort. No accessory muscle use.  ?Cardiovascular: Regular rate and rhythm, no murmurs / rubs / gallops. No extremity edema. ?Neurologic: Grossly intact and nonfocal ?Psychiatric: Normal judgment and insight. Alert and oriented x 3. Normal mood.  ? ? ?Impression and Plan: ? ?Hyperglycemia ? ?Type 2 diabetes mellitus with other specified complication, without long-term current use of insulin ([place]) ? - Plan: POCT glycosylated hemoglobin (Hb A1C), Ambulatory referral to diabetic education ?-A1c demonstrates good control at 6.0.  Continue metformin, she had an eye exam in September.  She will be referred to diabetic education. ? ?Microalbuminuria due to type 2 diabetes mellitus ([place]) ?-On losartan. ? ?Primary hypertension ?-Blood pressure is well controlled on hydrochlorothiazide and losartan. ? ?Class 1  obesity without serious comorbidity with body mass index (BMI) of 31.0 to 31.9 in adult, unspecified obesity type ?-Discussed healthy lifestyle, including increased physical activity and better food choices to promote weight loss. ? ?Hyperlipidemia associated with type 2 diabetes mellitus ([place]) ? - Plan: Lipid panel ?-Goal LDL less than 70, currently on atorvastatin 20 mg. ? ? ? ?Time spent:32 minutes reviewing chart, interviewing and examining patient and formulating plan of care. ? ? ?Patient Instructions  ?-Nice seeing you today!! ? ?-Lab work today; will notify you once results are available. ? ?-Schedule follow up for your physical in 3-4 months. Come in fasting that day. ? ? ? ?Lelon Frohlich, MD ?[facility] at [facility] ? ? ?

## 2021-11-06 NOTE — Patient Instructions (Signed)
-  Nice seeing you today!! ? ?-Lab work today; will notify you once results are available. ? ?-Schedule follow up for your physical in 3-4 months. Come in fasting that day. ?

## 2021-11-14 ENCOUNTER — Other Ambulatory Visit: Payer: Self-pay | Admitting: Pulmonary Disease

## 2021-11-28 ENCOUNTER — Telehealth: Payer: Self-pay

## 2021-11-28 NOTE — Telephone Encounter (Signed)
--  Caller c/o right foot cramping off/on with right ankle swelling, S/S started 3 days ago. She is diabetic.  11/26/2021 1:28:27 PM See HCP within 4 Hours (or PCP triage) Markus Daft, RN, [place]  Comments User: Mayford Knife, RN Date/Time Eilene Ghazi Time): 11/26/2021 1:26:28 PM She doesn't check Blood Sugars at home.  User: Mayford Knife, RN Date/Time Eilene Ghazi Time): 11/26/2021 1:29:37 PM Caller states that she is working til 5 pm. She will go to [facility] or ER after work.  Referrals REFERRED TO PCP OFFICE Warm transfer to backline  11/28/21 1136 - Pt states right ankle is no longer swollen, but endorses a cramp. Pt states she it feels worse immediately upon standing & it eases off with walking. States pain is not by her heel but above her heel. Pt states she does not feel like she needs to come in to see provider but wants it noted in her chart, since she is diabetic. Her last OV was 11/06/21; next OV 02/17/22. Will send to PCP for triage. As I was ending the call the pt says her right ankle DOES appear to be slightly swollen.

## 2021-11-28 NOTE — Telephone Encounter (Signed)
Appointment scheduled for 12/05/21.  An earlier appointment was offered but the patient declined.

## 2021-12-05 ENCOUNTER — Encounter: Payer: Self-pay | Admitting: Internal Medicine

## 2021-12-05 ENCOUNTER — Ambulatory Visit (INDEPENDENT_AMBULATORY_CARE_PROVIDER_SITE_OTHER): Payer: 59 | Admitting: Internal Medicine

## 2021-12-05 VITALS — BP 110/80 | HR 90 | Temp 98.3°F | Wt 193.5 lb

## 2021-12-05 DIAGNOSIS — M79604 Pain in right leg: Secondary | ICD-10-CM | POA: Diagnosis not present

## 2021-12-05 DIAGNOSIS — I10 Essential (primary) hypertension: Secondary | ICD-10-CM

## 2021-12-05 LAB — CBC WITH DIFFERENTIAL/PLATELET
Basophils Absolute: 0 10*3/uL (ref 0.0–0.1)
Basophils Relative: 0.7 % (ref 0.0–3.0)
Eosinophils Absolute: 0.3 10*3/uL (ref 0.0–0.7)
Eosinophils Relative: 4.8 % (ref 0.0–5.0)
HCT: 38.3 % (ref 36.0–46.0)
Hemoglobin: 12.7 g/dL (ref 12.0–15.0)
Lymphocytes Relative: 37.4 % (ref 12.0–46.0)
Lymphs Abs: 2 10*3/uL (ref 0.7–4.0)
MCHC: 33.1 g/dL (ref 30.0–36.0)
MCV: 86 fl (ref 78.0–100.0)
Monocytes Absolute: 0.3 10*3/uL (ref 0.1–1.0)
Monocytes Relative: 5.9 % (ref 3.0–12.0)
Neutro Abs: 2.7 10*3/uL (ref 1.4–7.7)
Neutrophils Relative %: 51.2 % (ref 43.0–77.0)
Platelets: 223 10*3/uL (ref 150.0–400.0)
RBC: 4.45 Mil/uL (ref 3.87–5.11)
RDW: 15.4 % (ref 11.5–15.5)
WBC: 5.3 10*3/uL (ref 4.0–10.5)

## 2021-12-05 LAB — COMPREHENSIVE METABOLIC PANEL
ALT: 18 U/L (ref 0–35)
AST: 17 U/L (ref 0–37)
Albumin: 4.2 g/dL (ref 3.5–5.2)
Alkaline Phosphatase: 44 U/L (ref 39–117)
BUN: 15 mg/dL (ref 6–23)
CO2: 30 mEq/L (ref 19–32)
Calcium: 9.8 mg/dL (ref 8.4–10.5)
Chloride: 102 mEq/L (ref 96–112)
Creatinine, Ser: 0.82 mg/dL (ref 0.40–1.20)
GFR: 78.36 mL/min (ref >60.00)
Glucose, Bld: 99 mg/dL (ref 70–99)
Potassium: 3.7 mEq/L (ref 3.5–5.1)
Sodium: 141 mEq/L (ref 135–145)
Total Bilirubin: 0.3 mg/dL (ref 0.2–1.2)
Total Protein: 7 g/dL (ref 6.0–8.3)

## 2021-12-05 LAB — MAGNESIUM: Magnesium: 2 mg/dL (ref 1.5–2.5)

## 2021-12-05 LAB — HM DIABETES EYE EXAM

## 2021-12-05 MED ORDER — HYDROCHLOROTHIAZIDE 25 MG PO TABS: 25.0000 mg | ORAL_TABLET | Freq: Every day | ORAL | 1 refills | Status: DC

## 2021-12-05 NOTE — Progress Notes (Signed)
Established Patient Office Visit     CC/Reason for Visit: Discuss right leg pain  HPI: Victoria [name] is a 61 y.o. female who is coming in today for the above mentioned reasons. Past Medical History is significant for: Type 2 diabetes, hypertension, hyperlipidemia and asthma.  For the past week she has been noticing right ankle and calf pain.  She states it is worse with rest but is also present with exertion.  There is maybe some mild swelling around the ankle but it is not significant.  Her right toes feel cold at times.  This does not extend to her left leg.   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   Asthma    Fibroids, subserous    GERD (gastroesophageal reflux disease)    Headache(784.0)    tension- not an issue   Hypertension    IGT (impaired glucose tolerance) 11/04/2019   Torn meniscus 04/2020   right   Urticaria     Past Surgical History:  Procedure Laterality Date   BALLOON DILATION N/A 11/19/2015   Procedure: BALLOON DILATION;  Surgeon: Garlan Fair, MD;  [facility];  Service: Endoscopy;  Laterality: N/A;   BUNIONECTOMY Bilateral    COLONOSCOPY WITH PROPOFOL N/A 10/05/2012   Procedure: COLONOSCOPY WITH PROPOFOL;  Surgeon: Garlan Fair, MD;  [facility];  Service: Endoscopy;  Laterality: N/A;   ESOPHAGOGASTRODUODENOSCOPY (EGD) WITH PROPOFOL N/A 11/19/2015   Procedure: ESOPHAGOGASTRODUODENOSCOPY (EGD) WITH PROPOFOL;  Surgeon: Garlan Fair, MD;  [facility];  Service: Endoscopy;  Laterality: N/A;   TUBAL LIGATION  27 years ago    Social History:  reports that she has never smoked. She has never used smokeless tobacco. She reports that she does not drink alcohol and does not use drugs.  Allergies: Allergies  Allergen Reactions   Shellfish Allergy Shortness Of Breath and Itching   Sulfonamide  Derivatives Itching, Swelling and Other (See Comments)    Throat swells   Nasonex [Mometasone Furoate] Other (See Comments)    Nose bleeds    Family History:  Family History  Problem Relation Age of Onset   Emphysema Father    Lung cancer Father    High Cholesterol Mother    Osteoporosis Mother    Hypertension Mother    Migraines Daughter    Hypertension Son        exercise   Asthma Son    Hypertension Sister    COPD Other    Breast cancer Neg Hx    Allergic rhinitis Neg Hx    Angioedema Neg Hx    Atopy Neg Hx    Eczema Neg Hx    Immunodeficiency Neg Hx    Urticaria Neg Hx      Current Outpatient Medications:    albuterol (PROVENTIL HFA) 108 (90 Base) MCG/ACT inhaler, Inhale two puffs every 4-6 hours if needed for cough or wheeze, Disp: 18 g, Rfl: 1   atorvastatin (LIPITOR) 20 MG tablet, TAKE 1 TABLET(20 MG) BY MOUTH DAILY, Disp: 90 tablet, Rfl: 1   azelastine (ASTELIN) 0.1 % nasal spray, PLACE 2 SPRAYS INTO BOTH NOSTRILS TWICE DAILY, Disp: 30 mL, Rfl: 5   cetirizine (ZYRTEC) 10 MG tablet, Take 10 mg by mouth as needed for allergies. , Disp: , Rfl:    cholecalciferol (VITAMIN D3) 25 MCG (1000 UNIT) tablet,  Take 1,000 Units by mouth daily., Disp: , Rfl:    diphenhydrAMINE (BENADRYL) 25 MG tablet, Take 25 mg by mouth at bedtime as needed for allergies or sleep. , Disp: , Rfl:    docusate sodium (COLACE) 100 MG capsule, Take 100 mg by mouth daily., Disp: , Rfl:    EPINEPHrine (AUVI-Q) 0.3 mg/0.3 mL IJ SOAJ injection, Use as directed for severe allergic reaction, Disp: 2 each, Rfl: 2   fluticasone (FLONASE) 50 MCG/ACT nasal spray, Place 2 sprays into both nostrils daily as needed for allergies., Disp: 16 g, Rfl: 3   fluticasone-salmeterol (WIXELA INHUB) 250-50 MCG/ACT AEPB, Inhale 1 puff into the lungs in the morning and at bedtime., Disp: 60 each, Rfl: 3   losartan (COZAAR) 25 MG tablet, Take 1 tablet (25 mg total) by mouth daily., Disp: 90 tablet, Rfl: 1   metFORMIN  (GLUCOPHAGE) 500 MG tablet, Take 1 tablet (500 mg total) by mouth at bedtime., Disp: 90 tablet, Rfl: 1   montelukast (SINGULAIR) 10 MG tablet, Take 1 tablet (10 mg total) by mouth at bedtime., Disp: 30 tablet, Rfl: 0   Multiple Vitamin (MULTIVITAMIN) tablet, Take 1 tablet by mouth daily., Disp: , Rfl:    polyethylene glycol (MIRALAX / GLYCOLAX) packet, Take 17 g by mouth daily., Disp: , Rfl:    hydrochlorothiazide (HYDRODIURIL) 25 MG tablet, Take 1 tablet (25 mg total) by mouth daily., Disp: 90 tablet, Rfl: 1  Review of Systems:  Constitutional: Denies fever, chills, diaphoresis, appetite change and fatigue.  HEENT: Denies photophobia, eye pain, redness, hearing loss, ear pain, congestion, sore throat, rhinorrhea, sneezing, mouth sores, trouble swallowing, neck pain, neck stiffness and tinnitus.   Respiratory: Denies SOB, DOE, cough, chest tightness,  and wheezing.   Cardiovascular: Denies chest pain, palpitations. Gastrointestinal: Denies nausea, vomiting, abdominal pain, diarrhea, constipation, blood in stool and abdominal distention.  Genitourinary: Denies dysuria, urgency, frequency, hematuria, flank pain and difficulty urinating.  Endocrine: Denies: hot or cold intolerance, sweats, changes in hair or nails, polyuria, polydipsia. Musculoskeletal: Denies  back pain, joint swelling, arthralgias and gait problem.  Skin: Denies pallor, rash and wound.  Neurological: Denies dizziness, seizures, syncope, weakness, light-headedness, numbness and headaches.  Hematological: Denies adenopathy. Easy bruising, personal or family bleeding history  Psychiatric/Behavioral: Denies suicidal ideation, mood changes, confusion, nervousness, sleep disturbance and agitation    Physical Exam: Vitals:   12/05/21 0700  BP: 110/80  Pulse: 90  Temp: 98.3 F (36.8 C)  TempSrc: Oral  SpO2: 100%  Weight: 193 lb 8 oz (87.8 kg)    Body mass index is 33.21 kg/m.   Constitutional: NAD, calm,  comfortable Eyes: PERRL, lids and conjunctivae normal ENMT: Mucous membranes are moist.  Musculoskeletal: no clubbing / cyanosis. No joint deformity upper and lower extremities. Good ROM, no contractures. Normal muscle tone.  No significant edema of lower extremities, DP and TP pulses of the right foot are diminished.  Compared to the left Skin: no rashes, lesions, ulcers. No induration Neurologic: CN 2-12 grossly intact. Sensation intact, DTR normal. Strength 5/5 in all 4.  Psychiatric: Normal judgment and insight. Alert and oriented x 3. Normal mood.    Impression and Plan:  Right leg pain  - Plan: CBC with Differential/Platelet, Comprehensive metabolic panel, Magnesium, VAS [place] ABI WITH/WO TBI, Magnesium, Comprehensive metabolic panel, CBC with Differential/Platelet -I will check electrolytes and replace as needed. -I am also concerned about possibility of PAD given her risk factors and what I perceive as decreased pulses on the right.  ABIs have been ordered. -If above is negative can consider diabetic peripheral neuropathy.  Essential hypertension  - Plan: hydrochlorothiazide (HYDRODIURIL) 25 MG tablet    Time spent:31 minutes reviewing chart, interviewing and examining patient and formulating plan of care.     Lelon Frohlich, MD [facility] at [facility]

## 2021-12-10 ENCOUNTER — Other Ambulatory Visit: Payer: Self-pay | Admitting: Primary Care

## 2021-12-12 ENCOUNTER — Ambulatory Visit (INDEPENDENT_AMBULATORY_CARE_PROVIDER_SITE_OTHER): Payer: 59 | Admitting: Family Medicine

## 2021-12-12 ENCOUNTER — Telehealth: Payer: Self-pay | Admitting: Internal Medicine

## 2021-12-12 ENCOUNTER — Other Ambulatory Visit: Payer: Self-pay | Admitting: Internal Medicine

## 2021-12-12 DIAGNOSIS — E119 Type 2 diabetes mellitus without complications: Secondary | ICD-10-CM | POA: Diagnosis not present

## 2021-12-12 DIAGNOSIS — M79604 Pain in right leg: Secondary | ICD-10-CM

## 2021-12-12 DIAGNOSIS — M79606 Pain in leg, unspecified: Secondary | ICD-10-CM

## 2021-12-12 NOTE — Telephone Encounter (Signed)
Pt calling in stating a referral to someone regarding the swelling and throbbing in her right foot was discussed at the last appointment, she is requesting an update.

## 2021-12-12 NOTE — Progress Notes (Signed)
Medical Nutrition Therapy Appt start time: 1430 end time: 0932 (1 hour) Primary concerns today: Blood sugar control.   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   Also has dx's of HTN, OSA, chr constipa, and food allergy.  Ht 60.75; wt 136, BMI 26.05 on 11/07/21.   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  She lives with her husband.  Eats restaurant or takeout food at least 5 meals a week.    Learning Readiness: Ready; lost ~20 lb, but reverted to old habits, and regained weight; however, A1c has dropped from 6.5 (January) to 6.0 on 11/06/21.    Usual eating pattern: 2-3 meals and 2 snacks per day. Frequent foods and beverages: 1-2 c coffee with 1 tsp powdered creamer, ~12 oz diet soda, water; Multigrain Cheerios, lactose-free fat-free milk, salad/veg's, meat, chx, fruit.   Avoided foods: shellfish (allergic), lactose-intolerance.   Usual physical activity: Had started walking, but has ankle swelling currently.   Sleep: Estimates 4-5 hrs per night.  Usually gets up 2-3 X night.    24-hr recall: (Up at 6:15 AM) B (6:50 AM)-   1 c coffee, 1 tsp creamer, 2 c Cheerios Oat Crunch, 3/4 c lactose-free fat-free milk, water Snk ( AM)-   mint water L (1:20 PM)-  1 veggie cheeseburger, 1 c fries, water Snk (6 PM)-  2 c popcorn Snk (7:15)-  1 c coffee, 1 tsp creamer,  D (8:30 PM)-  1 Popeye[employer] chx sandwich (breaded chx, pickle, ranch), 1/2 c red beans&rice, diet soda Snk ( PM)-  water Typical day? No. 2 large meals is unusual; seldom has bread 2 X day.    Nutritional Diagnosis:  NI-5.8.2 Excessive carbohydrate intake As related to imbalanced meals.  As evidenced by diet hx and recall indicating usual intake of twice the recommended volume of carb at meals.  Handouts given during visit include: After-Visit Summary (AVS) Goals Tracker form  Demonstrated degree of understanding via:   Teach Back  Barriers to learning/adherence to lifestyle change: Longstanding dietary practices.  Monitoring/Evaluation:  Dietary intake, exercise, and body weight in 3 month(s).

## 2021-12-12 NOTE — Patient Instructions (Signed)
[place]:  [phone].  Try to drink most of your fluids by 2 PM.     Diet Recommendations for Diabetes  Carbohydrate includes starch, sugar, and fiber.  Of these, only sugar and starch raise blood glucose.  (Fiber is found in fruits, vegetables [especially skin, seeds, and stalks], whole grains, and beans.)   Starchy (carb) foods: Bread, rice, pasta, potatoes, corn, cereal, grits, crackers, bagels, muffins, all baked goods.  (Fruit, milk, and yogurt also have carbohydrate, but most of these foods will not spike your blood sugar as most starchy or sweet foods will.)  A few fruits do cause high blood sugars; use small portions of bananas (limit to 1/2 at a time), grapes, watermelon, and oranges.   Protein foods: Meat, fish, poultry, eggs, dairy foods, and beans such as pinto and kidney beans (beans also provide carbohydrate).   1. Eat at least 3 REAL meals and 1-2 snacks per day.  Eat breakfast within one hour of getting up.  Have something to eat at least every 5 hours while awake.  - A REAL meal for lunch or dinner includes at least some protein, some starch, and vegetables and/or fruit.   - A REAL breakfast needs to include both starch and protein foods.     2. Limit starchy foods to TWO portions per meal and ONE per snack. ONE portion of a starchy food is equal to the following:  - ONE slice of bread (or its equivalent, such as half of a hamburger bun).  - 1/2 cup of a "scoopable" starchy food such as potatoes or rice.  - 15 grams of Total Carbohydrate as shown on food label.  - 4 ounces of a sweet drink (including fruit juice).  3. Include twice the volume of vegetables as protein or carbohydrate foods for BOTH lunch and dinner 7 times per week.  - Fresh or frozen vegetables are best.  - Keep frozen vegetables on hand for a quick option.     Document progress on your goals above using the Goals Tracker provided today.    Follow-up appt on Thursday, September 14 at 4  PM.

## 2021-12-16 ENCOUNTER — Other Ambulatory Visit: Payer: Self-pay | Admitting: Internal Medicine

## 2021-12-16 ENCOUNTER — Encounter: Payer: Self-pay | Admitting: Internal Medicine

## 2021-12-16 DIAGNOSIS — E1169 Type 2 diabetes mellitus with other specified complication: Secondary | ICD-10-CM

## 2021-12-16 DIAGNOSIS — I739 Peripheral vascular disease, unspecified: Secondary | ICD-10-CM

## 2021-12-16 DIAGNOSIS — M79604 Pain in right leg: Secondary | ICD-10-CM

## 2021-12-16 NOTE — Telephone Encounter (Signed)
Left message on machine for [facility] to return our call for help entering the correct code.

## 2021-12-16 NOTE — Telephone Encounter (Signed)
Victoria [name] will [facility] is calling and ABI order is not attach to them.

## 2021-12-17 ENCOUNTER — Other Ambulatory Visit: Payer: Self-pay | Admitting: Internal Medicine

## 2021-12-17 ENCOUNTER — Ambulatory Visit
Admission: RE | Admit: 2021-12-17 | Discharge: 2021-12-17 | Disposition: A | Discharge status: Home / Self Care | Payer: 59 | Source: Ambulatory Visit | Attending: Internal Medicine | Admitting: Internal Medicine

## 2021-12-17 DIAGNOSIS — I739 Peripheral vascular disease, unspecified: Secondary | ICD-10-CM

## 2021-12-17 DIAGNOSIS — M79606 Pain in leg, unspecified: Secondary | ICD-10-CM

## 2021-12-17 DIAGNOSIS — M79604 Pain in right leg: Secondary | ICD-10-CM

## 2021-12-17 NOTE — Telephone Encounter (Signed)
Spoke with someone at [facility] and order placed

## 2021-12-24 ENCOUNTER — Encounter: Payer: Self-pay | Admitting: Internal Medicine

## 2022-01-02 ENCOUNTER — Other Ambulatory Visit: Payer: Self-pay | Admitting: *Deleted

## 2022-01-03 ENCOUNTER — Ambulatory Visit
Admission: RE | Admit: 2022-01-03 | Discharge: 2022-01-03 | Disposition: A | Discharge status: Home / Self Care | Payer: 59 | Source: Ambulatory Visit | Attending: Internal Medicine | Admitting: Internal Medicine

## 2022-01-03 DIAGNOSIS — I739 Peripheral vascular disease, unspecified: Secondary | ICD-10-CM

## 2022-01-03 DIAGNOSIS — M79604 Pain in right leg: Secondary | ICD-10-CM

## 2022-01-09 ENCOUNTER — Ambulatory Visit (INDEPENDENT_AMBULATORY_CARE_PROVIDER_SITE_OTHER): Payer: 59 | Admitting: Podiatry

## 2022-01-09 ENCOUNTER — Encounter: Payer: Self-pay | Admitting: Podiatry

## 2022-01-09 DIAGNOSIS — M76821 Posterior tibial tendinitis, right leg: Secondary | ICD-10-CM | POA: Diagnosis not present

## 2022-01-09 MED ORDER — MELOXICAM 15 MG PO TABS: 15.0000 mg | ORAL_TABLET | Freq: Every day | ORAL | 0 refills | Status: DC

## 2022-01-09 NOTE — Progress Notes (Signed)
  Subjective:  Patient ID: Victoria [name], female    DOB: 09/10/1960,   MRN: [id number]  Chief Complaint  Patient presents with   Foot Pain    Right foot pain has been ongoing with swelling    61 y.o. female presents for concern of right foot pain that has been going on for about three weeks. Denies any injury. Relates the pain is shooting up into her ankle. Denies any treatments besides some ibuprofen with minimal relief.  She is diabetic and her last A1c was  6 on 11/06/21 Has had ultrasound preformed to rule out DVT.  Denies any other pedal complaints. Denies n/v/f/c.   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 CFT <3 seconds. Normal hair growth on digits. No edema.  Skin. No lacerations or abrasions bilateral feet.  Musculoskeletal: MMT 5/5 bilateral lower extremities in DF, PF, Inversion and Eversion. Deceased ROM in DF of ankle joint. Tender to insertion of PT tendon and along course of tendon just proximal to the tip of the medial malleolus. Pain with PF and inversion of the foot as well. No pain along calcaneal tubercle or achilles tendon. No pain with palpation in calf area.  Neurological: Sensation intact to light touch.   Assessment:   1. Posterior tibial tendon dysfunction (PTTD) of right lower extremity      Plan:  Patient was evaluated and treated and all questions answered. X-rays reviewed and discussed with patient. Discussed PTTD diagnosis and treatment options with patient. Stretching exercises discussed and handout dispensed. Prescription for meloxicam provided. Has brace at home  that she will try to support ankle. Discussed support in shoes as well.  Discussed if there is no improvement PT/MRI/injection may be an option. Patient to return to clinic in 6 to 8 weeks or sooner if symptoms fail to improve or worsen.   Lorenda Peck, DPM

## 2022-01-09 NOTE — Patient Instructions (Signed)
Posterior Tibial Tendinitis Rehab Ask your health care provider which exercises are safe for you. Do exercises exactly as told by your health care provider and adjust them as directed. It is normal to feel mild stretching, pulling, tightness, or discomfort as you do these exercises. Stop right away if you feel sudden pain or your pain gets worse. Do not begin these exercises until told by your health care provider. Stretching and range-of-motion exercises These exercises warm up your muscles and joints and improve the movement and flexibility in your ankle and foot. These exercises may also help to relieve pain. Standing wall calf stretch, knee straight  Stand with your hands against a wall. Extend your left / right leg behind you, and bend your front knee slightly. If directed, place a folded washcloth under the arch of your foot for support. Point the toes of your back foot slightly inward. Keeping your heels on the floor and your back knee straight, shift your weight toward the wall. Do not allow your back to arch. You should feel a gentle stretch in your upper left / right calf. Hold this position for __________ seconds. Repeat __________ times. Complete this exercise __________ times a day. Standing wall calf stretch, knee bent Stand with your hands against a wall. Extend your left / right leg behind you, and bend your front knee slightly. If directed, place a folded washcloth under the arch of your foot for support. Point the toes of your back foot slightly inward. Unlock your back knee so it is bent. Keep your heels on the floor. You should feel a gentle stretch deep in your lower left / right calf. Hold this position for __________ seconds. Repeat __________ times. Complete this exercise __________ times a day. Strengthening exercises These exercises build strength and endurance in your ankle and foot. Endurance is the ability to use your muscles for a long time, even after they get  tired. Ankle inversion with band Secure one end of a rubber exercise band or tubing to a fixed object, such as a table leg or a pole, that will stay still when the band is pulled. Loop the other end of the band around the middle of your left / right foot. Sit on the floor facing the object with your left / right leg extended. The band or tube should be slightly tense when your foot is relaxed. Leading with your big toe, slowly bring your left / right foot and ankle inward, toward your other foot (inversion). Hold this position for __________ seconds. Slowly return your foot to the starting position. Repeat __________ times. Complete this exercise __________ times a day. Towel curls  Sit in a chair on a non-carpeted surface, and put your feet on the floor. Place a towel in front of your feet. If told by your health care provider, add a __________ weight to the end of the towel. Keeping your heel on the floor, put your left / right foot on the towel. Pull the towel toward you by grabbing the towel with your toes and curling them under. Keep your heel on the floor while you do this. Let your toes relax. Grab the towel with your toes again. Keep going until the towel is completely underneath your foot. Repeat __________ times. Complete this exercise __________ times a day. Balance exercise This exercise improves or maintains your balance. Balance is important in preventing falls. Single leg stand Without wearing shoes, stand near a railing or in a doorway. You may hold   on to the railing or door frame as needed for balance. Stand on your left / right foot. Keep your big toe down on the floor and try to keep your arch lifted. If balancing in this position is too easy, try the exercise with your eyes closed or while standing on a pillow. Hold this position for __________ seconds. Repeat __________ times. Complete this exercise __________ times a day. This information is not intended to replace  advice given to you by your health care provider. Make sure you discuss any questions you have with your health care provider. Document Revised: 10/12/2018 Document Reviewed: 08/18/2018 Elsevier Patient Education  2023 [employer]  

## 2022-01-10 ENCOUNTER — Telehealth: Payer: Self-pay | Admitting: Pulmonary Disease

## 2022-01-10 ENCOUNTER — Other Ambulatory Visit: Payer: Self-pay | Admitting: Pulmonary Disease

## 2022-01-10 MED ORDER — MONTELUKAST SODIUM 10 MG PO TABS: ORAL_TABLET | ORAL | 11 refills | Status: DC

## 2022-01-10 NOTE — Telephone Encounter (Signed)
Called patient and verified medication that she needed refilled and pharmacy. Nothing further needed

## 2022-01-14 ENCOUNTER — Telehealth: Payer: Self-pay | Admitting: *Deleted

## 2022-01-14 NOTE — Telephone Encounter (Signed)
Please advise,has stopped taking medication, will be taking ibuprofen for time being.

## 2022-01-14 NOTE — Telephone Encounter (Signed)
Patient is calling because she thinks she is having a reaction (eyes swelling)to medication prescribed(Mobic), please advise.

## 2022-01-22 ENCOUNTER — Telehealth: Payer: Self-pay

## 2022-01-23 ENCOUNTER — Telehealth: Payer: Self-pay | Admitting: Internal Medicine

## 2022-01-23 NOTE — Telephone Encounter (Signed)
Patient is aware 

## 2022-01-23 NOTE — Telephone Encounter (Signed)
Yes lets try and get her scheduled sooner.

## 2022-01-23 NOTE — Telephone Encounter (Signed)
Pt was seen in June for right foot that is still swollen and burning. Pt seen podiatry and was given meloxicam and had a reaction and she stopped taking medication and has notified the podiatrist. Pt did not want to make an appt with dr Jerilee Hoh unless md recommends. Please advise

## 2022-01-29 ENCOUNTER — Ambulatory Visit (INDEPENDENT_AMBULATORY_CARE_PROVIDER_SITE_OTHER): Payer: 59 | Admitting: Podiatry

## 2022-01-29 ENCOUNTER — Encounter: Payer: Self-pay | Admitting: Podiatry

## 2022-01-29 DIAGNOSIS — M76821 Posterior tibial tendinitis, right leg: Secondary | ICD-10-CM | POA: Diagnosis not present

## 2022-01-29 NOTE — Progress Notes (Signed)
  Subjective:  Patient ID: Victoria [name], female    DOB: 10-20-1960,   MRN: [id number]  Chief Complaint  Patient presents with   Foot Pain    Right foot pain has gotten worst since the last visit , patient states she did stop taking meloxicam. Patient is still experiencing burning sensation and swelling. Patient states when she is in pain she is taking otc medications and icing.    61 y.o. female presents for follow-up of right foot pain. Patient relates an allergic reaction to the meloxicam and has stopped taking and taking ibuprofen instead. States she has continued to have pain in the right foot.   Relates the pain is shooting up into her ankle.  She is diabetic and her last A1c was  6 on 11/06/21 Has had ultrasound preformed to rule out DVT.  Denies any other pedal complaints. Denies n/v/f/c.   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 CFT <3 seconds. Normal hair growth on digits. No edema.  Skin. No lacerations or abrasions bilateral feet.  Musculoskeletal: MMT 5/5 bilateral lower extremities in DF, PF, Inversion and Eversion. Deceased ROM in DF of ankle joint. Tender to insertion of PT tendon and along course of tendon just proximal to the tip of the medial malleolus. Pain with PF and inversion of the foot as well. No pain along calcaneal tubercle or achilles tendon. No pain with palpation in calf area.  Neurological: Sensation intact to light touch.   Assessment:   1. Posterior tibial tendon dysfunction (PTTD) of right lower extremity       Plan:  Patient was evaluated and treated and all  questions answered. X-rays reviewed and discussed with patient. Discussed PTTD diagnosis and treatment options with patient. Continue stretching exercises. Continue with brace. Discussed support in shoes as well.  Discussed injection but due to history of mometasone allergy will avoid any injection to avoid future reactions at this point.  CAM boot dispensed to offload area.  Discussed if there is no improvement PT/MRI may be an option. Patient to return to clinic in 6 to 8 weeks or sooner if symptoms fail to improve or worsen.    Lorenda Peck, DPM

## 2022-02-17 ENCOUNTER — Ambulatory Visit (INDEPENDENT_AMBULATORY_CARE_PROVIDER_SITE_OTHER): Payer: 59 | Admitting: Internal Medicine

## 2022-02-17 ENCOUNTER — Telehealth: Payer: Self-pay | Admitting: Internal Medicine

## 2022-02-17 ENCOUNTER — Encounter: Payer: Self-pay | Admitting: Internal Medicine

## 2022-02-17 VITALS — BP 102/74 | HR 65 | Temp 98.2°F | Ht 65.0 in | Wt 189.8 lb

## 2022-02-17 DIAGNOSIS — R809 Proteinuria, unspecified: Secondary | ICD-10-CM

## 2022-02-17 DIAGNOSIS — I10 Essential (primary) hypertension: Secondary | ICD-10-CM

## 2022-02-17 DIAGNOSIS — J4520 Mild intermittent asthma, uncomplicated: Secondary | ICD-10-CM

## 2022-02-17 DIAGNOSIS — E1169 Type 2 diabetes mellitus with other specified complication: Secondary | ICD-10-CM

## 2022-02-17 DIAGNOSIS — Z0000 Encounter for general adult medical examination without abnormal findings: Secondary | ICD-10-CM

## 2022-02-17 DIAGNOSIS — E1129 Type 2 diabetes mellitus with other diabetic kidney complication: Secondary | ICD-10-CM | POA: Diagnosis not present

## 2022-02-17 LAB — CBC WITH DIFFERENTIAL/PLATELET
Basophils Absolute: 0 10*3/uL (ref 0.0–0.1)
Basophils Relative: 0.6 % (ref 0.0–3.0)
Eosinophils Absolute: 0.2 10*3/uL (ref 0.0–0.7)
Eosinophils Relative: 4.5 % (ref 0.0–5.0)
HCT: 39.4 % (ref 36.0–46.0)
Hemoglobin: 13 g/dL (ref 12.0–15.0)
Lymphocytes Relative: 43.5 % (ref 12.0–46.0)
Lymphs Abs: 2 10*3/uL (ref 0.7–4.0)
MCHC: 32.9 g/dL (ref 30.0–36.0)
MCV: 86.3 fl (ref 78.0–100.0)
Monocytes Absolute: 0.3 10*3/uL (ref 0.1–1.0)
Monocytes Relative: 6.4 % (ref 3.0–12.0)
Neutro Abs: 2.1 10*3/uL (ref 1.4–7.7)
Neutrophils Relative %: 45 % (ref 43.0–77.0)
Platelets: 211 10*3/uL (ref 150.0–400.0)
RBC: 4.57 Mil/uL (ref 3.87–5.11)
RDW: 15.1 % (ref 11.5–15.5)
WBC: 4.6 10*3/uL (ref 4.0–10.5)

## 2022-02-17 LAB — VITAMIN D 25 HYDROXY (VIT D DEFICIENCY, FRACTURES): VITD: 31.41 ng/mL (ref 30.00–100.00)

## 2022-02-17 LAB — LIPID PANEL
Cholesterol: 191 mg/dL (ref 0–200)
HDL: 99.7 mg/dL (ref >39.00)
LDL Cholesterol: 67 mg/dL (ref 0–99)
NonHDL: 91.11
Total CHOL/HDL Ratio: 2
Triglycerides: 119 mg/dL (ref 0.0–149.0)
VLDL: 23.8 mg/dL (ref 0.0–40.0)

## 2022-02-17 LAB — COMPREHENSIVE METABOLIC PANEL
ALT: 16 U/L (ref 0–35)
AST: 20 U/L (ref 0–37)
Albumin: 4.4 g/dL (ref 3.5–5.2)
Alkaline Phosphatase: 45 U/L (ref 39–117)
BUN: 14 mg/dL (ref 6–23)
CO2: 31 mEq/L (ref 19–32)
Calcium: 9.7 mg/dL (ref 8.4–10.5)
Chloride: 103 mEq/L (ref 96–112)
Creatinine, Ser: 0.87 mg/dL (ref 0.40–1.20)
GFR: 72.88 mL/min (ref >60.00)
Glucose, Bld: 98 mg/dL (ref 70–99)
Potassium: 3.8 mEq/L (ref 3.5–5.1)
Sodium: 142 mEq/L (ref 135–145)
Total Bilirubin: 0.4 mg/dL (ref 0.2–1.2)
Total Protein: 7.1 g/dL (ref 6.0–8.3)

## 2022-02-17 LAB — VITAMIN B12: Vitamin B-12: 510 pg/mL (ref 211–911)

## 2022-02-17 LAB — TSH: TSH: 1.87 u[IU]/mL (ref 0.35–5.50)

## 2022-02-17 LAB — HEMOGLOBIN A1C: Hgb A1c MFr Bld: 6.4 % (ref 4.6–6.5)

## 2022-02-17 NOTE — Telephone Encounter (Signed)
Reviewed lab results with patient.

## 2022-02-17 NOTE — Progress Notes (Signed)
Established Patient Office Visit     CC/Reason for Visit: Annual preventive exam  HPI: Victoria [name] is a 61 y.o. female who is coming in today for the above mentioned reasons. Past Medical History is significant for: Type 2 diabetes, hypertension, hyperlipidemia, mild intermittent asthma.  She is feeling well and has no acute concerns or complaints.  She has routine eye and dental care, in fact had her diabetic eye exam in June.  She had a recall mammogram and March and is scheduled again for September.  She has a GYN and had a Pap smear in 2021.  She had a colonoscopy in 2014 and was a 10-year follow-up.  She is now due for bivalent COVID and flu vaccines.   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   Asthma    Fibroids, subserous    GERD (gastroesophageal reflux disease)    Headache(784.0)    tension- not an issue   Hypertension    IGT (impaired glucose tolerance) 11/04/2019   Torn meniscus 04/2020   right   Urticaria     Past Surgical History:  Procedure Laterality Date   BALLOON DILATION N/A 11/19/2015   Procedure: BALLOON DILATION;  Surgeon: Garlan Fair, MD;  Location: [facility];  Service: Endoscopy;  Laterality: N/A;   BUNIONECTOMY Bilateral    COLONOSCOPY WITH PROPOFOL N/A 10/05/2012   Procedure: COLONOSCOPY WITH PROPOFOL;  Surgeon: Garlan Fair, MD;  Location: [facility];  Service: Endoscopy;  Laterality: N/A;   ESOPHAGOGASTRODUODENOSCOPY (EGD) WITH PROPOFOL N/A 11/19/2015   Procedure: ESOPHAGOGASTRODUODENOSCOPY (EGD) WITH PROPOFOL;  Surgeon: Garlan Fair, MD;  Location: [facility];  Service: Endoscopy;  Laterality: N/A;   TUBAL LIGATION  27 years ago    Social History:  reports that she has never smoked. She has never used smokeless tobacco. She reports that she does not drink alcohol and does not use  drugs.  Allergies: Allergies  Allergen Reactions   Shellfish Allergy Shortness Of Breath and Itching   Sulfonamide Derivatives Itching, Swelling and Other (See Comments)    Throat swells   Mobic [Meloxicam] Swelling   Nasonex [Mometasone Furoate] Other (See Comments)    Nose bleeds    Family History:  Family History  Problem Relation Age of Onset   Emphysema Father    Lung cancer Father    High Cholesterol Mother    Osteoporosis Mother    Hypertension Mother    Migraines Daughter    Hypertension Son        exercise   Asthma Son    Hypertension Sister    COPD Other    Breast cancer Neg Hx    Allergic rhinitis Neg Hx    Angioedema Neg Hx    Atopy Neg Hx    Eczema Neg Hx    Immunodeficiency Neg Hx    Urticaria Neg Hx      Current Outpatient Medications:    ADVAIR DISKUS 250-50 MCG/ACT AEPB, INHALE 1 PUFF INTO THE LUNGS IN THE MORNING AND AT BEDTIME, Disp: 60 each, Rfl: 3   albuterol (PROVENTIL HFA) 108 (90 Base) MCG/ACT inhaler, Inhale two puffs every 4-6 hours if needed for cough or wheeze, Disp: 18 g, Rfl: 1   atorvastatin (LIPITOR) 20 MG tablet, TAKE 1 TABLET(20 MG) BY MOUTH DAILY, Disp: 90 tablet, Rfl: 1   azelastine (ASTELIN) 0.1 %  nasal spray, PLACE 2 SPRAYS INTO BOTH NOSTRILS TWICE DAILY, Disp: 30 mL, Rfl: 5   cetirizine (ZYRTEC) 10 MG tablet, Take 10 mg by mouth as needed for allergies. , Disp: , Rfl:    cholecalciferol (VITAMIN D3) 25 MCG (1000 UNIT) tablet, Take 1,000 Units by mouth daily., Disp: , Rfl:    diphenhydrAMINE (BENADRYL) 25 MG tablet, Take 25 mg by mouth at bedtime as needed for allergies or sleep. , Disp: , Rfl:    docusate sodium (COLACE) 100 MG capsule, Take 100 mg by mouth daily., Disp: , Rfl:    EPINEPHrine (AUVI-Q) 0.3 mg/0.3 mL IJ SOAJ injection, Use as directed for severe allergic reaction, Disp: 2 each, Rfl: 2   fluticasone (FLONASE) 50 MCG/ACT nasal spray, Place 2 sprays into both nostrils daily as needed for allergies., Disp: 16 g, Rfl: 3    hydrochlorothiazide (HYDRODIURIL) 25 MG tablet, Take 1 tablet (25 mg total) by mouth daily., Disp: 90 tablet, Rfl: 1   losartan (COZAAR) 25 MG tablet, Take 1 tablet (25 mg total) by mouth daily., Disp: 90 tablet, Rfl: 1   metFORMIN (GLUCOPHAGE) 500 MG tablet, TAKE 1 TABLET(500 MG) BY MOUTH AT BEDTIME, Disp: 90 tablet, Rfl: 1   montelukast (SINGULAIR) 10 MG tablet, TAKE 1 TABLET AT BEDTIME (NEEDS AN OFFICE VISIT FOR FURTHER REFILLS), Disp: 30 tablet, Rfl: 11   Multiple Vitamin (MULTIVITAMIN) tablet, Take 1 tablet by mouth daily., Disp: , Rfl:    polyethylene glycol (MIRALAX / GLYCOLAX) packet, Take 17 g by mouth daily., Disp: , Rfl:   Review of Systems:  Constitutional: Denies fever, chills, diaphoresis, appetite change and fatigue.  HEENT: Denies photophobia, eye pain, redness, hearing loss, ear pain, congestion, sore throat, rhinorrhea, sneezing, mouth sores, trouble swallowing, neck pain, neck stiffness and tinnitus.   Respiratory: Denies SOB, DOE, cough, chest tightness,  and wheezing.   Cardiovascular: Denies chest pain, palpitations and leg swelling.  Gastrointestinal: Denies nausea, vomiting, abdominal pain, diarrhea, constipation, blood in stool and abdominal distention.  Genitourinary: Denies dysuria, urgency, frequency, hematuria, flank pain and difficulty urinating.  Endocrine: Denies: hot or cold intolerance, sweats, changes in hair or nails, polyuria, polydipsia. Musculoskeletal: Denies myalgias, back pain, joint swelling, arthralgias and gait problem.  Skin: Denies pallor, rash and wound.  Neurological: Denies dizziness, seizures, syncope, weakness, light-headedness, numbness and headaches.  Hematological: Denies adenopathy. Easy bruising, personal or family bleeding history  Psychiatric/Behavioral: Denies suicidal ideation, mood changes, confusion, nervousness, sleep disturbance and agitation    Physical Exam: Vitals:   02/17/22 0705  BP: 102/74  Pulse: 65  Temp: 98.2 F  (36.8 C)  TempSrc: Oral  SpO2: 97%  Weight: 189 lb 12.8 oz (86.1 kg)  Height: '5\' 5"'$  (1.651 m)    Body mass index is 31.58 kg/m.   Constitutional: NAD, calm, comfortable Eyes: PERRL, lids and conjunctivae normal, wears corrective lenses ENMT: Mucous membranes are moist. Posterior pharynx clear of any exudate or lesions. Normal dentition. Tympanic membrane is pearly white, no erythema or bulging. Neck: normal, supple, no masses, no thyromegaly Respiratory: clear to auscultation bilaterally, no wheezing, no crackles. Normal respiratory effort. No accessory muscle use.  Cardiovascular: Regular rate and rhythm, no murmurs / rubs / gallops. No extremity edema. 2+ pedal pulses. No carotid bruits.  Abdomen: no tenderness, no masses palpated. No hepatosplenomegaly. Bowel sounds positive.  Musculoskeletal: no clubbing / cyanosis. No joint deformity upper and lower extremities. Good ROM, no contractures. Normal muscle tone.  Skin: no rashes, lesions, ulcers. No induration Neurologic: CN  2-12 grossly intact. Sensation intact, DTR normal. Strength 5/5 in all 4.  Psychiatric: Normal judgment and insight. Alert and oriented x 3. Normal mood.    Impression and Plan:  Encounter for preventive health examination -Recommend routine eye and dental care. -Immunizations: Will get flu and bivalent COVID vaccines soon -Healthy lifestyle discussed in detail. -Labs to be updated today. -Colon cancer screening: 2014, due 2024 -Breast cancer screening: 08/2021 -Cervical cancer screening: 05/2020 -Lung cancer screening: Not applicable -Prostate cancer screening: Not applicable -DEXA: Not applicable  Primary hypertension -Well-controlled on losartan, hydrochlorothiazide.  Type 2 diabetes mellitus with other specified complication, without long-term current use of insulin (HCC)  - Plan: CBC with Differential/Platelet, Comprehensive metabolic panel, Hemoglobin A1c, Lipid panel, TSH, Vitamin B12, VITAMIN D  25 Hydroxy (Vit-D Deficiency, Fractures)  Microalbuminuria due to type 2 diabetes mellitus ([place]) -On losartan.  Asthma, mild intermittent, well-controlled     Dayanne Yiu Isaac Bliss, MD [facility]

## 2022-02-17 NOTE — Telephone Encounter (Signed)
Pt viewed her blood work on [employer] and she said it looks like her a1c has increase and would like a callback

## 2022-02-19 ENCOUNTER — Ambulatory Visit: Payer: 59 | Admitting: Podiatry

## 2022-02-27 ENCOUNTER — Telehealth: Payer: Self-pay

## 2022-02-27 NOTE — Telephone Encounter (Signed)
Patient called and reported spotting. Said she has not had a period in 10 years. Recommended office visit and I schedule her for Weds., 03/05/22 at 4:30pm.

## 2022-03-05 ENCOUNTER — Ambulatory Visit (INDEPENDENT_AMBULATORY_CARE_PROVIDER_SITE_OTHER): Payer: 59 | Admitting: Obstetrics and Gynecology

## 2022-03-05 ENCOUNTER — Other Ambulatory Visit (HOSPITAL_COMMUNITY)
Admission: RE | Admit: 2022-03-05 | Discharge: 2022-03-05 | Disposition: A | Discharge status: Home / Self Care | Payer: 59 | Source: Ambulatory Visit | Attending: Obstetrics and Gynecology | Admitting: Obstetrics and Gynecology

## 2022-03-05 ENCOUNTER — Telehealth: Payer: Self-pay | Admitting: Obstetrics and Gynecology

## 2022-03-05 ENCOUNTER — Encounter: Payer: Self-pay | Admitting: Obstetrics and Gynecology

## 2022-03-05 VITALS — BP 130/68 | HR 57 | Ht 63.5 in | Wt 192.0 lb

## 2022-03-05 DIAGNOSIS — N841 Polyp of cervix uteri: Secondary | ICD-10-CM | POA: Diagnosis present

## 2022-03-05 DIAGNOSIS — N950 Postmenopausal bleeding: Secondary | ICD-10-CM | POA: Insufficient documentation

## 2022-03-05 DIAGNOSIS — Z124 Encounter for screening for malignant neoplasm of cervix: Secondary | ICD-10-CM

## 2022-03-05 NOTE — Patient Instructions (Signed)
Postmenopausal Bleeding Postmenopausal bleeding is any bleeding that a woman has after she has entered menopause. Menopause is the end of a woman's fertile years. After menopause, a woman no longer ovulates and does not have menstrual periods. Therefore, she should no longer have bleeding from her vagina. Postmenopausal bleeding may have various causes, including: Menopausal hormone therapy (MHT). Endometrial atrophy. After menopause, low estrogen hormone levels cause the membrane that lines the uterus (endometrium) to become thin. You may have bleeding as the endometrium thins. Endometrial hyperplasia. This condition is caused by excess estrogen hormones and low levels of progesterone hormones. The excess estrogen causes the endometrium to thicken, which can lead to bleeding. In some cases, this can lead to cancer of the uterus. Endometrial cancer. Noncancerous growths (polyps) on the endometrium, the lining of the uterus, or the cervix. Uterine fibroids. These are noncancerous growths in or around the uterus muscle tissue that can cause heavy bleeding. Any type of postmenopausal bleeding, even if it appears to be a typical menstrual period, should be checked by your health care provider. Treatment will depend on the cause of the bleeding. Follow these instructions at home:  Pay attention to any changes in your symptoms. Let your health care provider know about them. Avoid using tampons and douches as told by your health care provider. Change your pads regularly. Get regular pelvic exams, including Pap tests, as told by your health care provider. Take iron supplements as told by your health care provider. Take over-the-counter and prescription medicines only as told by your health care provider. Keep all follow-up visits. This is important. Contact a health care provider if: You have new bleeding from the vagina after menopause. You have pain in your abdomen. Get help right away if: You have  a fever or chills. You have severe pain with bleeding. You are passing blood clots. You have heavy bleeding, need more than 1 pad an hour, and have never experienced this before. You have headaches or feel faint or dizzy. Summary Postmenopausal bleeding is any bleeding that a woman has after she has entered into menopause. Postmenopausal bleeding may have various causes. Treatment will depend on the cause of the bleeding. Any type of postmenopausal bleeding, even if it appears to be a typical menstrual period, should be checked by your health care provider. Be sure to pay attention to any changes in your symptoms and keep all follow-up visits. This information is not intended to replace advice given to you by your health care provider. Make sure you discuss any questions you have with your health care provider. Document Revised: 12/01/2019 Document Reviewed: 12/01/2019 Elsevier Patient Education  2023 [employer].  

## 2022-03-05 NOTE — Telephone Encounter (Signed)
Please contact patient to schedule pelvic ultrasound and follow up appointment with me.

## 2022-03-05 NOTE — Progress Notes (Signed)
GYNECOLOGY  VISIT   HPI: 61 y.o.   Married  [place] American  female   [phone] with Patient's last menstrual period was 03/30/2012.   here for postmenopausal bleeding x 2 days. This began on 02-25-22.  She has some headache and body cramp with the bleeding.  Took Pamprin.   Not taking any hormonal treatment.   Had PMB in September, 2021.   Pelvic US 03/13/20. Uterus 3 subserosal fibroids - 1.53 mm, 0.44 mm, 0.76 mm. EMS 1.93 mm. Ovaries normal.  No adnexal masses.  No free fluid.   No EMB done at that time.   GYNECOLOGIC HISTORY: Patient's last menstrual period was 03/30/2012. Contraception:  tubal/PMP Menopausal hormone therapy:  none Last mammogram:  09-13-21 Diag.Lt.Br. w/US--SEE EPIC Last pap smear:   06-11-20 Neg:Neg HR HPV, 04-16-17 Neg:Neg HR HPV, 12-12-13 Neg:Neg HR HPV        OB History     Gravida  2   Para  2   Term  2   Preterm      AB      Living  2      SAB      IAB      Ectopic      Multiple      Live Births  1              Patient Active Problem List   Diagnosis Date Noted   OSA (obstructive sleep apnea) 10/22/2021   Microalbuminuria due to type 2 diabetes mellitus ([place]) 08/12/2021   HTN (hypertension) 11/04/2019   DM (diabetes mellitus), type 2 ([place]) 11/04/2019   Mild persistent asthma with acute exacerbation 05/31/2018   Adverse food reaction 05/31/2018   Restrictive airway disease 11/12/2017   Anaphylactic shock due to adverse food reaction 11/12/2017   Chronic seasonal allergic rhinitis due to fungal spores 11/12/2017   Class 1 obesity without serious comorbidity with body mass index (BMI) of 31.0 to 31.9 in adult 10/21/2016   Chronic constipation 10/21/2016   Seasonal and perennial allergic rhinitis 10/05/2007   Asthma, mild intermittent, well-controlled 10/05/2007    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   Asthma    Fibroids,  subserous    GERD (gastroesophageal reflux disease)    Headache(784.0)    tension- not an issue   Hypertension    IGT (impaired glucose tolerance) 11/04/2019   Torn meniscus 04/2020   right   Urticaria     Past Surgical History:  Procedure Laterality Date   BALLOON DILATION N/A 11/19/2015   Procedure: BALLOON DILATION;  Surgeon: Garlan Fair, MD;  Location: [facility];  Service: Endoscopy;  Laterality: N/A;   BUNIONECTOMY Bilateral    COLONOSCOPY WITH PROPOFOL N/A 10/05/2012   Procedure: COLONOSCOPY WITH PROPOFOL;  Surgeon: Garlan Fair, MD;  Location: [facility];  Service: Endoscopy;  Laterality: N/A;   ESOPHAGOGASTRODUODENOSCOPY (EGD) WITH PROPOFOL N/A 11/19/2015   Procedure: ESOPHAGOGASTRODUODENOSCOPY (EGD) WITH PROPOFOL;  Surgeon: Garlan Fair, MD;  Location: [facility];  Service: Endoscopy;  Laterality: N/A;   TUBAL LIGATION  27 years ago    Current Outpatient Medications  Medication Sig Dispense Refill   ADVAIR DISKUS 250-50 MCG/ACT AEPB INHALE 1 PUFF INTO THE LUNGS IN THE MORNING AND AT BEDTIME 60 each 3   albuterol (PROVENTIL HFA) 108 (90 Base) MCG/ACT inhaler Inhale two puffs every 4-6 hours if needed for  cough or wheeze 18 g 1   atorvastatin (LIPITOR) 20 MG tablet TAKE 1 TABLET(20 MG) BY MOUTH DAILY 90 tablet 1   azelastine (ASTELIN) 0.1 % nasal spray PLACE 2 SPRAYS INTO BOTH NOSTRILS TWICE DAILY 30 mL 5   cetirizine (ZYRTEC) 10 MG tablet Take 10 mg by mouth as needed for allergies.      cholecalciferol (VITAMIN D3) 25 MCG (1000 UNIT) tablet Take 1,000 Units by mouth daily.     diphenhydrAMINE (BENADRYL) 25 MG tablet Take 25 mg by mouth at bedtime as needed for allergies or sleep.      docusate sodium (COLACE) 100 MG capsule Take 100 mg by mouth daily.     EPINEPHrine (AUVI-Q) 0.3 mg/0.3 mL IJ SOAJ injection Use as directed for severe allergic reaction 2 each 2   fluticasone (FLONASE) 50 MCG/ACT nasal spray Place 2 sprays into both nostrils daily as needed for  allergies. 16 g 3   hydrochlorothiazide (HYDRODIURIL) 25 MG tablet Take 1 tablet (25 mg total) by mouth daily. 90 tablet 1   losartan (COZAAR) 25 MG tablet Take 1 tablet (25 mg total) by mouth daily. 90 tablet 1   metFORMIN (GLUCOPHAGE) 500 MG tablet TAKE 1 TABLET(500 MG) BY MOUTH AT BEDTIME 90 tablet 1   montelukast (SINGULAIR) 10 MG tablet TAKE 1 TABLET AT BEDTIME (NEEDS AN OFFICE VISIT FOR FURTHER REFILLS) 30 tablet 11   Multiple Vitamin (MULTIVITAMIN) tablet Take 1 tablet by mouth daily.     polyethylene glycol (MIRALAX / GLYCOLAX) packet Take 17 g by mouth daily.     triamcinolone cream (KENALOG) 0.1 % APPLY SMALL AMOUNT TOPICALLY TO THE AFFECTED AREA TWICE DAILY AS NEEDED     valsartan-hydrochlorothiazide (DIOVAN-HCT) 80-12.5 MG tablet      No current facility-administered medications for this visit.     ALLERGIES: Shellfish allergy, Sulfonamide derivatives, Meloxicam, and Nasonex [mometasone furoate]  Family History  Problem Relation Age of Onset   Emphysema Father    Lung cancer Father    High Cholesterol Mother    Osteoporosis Mother    Hypertension Mother    Migraines Daughter    Hypertension Son        exercise   Asthma Son    Hypertension Sister    COPD Other    Breast cancer Neg Hx    Allergic rhinitis Neg Hx    Angioedema Neg Hx    Atopy Neg Hx    Eczema Neg Hx    Immunodeficiency Neg Hx    Urticaria Neg Hx     Social History   Socioeconomic History   Marital status: Married    Spouse name: Not on file   Number of children: 2   Years of education: Not on file   Highest education level: Not on file  Occupational History   Occupation: Biochemist, clinical,    Employer: [employer]  Tobacco Use   Smoking status: Never   Smokeless tobacco: Never  Vaping Use   Vaping Use: Never used  Substance and Sexual Activity   Alcohol use: No   Drug use: No   Sexual activity: Yes    Partners: Male    Birth control/protection: Surgical, Post-menopausal     Comment: Tubal Ligation  Other Topics Concern   Not on file  Social History Narrative   Work or School: Economist - full time, like job      Home Situation: lives with husband      Spiritual Beliefs: Christian - baptist  Lifestyle: no regular exercise, diet "bad"   Social Determinants of Health   Financial Resource Strain: Not on file  Food Insecurity: Not on file  Transportation Needs: Not on file  Physical Activity: Not on file  Stress: Not on file  Social Connections: Not on file  Intimate Partner Violence: Not on file    Review of Systems  Genitourinary:        Postmenopausal bleeding x2 days  All other systems reviewed and are negative.   PHYSICAL EXAMINATION:    BP 130/68   Pulse (!) 57   Ht 5' 3.5" (1.613 m)   Wt 192 lb (87.1 kg)   LMP 03/30/2012 Comment: Dr Cathie Olden - GYN  SpO2 98%   BMI 33.48 kg/m     General appearance: alert, cooperative and appears stated age   Pelvic: External genitalia:  no lesions              Urethra:  normal appearing urethra with no masses, tenderness or lesions              Bartholins and Skenes: normal                 Vagina: normal appearing vagina with normal color and discharge, no lesions              Cervix: endocervical polyp noted.               Pap and HR HPV collected.                 Bimanual Exam:  Uterus:  normal size, contour, position, consistency, mobility, non-tender              Adnexa: no mass, fullness, tenderness               EMB Consent done.  Sterile prep with Hibiclens.  Paracervical block with 10 cc 1% lidocaine, lot [id number], exp 07/01/23 Dressing forceps used to remove polyp, which was sent to pathology.  Tenaculum to anterior cervical lip.  Pipelle passed to 8 cm x 2.   Scant tissue obtained and sent to pathology.  No complications.  Minimal EBL.  Chaperone was present for exam:  Ayn, CMA  ASSESSMENT  Postmenopausal bleeding.  Cervical cancer screening.  Endocervical  polyp.  PLAN  We discussed etiologies of postmenopausal bleeding:  atrophy, polyps, infection, precancer and cancer.  Pap and HR HPV collected.  Fu biopsy of the endocervical polyp and endometrium.  Return for pelvic US and follow up appointment.   20 min  total time was spent for this patient encounter, including preparation, face-to-face counseling with the patient, coordination of care, and documentation of the encounter.  An After Visit Summary was printed and given to the patient.

## 2022-03-07 LAB — SURGICAL PATHOLOGY

## 2022-03-10 ENCOUNTER — Ambulatory Visit: Payer: 59 | Admitting: Podiatry

## 2022-03-12 NOTE — Telephone Encounter (Signed)
Encounter reviewed and closed.  

## 2022-03-13 ENCOUNTER — Ambulatory Visit: Payer: 59 | Admitting: Family Medicine

## 2022-03-14 ENCOUNTER — Other Ambulatory Visit: Payer: Self-pay | Admitting: Internal Medicine

## 2022-03-14 DIAGNOSIS — E1129 Type 2 diabetes mellitus with other diabetic kidney complication: Secondary | ICD-10-CM

## 2022-03-18 LAB — CYTOLOGY - PAP
Comment: NEGATIVE
Diagnosis: NEGATIVE
Diagnosis: REACTIVE
High risk HPV: NEGATIVE

## 2022-03-21 ENCOUNTER — Other Ambulatory Visit: Payer: 59

## 2022-03-27 ENCOUNTER — Ambulatory Visit
Admission: RE | Admit: 2022-03-27 | Discharge: 2022-03-27 | Disposition: A | Discharge status: Home / Self Care | Payer: 59 | Source: Ambulatory Visit | Attending: Internal Medicine | Admitting: Internal Medicine

## 2022-03-27 ENCOUNTER — Other Ambulatory Visit: Payer: Self-pay | Admitting: Internal Medicine

## 2022-03-27 DIAGNOSIS — N6320 Unspecified lump in the left breast, unspecified quadrant: Secondary | ICD-10-CM

## 2022-04-02 NOTE — Progress Notes (Unsigned)
GYNECOLOGY  VISIT   HPI: 61 y.o.   Married  {Race/ethnicity:[id number]}  female   [id number] with Patient's last menstrual period was 03/30/2012.   here for  pelvic ultrasound.  GYNECOLOGIC HISTORY: Patient's last menstrual period was 03/30/2012. Contraception:  BTL/post menopausal Menopausal hormone therapy:  none Last mammogram:  03-27-22 left breast f/u u/s birads 2: probably benign, 09-13-21 diagnostic left breast with u/s-see epic, 08-19-21 bilateral Last pap smear:   03-05-22 neg HPV HR neg, 06-29-20 neg HPV HR neg, 04-16-17 neg HPV HR neg        OB History     Gravida  2   Para  2   Term  2   Preterm      AB      Living  2      SAB      IAB      Ectopic      Multiple      Live Births  1              Patient Active Problem List   Diagnosis Date Noted   OSA (obstructive sleep apnea) 10/22/2021   Microalbuminuria due to type 2 diabetes mellitus ([place]) 08/12/2021   HTN (hypertension) 11/04/2019   DM (diabetes mellitus), type 2 ([place]) 11/04/2019   Mild persistent asthma with acute exacerbation 05/31/2018   Adverse food reaction 05/31/2018   Restrictive airway disease 11/12/2017   Anaphylactic shock due to adverse food reaction 11/12/2017   Chronic seasonal allergic rhinitis due to fungal spores 11/12/2017   Class 1 obesity without serious comorbidity with body mass index (BMI) of 31.0 to 31.9 in adult 10/21/2016   Chronic constipation 10/21/2016   Seasonal and perennial allergic rhinitis 10/05/2007   Asthma, mild intermittent, well-controlled 10/05/2007    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   Asthma    Fibroids, subserous    GERD (gastroesophageal reflux disease)    Headache(784.0)    tension- not an issue   Hypertension    IGT (impaired glucose tolerance) 11/04/2019   Torn meniscus 04/2020   right   Urticaria     Past Surgical History:  Procedure Laterality Date    BALLOON DILATION N/A 11/19/2015   Procedure: BALLOON DILATION;  Surgeon: Garlan Fair, MD;  Location: [facility];  Service: Endoscopy;  Laterality: N/A;   BUNIONECTOMY Bilateral    COLONOSCOPY WITH PROPOFOL N/A 10/05/2012   Procedure: COLONOSCOPY WITH PROPOFOL;  Surgeon: Garlan Fair, MD;  Location: [facility];  Service: Endoscopy;  Laterality: N/A;   ESOPHAGOGASTRODUODENOSCOPY (EGD) WITH PROPOFOL N/A 11/19/2015   Procedure: ESOPHAGOGASTRODUODENOSCOPY (EGD) WITH PROPOFOL;  Surgeon: Garlan Fair, MD;  Location: [facility];  Service: Endoscopy;  Laterality: N/A;   TUBAL LIGATION  27 years ago    Current Outpatient Medications  Medication Sig Dispense Refill   ADVAIR DISKUS 250-50 MCG/ACT AEPB INHALE 1 PUFF INTO THE LUNGS IN THE MORNING AND AT BEDTIME 60 each 3   albuterol (PROVENTIL HFA) 108 (90 Base) MCG/ACT inhaler Inhale two puffs every 4-6 hours if needed for cough or wheeze 18 g 1   atorvastatin (LIPITOR) 20 MG tablet TAKE 1 TABLET(20 MG) BY MOUTH DAILY 90 tablet 1   azelastine (ASTELIN) 0.1 % nasal spray PLACE 2 SPRAYS INTO BOTH NOSTRILS TWICE DAILY 30 mL 5   cetirizine (ZYRTEC) 10 MG tablet Take 10 mg by mouth as  needed for allergies.      cholecalciferol (VITAMIN D3) 25 MCG (1000 UNIT) tablet Take 1,000 Units by mouth daily.     diphenhydrAMINE (BENADRYL) 25 MG tablet Take 25 mg by mouth at bedtime as needed for allergies or sleep.      docusate sodium (COLACE) 100 MG capsule Take 100 mg by mouth daily.     EPINEPHrine (AUVI-Q) 0.3 mg/0.3 mL IJ SOAJ injection Use as directed for severe allergic reaction 2 each 2   fluticasone (FLONASE) 50 MCG/ACT nasal spray Place 2 sprays into both nostrils daily as needed for allergies. 16 g 3   hydrochlorothiazide (HYDRODIURIL) 25 MG tablet Take 1 tablet (25 mg total) by mouth daily. 90 tablet 1   losartan (COZAAR) 25 MG tablet TAKE 1 TABLET(25 MG) BY MOUTH DAILY 90 tablet 1   metFORMIN (GLUCOPHAGE) 500 MG tablet TAKE 1 TABLET(500 MG)  BY MOUTH AT BEDTIME 90 tablet 1   montelukast (SINGULAIR) 10 MG tablet TAKE 1 TABLET AT BEDTIME (NEEDS AN OFFICE VISIT FOR FURTHER REFILLS) 30 tablet 11   Multiple Vitamin (MULTIVITAMIN) tablet Take 1 tablet by mouth daily.     polyethylene glycol (MIRALAX / GLYCOLAX) packet Take 17 g by mouth daily.     triamcinolone cream (KENALOG) 0.1 % APPLY SMALL AMOUNT TOPICALLY TO THE AFFECTED AREA TWICE DAILY AS NEEDED     valsartan-hydrochlorothiazide (DIOVAN-HCT) 80-12.5 MG tablet      No current facility-administered medications for this visit.     ALLERGIES: Shellfish allergy, Sulfonamide derivatives, Meloxicam, and Nasonex [mometasone furoate]  Family History  Problem Relation Age of Onset   Emphysema Father    Lung cancer Father    High Cholesterol Mother    Osteoporosis Mother    Hypertension Mother    Migraines Daughter    Hypertension Son        exercise   Asthma Son    Hypertension Sister    COPD Other    Breast cancer Neg Hx    Allergic rhinitis Neg Hx    Angioedema Neg Hx    Atopy Neg Hx    Eczema Neg Hx    Immunodeficiency Neg Hx    Urticaria Neg Hx     Social History   Socioeconomic History   Marital status: Married    Spouse name: Not on file   Number of children: 2   Years of education: Not on file   Highest education level: Not on file  Occupational History   Occupation: Biochemist, clinical,    Employer: [employer]  Tobacco Use   Smoking status: Never   Smokeless tobacco: Never  Vaping Use   Vaping Use: Never used  Substance and Sexual Activity   Alcohol use: No   Drug use: No   Sexual activity: Yes    Partners: Male    Birth control/protection: Surgical, Post-menopausal    Comment: Tubal Ligation  Other Topics Concern   Not on file  Social History Narrative   Work or School: Economist - full time, like job      Home Situation: lives with husband      Spiritual Beliefs: Christian - baptist      Lifestyle: no regular exercise, diet  "bad"   Social Determinants of Radio broadcast assistant Strain: Not on file  Food Insecurity: Not on file  Transportation Needs: Not on file  Physical Activity: Not on file  Stress: Not on file  Social Connections: Not on file  Intimate Partner Violence:  Not on file    Review of Systems  PHYSICAL EXAMINATION:    LMP 03/30/2012 Comment: Dr Cathie Olden - GYN    General appearance: alert, cooperative and appears stated age Head: Normocephalic, without obvious abnormality, atraumatic Neck: no adenopathy, supple, symmetrical, trachea midline and thyroid normal to inspection and palpation Lungs: clear to auscultation bilaterally Breasts: normal appearance, no masses or tenderness, No nipple retraction or dimpling, No nipple discharge or bleeding, No axillary or supraclavicular adenopathy Heart: regular rate and rhythm Abdomen: soft, non-tender, no masses,  no organomegaly Extremities: extremities normal, atraumatic, no cyanosis or edema Skin: Skin color, texture, turgor normal. No rashes or lesions Lymph nodes: Cervical, supraclavicular, and axillary nodes normal. No abnormal inguinal nodes palpated Neurologic: Grossly normal  Pelvic: External genitalia:  no lesions              Urethra:  normal appearing urethra with no masses, tenderness or lesions              Bartholins and Skenes: normal                 Vagina: normal appearing vagina with normal color and discharge, no lesions              Cervix: no lesions                Bimanual Exam:  Uterus:  normal size, contour, position, consistency, mobility, non-tender              Adnexa: no mass, fullness, tenderness              Rectal exam: {yes no:[id number]}.  Confirms.              Anus:  normal sphincter tone, no lesions  Chaperone was present for exam:  ***  ASSESSMENT     PLAN     An After Visit Summary was printed and given to the patient.  ______ minutes face to face time of which over 50% was spent in counseling.

## 2022-04-03 ENCOUNTER — Ambulatory Visit (INDEPENDENT_AMBULATORY_CARE_PROVIDER_SITE_OTHER): Payer: 59

## 2022-04-03 ENCOUNTER — Encounter: Payer: Self-pay | Admitting: Obstetrics and Gynecology

## 2022-04-03 ENCOUNTER — Ambulatory Visit (INDEPENDENT_AMBULATORY_CARE_PROVIDER_SITE_OTHER): Payer: 59 | Admitting: Obstetrics and Gynecology

## 2022-04-03 VITALS — BP 110/74 | HR 55 | Wt 190.0 lb

## 2022-04-03 DIAGNOSIS — D219 Benign neoplasm of connective and other soft tissue, unspecified: Secondary | ICD-10-CM

## 2022-04-03 DIAGNOSIS — N950 Postmenopausal bleeding: Secondary | ICD-10-CM

## 2022-04-04 ENCOUNTER — Other Ambulatory Visit: Payer: Self-pay | Admitting: Internal Medicine

## 2022-04-04 DIAGNOSIS — I10 Essential (primary) hypertension: Secondary | ICD-10-CM

## 2022-04-24 ENCOUNTER — Ambulatory Visit (INDEPENDENT_AMBULATORY_CARE_PROVIDER_SITE_OTHER): Payer: 59 | Admitting: Adult Health

## 2022-04-24 ENCOUNTER — Encounter: Payer: Self-pay | Admitting: Adult Health

## 2022-04-24 ENCOUNTER — Ambulatory Visit: Payer: 59 | Admitting: Adult Health

## 2022-04-24 VITALS — BP 114/80 | HR 69 | Temp 98.5°F | Ht 65.0 in | Wt 193.2 lb

## 2022-04-24 DIAGNOSIS — G4733 Obstructive sleep apnea (adult) (pediatric): Secondary | ICD-10-CM

## 2022-04-24 DIAGNOSIS — J302 Other seasonal allergic rhinitis: Secondary | ICD-10-CM

## 2022-04-24 DIAGNOSIS — J45909 Unspecified asthma, uncomplicated: Secondary | ICD-10-CM | POA: Insufficient documentation

## 2022-04-24 DIAGNOSIS — J3089 Other allergic rhinitis: Secondary | ICD-10-CM | POA: Diagnosis not present

## 2022-04-24 DIAGNOSIS — T7800XD Anaphylactic reaction due to unspecified food, subsequent encounter: Secondary | ICD-10-CM

## 2022-04-24 DIAGNOSIS — J4530 Mild persistent asthma, uncomplicated: Secondary | ICD-10-CM

## 2022-04-24 DIAGNOSIS — Z23 Encounter for immunization: Secondary | ICD-10-CM

## 2022-04-24 MED ORDER — EPINEPHRINE 0.3 MG/0.3ML IJ SOAJ: INTRAMUSCULAR | 2 refills | Status: DC

## 2022-04-24 MED ORDER — FLUTICASONE PROPIONATE 50 MCG/ACT NA SUSP: 2.0000 | Freq: Every day | NASAL | 11 refills | Status: DC | PRN

## 2022-04-24 NOTE — Assessment & Plan Note (Deleted)
EpiPen refilled.  Patient education given

## 2022-04-24 NOTE — Assessment & Plan Note (Signed)
Mild to moderate persistent asthma under good control.  Continue on current regimen.  Flu shot today.  Plan  Patient Instructions  Continue on Advair 1 puff Twice daily  , rinse after use.  Continue on Singulair  Saline nasal rinses Twice daily   Continue on Flonase and Astelin As needed   Zyrtec '10mg'$  daily As needed   Albuterol inhaler As needed   Flu shot today  Follow up with Dr. Elsworth Soho  in 6 months and As needed

## 2022-04-24 NOTE — Addendum Note (Signed)
Addended by: Vanessa Barbara on: 04/24/2022 04:15 PM   Modules accepted: Orders

## 2022-04-24 NOTE — Assessment & Plan Note (Signed)
Very mild continue with positional sleep and weight loss

## 2022-04-24 NOTE — Assessment & Plan Note (Signed)
Slight flare of symptoms.  Restart Zyrtec daily for at least 1 week and then as needed.  Continue with Flonase and Astelin nasal spray

## 2022-04-24 NOTE — Patient Instructions (Signed)
Continue on Advair 1 puff Twice daily  , rinse after use.  Continue on Singulair  Saline nasal rinses Twice daily   Continue on Flonase and Astelin As needed   Zyrtec '10mg'$  daily As needed   Albuterol inhaler As needed   Flu shot today  Follow up with Dr. Elsworth Soho  in 6 months and As needed

## 2022-04-24 NOTE — Progress Notes (Signed)
$'@Patient'B$  ID: Victoria [name], female    DOB: June 23, 1961, 61 y.o.   MRN: [id number]  Chief Complaint  Patient presents with   Follow-up    Referring provider: Isaac Bliss, [facility]*  HPI: 61 year old female followed for chronic obstructive asthma and mild OSA    TEST/EVENTS :  Positive allergy testing in 2008 for common environmental allergens.  Seasonal and perennial allergic rhinitis (grasses, weeds, trees, molds, dust mite, cat, and cockroach   Spirometry 08/2020-ratio 57, FEV1 1.21/56%, FVC 2.14/78%   08/2019-ratio 64, FEV1 1.10/53%, FVC 66%   2020-ratio 62, FEV1 1.11/50%, FVC 1.79/64%   2019-ratio 65, FEV1 59%, FVC 73%    spirometry 07/27/2009  FEV1 1.34/53%, FVC 2.12/68.8%, FEV1/FEC 0.63. Moderate obstructive airways disease without response to bronchodilator  04/24/2022 Follow up : Asthma  Patient presents for 60-monthfollow-up.  She says overall asthma is doing better.  She remains on Advair twice daily.  She is on Singulair, Astelin and Flonase. No increased albuterol inhaler.  Shell fish allergy, needs epi pen refill.  Works full time. No formal exercise .  No increased cough or wheezing .  Does has nasal congestion and sinus pressure . Not taking zyrtec . No discolored mucus or fever.        Allergies  Allergen Reactions   Shellfish Allergy Shortness Of Breath and Itching   Sulfonamide Derivatives Itching, Swelling and Other (See Comments)    Throat swells   Meloxicam Swelling   Nasonex [Mometasone Furoate] Other (See Comments)    Nose bleeds    Immunization History  Administered Date(s) Administered   Influenza Split 03/30/2013, 03/30/2014, 05/16/2015   Influenza Whole 03/30/2009   Influenza,inj,Quad PF,6+ Mos 03/09/2019   Influenza-Unspecified 02/28/2018, 05/19/2020, 04/23/2021   Moderna Sars-Covid-2 Vaccination 09/14/2019, 10/12/2019, 05/27/2020, 01/06/2021   Td 10/21/2016   Tdap 06/30/2009   Zoster Recombinat (Shingrix) 02/07/2020,  08/09/2020    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   Asthma    Fibroids, subserous    GERD (gastroesophageal reflux disease)    Headache(784.0)    tension- not an issue   Hypertension    IGT (impaired glucose tolerance) 11/04/2019   Torn meniscus 04/2020   right   Urticaria     Tobacco History: Social History   Tobacco Use  Smoking Status Never  Smokeless Tobacco Never   Counseling given: Not Answered   Outpatient Medications Prior to Visit  Medication Sig Dispense Refill   ADVAIR DISKUS 250-50 MCG/ACT AEPB INHALE 1 PUFF INTO THE LUNGS IN THE MORNING AND AT BEDTIME 60 each 3   albuterol (PROVENTIL HFA) 108 (90 Base) MCG/ACT inhaler Inhale two puffs every 4-6 hours if needed for cough or wheeze 18 g 1   atorvastatin (LIPITOR) 20 MG tablet TAKE 1 TABLET(20 MG) BY MOUTH DAILY 90 tablet 1   azelastine (ASTELIN) 0.1 % nasal spray PLACE 2 SPRAYS INTO BOTH NOSTRILS TWICE DAILY 30 mL 5   cetirizine (ZYRTEC) 10 MG tablet Take 10 mg by mouth as needed for allergies.      cholecalciferol (VITAMIN D3) 25 MCG (1000 UNIT) tablet Take 1,000 Units by mouth daily.     Cyanocobalamin (B-12 PO) Take by mouth.     diphenhydrAMINE (BENADRYL) 25 MG tablet Take 25 mg by mouth at bedtime as needed for allergies or sleep.      docusate sodium (COLACE) 100 MG capsule Take 100  mg by mouth daily.     EPINEPHrine (AUVI-Q) 0.3 mg/0.3 mL IJ SOAJ injection Use as directed for severe allergic reaction 2 each 2   hydrochlorothiazide (HYDRODIURIL) 25 MG tablet TAKE 1 TABLET DAILY (LAST REFILL, NEED APPOINTMENT) 90 tablet 1   losartan (COZAAR) 25 MG tablet TAKE 1 TABLET(25 MG) BY MOUTH DAILY 90 tablet 1   metFORMIN (GLUCOPHAGE) 500 MG tablet TAKE 1 TABLET(500 MG) BY MOUTH AT BEDTIME 90 tablet 1   montelukast (SINGULAIR) 10 MG tablet TAKE 1 TABLET AT BEDTIME (NEEDS AN OFFICE VISIT FOR FURTHER REFILLS) 30 tablet 11    Multiple Vitamin (MULTIVITAMIN) tablet Take 1 tablet by mouth daily.     polyethylene glycol (MIRALAX / GLYCOLAX) packet Take 17 g by mouth daily.     fluticasone (FLONASE) 50 MCG/ACT nasal spray Place 2 sprays into both nostrils daily as needed for allergies. (Patient not taking: Reported on 04/24/2022) 16 g 3   No facility-administered medications prior to visit.     Review of Systems:   Constitutional:   No  weight loss, night sweats,  Fevers, chills, fatigue, or  lassitude.  HEENT:   No headaches,  Difficulty swallowing,  Tooth/dental problems, or  Sore throat,                No sneezing, itching, ear ache,  +nasal congestion, post nasal drip,   CV:  No chest pain,  Orthopnea, PND, swelling in lower extremities, anasarca, dizziness, palpitations, syncope.   GI  No heartburn, indigestion, abdominal pain, nausea, vomiting, diarrhea, change in bowel habits, loss of appetite, bloody stools.   Resp: .  No chest wall deformity  Skin: no rash or lesions.  GU: no dysuria, change in color of urine, no urgency or frequency.  No flank pain, no hematuria   MS:  No joint pain or swelling.  No decreased range of motion.  No back pain.    Physical Exam  BP 114/80 (BP Location: Left Arm, Patient Position: Sitting, Cuff Size: Normal)   Pulse 69   Temp 98.5 F (36.9 C) (Oral)   Ht '5\' 5"'$  (1.651 m)   Wt 193 lb 3.2 oz (87.6 kg)   LMP 03/30/2012 Comment: BTL  SpO2 97%   BMI 32.15 kg/m   GEN: A/Ox3; pleasant , NAD, well nourished    HEENT:  [place]/AT,  EACs-clear, TMs-wnl, NOSE-clear, THROAT-clear, no lesions, no postnasal drip or exudate noted.   NECK:  Supple w/ fair ROM; no JVD; normal carotid impulses w/o bruits; no thyromegaly or nodules palpated; no lymphadenopathy.    RESP  Clear  P & A; w/o, wheezes/ rales/ or rhonchi. no accessory muscle use, no dullness to percussion  CARD:  RRR, no m/r/g, no peripheral edema, pulses intact, no cyanosis or clubbing.  GI:   Soft & nt; nml bowel  sounds; no organomegaly or masses detected.   Musco: Warm bil, no deformities or joint swelling noted.   Neuro: alert, no focal deficits noted.    Skin: Warm, no lesions or rashes    Lab Results:  CBC  BNP No results found for: "BNP"  ProBNP No results found for: "PROBNP"  Imaging:         No data to display          No results found for: "NITRICOXIDE"      Assessment & Plan:   Asthma Mild to moderate persistent asthma under good control.  Continue on current regimen.  Flu shot today.  Plan  Patient  Instructions  Continue on Advair 1 puff Twice daily  , rinse after use.  Continue on Singulair  Saline nasal rinses Twice daily   Continue on Flonase and Astelin As needed   Zyrtec '10mg'$  daily As needed   Albuterol inhaler As needed   Flu shot today  Follow up with Dr. Elsworth Soho  in 6 months and As needed       OSA (obstructive sleep apnea) Very mild continue with positional sleep and weight loss  Seasonal and perennial allergic rhinitis Slight flare of symptoms.  Restart Zyrtec daily for at least 1 week and then as needed.  Continue with Flonase and Astelin nasal spray     Rexene Edison, NP 04/24/2022

## 2022-05-13 ENCOUNTER — Other Ambulatory Visit: Payer: Self-pay | Admitting: Primary Care

## 2022-05-20 ENCOUNTER — Encounter: Payer: Self-pay | Admitting: Internal Medicine

## 2022-05-20 ENCOUNTER — Ambulatory Visit (INDEPENDENT_AMBULATORY_CARE_PROVIDER_SITE_OTHER): Payer: 59 | Admitting: Internal Medicine

## 2022-05-20 VITALS — BP 120/84 | HR 80 | Temp 98.5°F | Wt 197.0 lb

## 2022-05-20 DIAGNOSIS — J4530 Mild persistent asthma, uncomplicated: Secondary | ICD-10-CM | POA: Diagnosis not present

## 2022-05-20 DIAGNOSIS — R809 Proteinuria, unspecified: Secondary | ICD-10-CM

## 2022-05-20 DIAGNOSIS — E1169 Type 2 diabetes mellitus with other specified complication: Secondary | ICD-10-CM | POA: Diagnosis not present

## 2022-05-20 DIAGNOSIS — I10 Essential (primary) hypertension: Secondary | ICD-10-CM | POA: Diagnosis not present

## 2022-05-20 DIAGNOSIS — E1129 Type 2 diabetes mellitus with other diabetic kidney complication: Secondary | ICD-10-CM | POA: Diagnosis not present

## 2022-05-20 LAB — POCT GLYCOSYLATED HEMOGLOBIN (HGB A1C): Hemoglobin A1C: 6 % — AB (ref 4.0–5.6)

## 2022-05-20 NOTE — Progress Notes (Signed)
Established Patient Office Visit     CC/Reason for Visit: Follow-up chronic conditions  HPI: Victoria [name] is a 61 y.o. female who is coming in today for the above mentioned reasons. Past Medical History is significant for: Hypertension, hyperlipidemia, type 2 diabetes, well-controlled asthma.  She is feeling well today.   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   Asthma    Fibroids, subserous    GERD (gastroesophageal reflux disease)    Headache(784.0)    tension- not an issue   Hypertension    IGT (impaired glucose tolerance) 11/04/2019   Torn meniscus 04/2020   right   Urticaria     Past Surgical History:  Procedure Laterality Date   BALLOON DILATION N/A 11/19/2015   Procedure: BALLOON DILATION;  Surgeon: Garlan Fair, MD;  Location: [facility];  Service: Endoscopy;  Laterality: N/A;   BUNIONECTOMY Bilateral    COLONOSCOPY WITH PROPOFOL N/A 10/05/2012   Procedure: COLONOSCOPY WITH PROPOFOL;  Surgeon: Garlan Fair, MD;  Location: [facility];  Service: Endoscopy;  Laterality: N/A;   ESOPHAGOGASTRODUODENOSCOPY (EGD) WITH PROPOFOL N/A 11/19/2015   Procedure: ESOPHAGOGASTRODUODENOSCOPY (EGD) WITH PROPOFOL;  Surgeon: Garlan Fair, MD;  Location: [facility];  Service: Endoscopy;  Laterality: N/A;   TUBAL LIGATION  27 years ago    Social History:  reports that she has never smoked. She has never used smokeless tobacco. She reports that she does not drink alcohol and does not use drugs.  Allergies: Allergies  Allergen Reactions   Shellfish Allergy Shortness Of Breath and Itching   Sulfonamide Derivatives Itching, Swelling and Other (See Comments)    Throat swells   Meloxicam Swelling   Nasonex [Mometasone Furoate] Other (See Comments)    Nose bleeds    Family History:  Family History  Problem Relation Age of Onset   High  Cholesterol Mother    Osteoporosis Mother    Hypertension Mother    Emphysema Father    Lung cancer Father    Hypertension Sister    Migraines Daughter    Hypertension Son        exercise   Asthma Son    COPD Other    Atopy Neg Hx    Eczema Neg Hx    Immunodeficiency Neg Hx    Urticaria Neg Hx      Current Outpatient Medications:    ADVAIR DISKUS 250-50 MCG/ACT AEPB, INHALE 1 PUFF INTO THE LUNGS IN THE MORNING AND AT BEDTIME, Disp: 60 each, Rfl: 3   albuterol (PROVENTIL HFA) 108 (90 Base) MCG/ACT inhaler, Inhale two puffs every 4-6 hours if needed for cough or wheeze, Disp: 18 g, Rfl: 1   atorvastatin (LIPITOR) 20 MG tablet, TAKE 1 TABLET(20 MG) BY MOUTH DAILY, Disp: 90 tablet, Rfl: 1   azelastine (ASTELIN) 0.1 % nasal spray, PLACE 2 SPRAYS INTO BOTH NOSTRILS TWICE DAILY, Disp: 30 mL, Rfl: 5   cetirizine (ZYRTEC) 10 MG tablet, Take 10 mg by mouth as needed for allergies. , Disp: , Rfl:    cholecalciferol (VITAMIN D3) 25 MCG (1000 UNIT) tablet, Take 1,000 Units by mouth daily., Disp: , Rfl:    Cyanocobalamin (B-12 PO), Take by mouth., Disp: , Rfl:    diphenhydrAMINE (BENADRYL) 25 MG tablet, Take 25 mg by mouth at bedtime as needed for allergies or sleep. , Disp: , Rfl:  docusate sodium (COLACE) 100 MG capsule, Take 100 mg by mouth daily., Disp: , Rfl:    EPINEPHrine (AUVI-Q) 0.3 mg/0.3 mL IJ SOAJ injection, Use as directed for severe allergic reaction, Disp: 2 each, Rfl: 2   fluticasone (FLONASE) 50 MCG/ACT nasal spray, Place 2 sprays into both nostrils daily as needed for allergies., Disp: 16 g, Rfl: 11   hydrochlorothiazide (HYDRODIURIL) 25 MG tablet, TAKE 1 TABLET DAILY (LAST REFILL, NEED APPOINTMENT), Disp: 90 tablet, Rfl: 1   losartan (COZAAR) 25 MG tablet, TAKE 1 TABLET(25 MG) BY MOUTH DAILY, Disp: 90 tablet, Rfl: 1   metFORMIN (GLUCOPHAGE) 500 MG tablet, TAKE 1 TABLET(500 MG) BY MOUTH AT BEDTIME, Disp: 90 tablet, Rfl: 1   montelukast (SINGULAIR) 10 MG tablet, TAKE 1 TABLET AT  BEDTIME (NEEDS AN OFFICE VISIT FOR FURTHER REFILLS), Disp: 30 tablet, Rfl: 11   Multiple Vitamin (MULTIVITAMIN) tablet, Take 1 tablet by mouth daily., Disp: , Rfl:    polyethylene glycol (MIRALAX / GLYCOLAX) packet, Take 17 g by mouth daily., Disp: , Rfl:   Review of Systems:  Constitutional: Denies fever, chills, diaphoresis, appetite change and fatigue.  HEENT: Denies photophobia, eye pain, redness, hearing loss, ear pain, congestion, sore throat, rhinorrhea, sneezing, mouth sores, trouble swallowing, neck pain, neck stiffness and tinnitus.   Respiratory: Denies SOB, DOE, cough, chest tightness,  and wheezing.   Cardiovascular: Denies chest pain, palpitations and leg swelling.  Gastrointestinal: Denies nausea, vomiting, abdominal pain, diarrhea, constipation, blood in stool and abdominal distention.  Genitourinary: Denies dysuria, urgency, frequency, hematuria, flank pain and difficulty urinating.  Endocrine: Denies: hot or cold intolerance, sweats, changes in hair or nails, polyuria, polydipsia. Musculoskeletal: Denies myalgias, back pain, joint swelling, arthralgias and gait problem.  Skin: Denies pallor, rash and wound.  Neurological: Denies dizziness, seizures, syncope, weakness, light-headedness, numbness and headaches.  Hematological: Denies adenopathy. Easy bruising, personal or family bleeding history  Psychiatric/Behavioral: Denies suicidal ideation, mood changes, confusion, nervousness, sleep disturbance and agitation    Physical Exam: Vitals:   05/20/22 0703  BP: 120/84  Pulse: 80  Temp: 98.5 F (36.9 C)  TempSrc: Oral  SpO2: 96%  Weight: 197 lb (89.4 kg)    Body mass index is 32.78 kg/m.   Constitutional: NAD, calm, comfortable Eyes: PERRL, lids and conjunctivae normal, wears corrective lenses ENMT: Mucous membranes are moist.  Respiratory: clear to auscultation bilaterally, no wheezing, no crackles. Normal respiratory effort. No accessory muscle use.   Cardiovascular: Regular rate and rhythm, no murmurs / rubs / gallops. No extremity edema.  Psychiatric: Normal judgment and insight. Alert and oriented x 3. Normal mood.    Impression and Plan:  Type 2 diabetes mellitus with other specified complication, without long-term current use of insulin ([place]) - Plan: POCT glycosylated hemoglobin (Hb A1C)  Primary hypertension  Mild persistent asthma without complication  Microalbuminuria due to type 2 diabetes mellitus (HCC)   -A1c of 6.0 demonstrates excellent diabetic control. -Blood pressure is fairly well-controlled. -LDL of 67 is at goal. -We did discuss GLP-1/GIP medication for both management of diabetes and weight loss.  She will consider.  Time spent:32 minutes reviewing chart, interviewing and examining patient and formulating plan of care.       Lelon Frohlich, MD [facility]

## 2022-06-05 ENCOUNTER — Ambulatory Visit (INDEPENDENT_AMBULATORY_CARE_PROVIDER_SITE_OTHER): Payer: 59 | Admitting: Family Medicine

## 2022-06-05 ENCOUNTER — Encounter (INDEPENDENT_AMBULATORY_CARE_PROVIDER_SITE_OTHER): Payer: Self-pay | Admitting: Family Medicine

## 2022-06-05 VITALS — BP 127/81 | HR 66 | Temp 98.3°F | Ht 64.0 in | Wt 196.0 lb

## 2022-06-05 DIAGNOSIS — J45909 Unspecified asthma, uncomplicated: Secondary | ICD-10-CM

## 2022-06-05 DIAGNOSIS — E669 Obesity, unspecified: Secondary | ICD-10-CM | POA: Diagnosis not present

## 2022-06-05 DIAGNOSIS — E1169 Type 2 diabetes mellitus with other specified complication: Secondary | ICD-10-CM | POA: Diagnosis not present

## 2022-06-05 DIAGNOSIS — Z7984 Long term (current) use of oral hypoglycemic drugs: Secondary | ICD-10-CM

## 2022-06-05 DIAGNOSIS — I10 Essential (primary) hypertension: Secondary | ICD-10-CM

## 2022-06-05 DIAGNOSIS — Z6833 Body mass index (BMI) 33.0-33.9, adult: Secondary | ICD-10-CM

## 2022-06-12 NOTE — Progress Notes (Signed)
Office: [phone]  /  Fax: [phone]   Initial Visit  Victoria [name] was seen in clinic today to evaluate for obesity. She is interested in losing weight to improve overall health and reduce the risk of weight related complications. She presents today to review program treatment options, initial physical assessment, and evaluation.     She was referred by: Self-Referral  When asked what else they would like to accomplish? She states: Lose a target amount of weight : 35 lbs, she was 165 4 years ago.  When asked how has your weight affected you? She states: Having fatigue  Some associated conditions: Hypertension and Diabetes  Contributing factors: Family history, Reduced physical activity, Menopause, and Other: Sedentary job.  Weight promoting medications identified: Steroids and Other: Asthma since childhood.  Current nutrition plan: Other: Boredom snacking at night, husband works second shift, [employer] weeks.  Patient is sensitive to dairy, spicy, raw veggies.  Current level of physical activity: Other: Yoga.  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 Visceral Fat Rating:11, Body Fat%:40.7,   General:  Alert, oriented and cooperative. Patient is in no acute distress.  Respiratory: Normal  respiratory effort, no problems with respiration noted  Extremities: Normal range of motion.    Mental Status: Normal mood and affect. Normal behavior. Normal judgment and thought content.   Assessment and Plan:  1. Type 2 diabetes mellitus with other specified complication, unspecified whether long term insulin use (HCC) A1c 6.0 on 05/20/2022.  Patient is on metformin 500 mg once a day with food.  Patient denies GI intolerance.  Continue metformin, begin medical weight loss, plan to add Mounjaro.  2. Asthma, unspecified asthma severity, unspecified whether complicated, unspecified whether persistent Managed by Dr. Elsworth Soho.  Patient is doing well with Advair twice daily, Singulair nightly, albuterol as needed.  Patient is able to exercise.  Continue management plan per Dr. Elsworth Soho  3. Essential hypertension Blood pressure is well-controlled on HCTZ 25 mg daily, losartan 25 mg daily.  Patient denies chest pain today.  Look for blood pressure improvements with weight loss.  4. Obesity,current BMI 33.6 1.  Reviewed bioimpedance. 2.  Discussed potential addition of Mounjaro for both type 2 diabetes mellitus and obesity.  We reviewed weight, biometrics, associated medical conditions and contributing factors with patient. She would benefit from weight loss therapy via a modified calorie, low-carb, high-protein nutritional plan tailored to their REE (resting energy expenditure) which will be determined by indirect calorimetry.  We will also assess for cardiometabolic risk and nutritional derangements via fasting serologies at her next appointment.     Obesity Treatment / Action Plan:  Patient will  work on garnering support from family and friends to begin weight loss journey. Will work on eliminating or reducing the presence of highly palatable, calorie dense foods in the home. Will complete provided nutritional and psychosocial assessment questionnaire before the next appointment. Will be  scheduled for indirect calorimetry to determine resting energy expenditure in a fasting state.  This will allow [place] to create a reduced calorie, high-protein meal plan to promote loss of fat mass while preserving muscle mass. Was counseled on nutritional approaches to weight loss and benefits of complex carbs and high quality protein as part of nutritional weight management. Was counseled on pharmacotherapy and role as an adjunct in weight management.   Obesity Education Performed Today:  She was weighed on the bioimpedance scale and results were discussed and documented in the synopsis.  We discussed obesity as a disease and the importance of a more detailed evaluation of all the factors contributing to the disease.  We discussed the importance of long term lifestyle changes which include nutrition, exercise and behavioral modifications as well as the importance of customizing this to her specific health and social needs.  We discussed the benefits of reaching a healthier weight to alleviate the symptoms of existing conditions and reduce the risks of the biomechanical, metabolic and psychological effects of obesity.  Victoria [name] appears to be in the action stage of change and states they are ready to start intensive lifestyle modifications and behavioral modifications.  30 minutes was spent today on this visit including the above counseling, pre-visit chart review, and post-visit documentation.  Reviewed by clinician on day of visit: allergies, medications, problem list, medical history, surgical history, family history, social history, and previous encounter notes.  I, Davy Pique, am acting as Location manager for Loyal Gambler, DO.  I have reviewed the above documentation for accuracy and completeness, and I agree with the above. Dell Ponto, DO

## 2022-06-16 ENCOUNTER — Ambulatory Visit (INDEPENDENT_AMBULATORY_CARE_PROVIDER_SITE_OTHER): Payer: 59 | Admitting: Internal Medicine

## 2022-06-16 VITALS — BP 128/68 | HR 65 | Temp 98.1°F | Resp 98 | Wt 198.0 lb

## 2022-06-16 DIAGNOSIS — E1169 Type 2 diabetes mellitus with other specified complication: Secondary | ICD-10-CM | POA: Diagnosis not present

## 2022-06-16 DIAGNOSIS — E669 Obesity, unspecified: Secondary | ICD-10-CM | POA: Diagnosis not present

## 2022-06-16 MED ORDER — TIRZEPATIDE 2.5 MG/0.5ML ~~LOC~~ SOAJ: 2.5000 mg | SUBCUTANEOUS | 0 refills | Status: DC

## 2022-06-16 NOTE — Progress Notes (Signed)
Established Patient Office Visit     CC/Reason for Visit: Discuss weight loss  HPI: Victoria [name] is a 61 y.o. female who is coming in today for the above mentioned reasons. Past Medical History is significant for: Type 2 diabetes that has been well-controlled, hypertension and hyperlipidemia as well as asthma.  She feels well today.  She has tried lifestyle modifications for weight loss.  She would like to consider initiating a weight loss medication.   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   Asthma    Fibroids, subserous    GERD (gastroesophageal reflux disease)    Headache(784.0)    tension- not an issue   Hypertension    IGT (impaired glucose tolerance) 11/04/2019   Torn meniscus 04/2020   right   Urticaria     Past Surgical History:  Procedure Laterality Date   BALLOON DILATION N/A 11/19/2015   Procedure: BALLOON DILATION;  Surgeon: Garlan Fair, MD;  Location: [facility];  Service: Endoscopy;  Laterality: N/A;   BUNIONECTOMY Bilateral    COLONOSCOPY WITH PROPOFOL N/A 10/05/2012   Procedure: COLONOSCOPY WITH PROPOFOL;  Surgeon: Garlan Fair, MD;  Location: [facility];  Service: Endoscopy;  Laterality: N/A;   ESOPHAGOGASTRODUODENOSCOPY (EGD) WITH PROPOFOL N/A 11/19/2015   Procedure: ESOPHAGOGASTRODUODENOSCOPY (EGD) WITH PROPOFOL;  Surgeon: Garlan Fair, MD;  Location: [facility];  Service: Endoscopy;  Laterality: N/A;   TUBAL LIGATION  27 years ago    Social History:  reports that she has never smoked. She has never used smokeless tobacco. She reports that she does not drink alcohol and does not use drugs.  Allergies: Allergies  Allergen Reactions   Shellfish Allergy Shortness Of Breath and Itching   Sulfonamide Derivatives Itching, Swelling and Other (See Comments)    Throat swells   Meloxicam Swelling   Nasonex  [Mometasone Furoate] Other (See Comments)    Nose bleeds    Family History:  Family History  Problem Relation Age of Onset   High Cholesterol Mother    Osteoporosis Mother    Hypertension Mother    Emphysema Father    Lung cancer Father    Hypertension Sister    Migraines Daughter    Hypertension Son        exercise   Asthma Son    COPD Other    Atopy Neg Hx    Eczema Neg Hx    Immunodeficiency Neg Hx    Urticaria Neg Hx      Current Outpatient Medications:    ADVAIR DISKUS 250-50 MCG/ACT AEPB, INHALE 1 PUFF INTO THE LUNGS IN THE MORNING AND AT BEDTIME, Disp: 60 each, Rfl: 3   albuterol (PROVENTIL HFA) 108 (90 Base) MCG/ACT inhaler, Inhale two puffs every 4-6 hours if needed for cough or wheeze, Disp: 18 g, Rfl: 1   atorvastatin (LIPITOR) 20 MG tablet, TAKE 1 TABLET(20 MG) BY MOUTH DAILY, Disp: 90 tablet, Rfl: 1   azelastine (ASTELIN) 0.1 % nasal spray, PLACE 2 SPRAYS INTO BOTH NOSTRILS TWICE DAILY, Disp: 30 mL, Rfl: 5   cetirizine (ZYRTEC) 10 MG tablet, Take 10 mg by mouth as needed for allergies. , Disp: , Rfl:    cholecalciferol (VITAMIN D3) 25 MCG (1000 UNIT) tablet, Take 1,000 Units by mouth daily., Disp: , Rfl:    Cyanocobalamin (B-12 PO), Take by mouth., Disp: , Rfl:  diphenhydrAMINE (BENADRYL) 25 MG tablet, Take 25 mg by mouth at bedtime as needed for allergies or sleep. , Disp: , Rfl:    docusate sodium (COLACE) 100 MG capsule, Take 100 mg by mouth daily., Disp: , Rfl:    EPINEPHrine (AUVI-Q) 0.3 mg/0.3 mL IJ SOAJ injection, Use as directed for severe allergic reaction, Disp: 2 each, Rfl: 2   fluticasone (FLONASE) 50 MCG/ACT nasal spray, Place 2 sprays into both nostrils daily as needed for allergies., Disp: 16 g, Rfl: 11   hydrochlorothiazide (HYDRODIURIL) 25 MG tablet, TAKE 1 TABLET DAILY (LAST REFILL, NEED APPOINTMENT), Disp: 90 tablet, Rfl: 1   losartan (COZAAR) 25 MG tablet, TAKE 1 TABLET(25 MG) BY MOUTH DAILY, Disp: 90 tablet, Rfl: 1   metFORMIN (GLUCOPHAGE)  500 MG tablet, TAKE 1 TABLET(500 MG) BY MOUTH AT BEDTIME, Disp: 90 tablet, Rfl: 1   montelukast (SINGULAIR) 10 MG tablet, TAKE 1 TABLET AT BEDTIME (NEEDS AN OFFICE VISIT FOR FURTHER REFILLS), Disp: 30 tablet, Rfl: 11   Multiple Vitamin (MULTIVITAMIN) tablet, Take 1 tablet by mouth daily., Disp: , Rfl:    polyethylene glycol (MIRALAX / GLYCOLAX) packet, Take 17 g by mouth daily., Disp: , Rfl:    tirzepatide (MOUNJARO) 2.5 MG/0.5ML Pen, Inject 2.5 mg into the skin once a week., Disp: 2 mL, Rfl: 0  Review of Systems:  Constitutional: Denies fever, chills, diaphoresis, appetite change and fatigue.  HEENT: Denies photophobia, eye pain, redness, hearing loss, ear pain, congestion, sore throat, rhinorrhea, sneezing, mouth sores, trouble swallowing, neck pain, neck stiffness and tinnitus.   Respiratory: Denies SOB, DOE, cough, chest tightness,  and wheezing.   Cardiovascular: Denies chest pain, palpitations and leg swelling.  Gastrointestinal: Denies nausea, vomiting, abdominal pain, diarrhea, constipation, blood in stool and abdominal distention.  Genitourinary: Denies dysuria, urgency, frequency, hematuria, flank pain and difficulty urinating.  Endocrine: Denies: hot or cold intolerance, sweats, changes in hair or nails, polyuria, polydipsia. Musculoskeletal: Denies myalgias, back pain, joint swelling, arthralgias and gait problem.  Skin: Denies pallor, rash and wound.  Neurological: Denies dizziness, seizures, syncope, weakness, light-headedness, numbness and headaches.  Hematological: Denies adenopathy. Easy bruising, personal or family bleeding history  Psychiatric/Behavioral: Denies suicidal ideation, mood changes, confusion, nervousness, sleep disturbance and agitation    Physical Exam: Vitals:   06/16/22 0750  BP: 128/68  Pulse: 65  Resp: (!) 98  Temp: 98.1 F (36.7 C)  TempSrc: Oral  Weight: 198 lb (89.8 kg)    Body mass index is 33.99 kg/m.   Constitutional: NAD, calm,  comfortable Eyes: PERRL, lids and conjunctivae normal, wears corrective lenses ENMT: Mucous membranes are moist.  Respiratory: clear to auscultation bilaterally, no wheezing, no crackles. Normal respiratory effort. No accessory muscle use.  Cardiovascular: Regular rate and rhythm, no murmurs / rubs / gallops. No extremity edema.  Psychiatric: Normal judgment and insight. Alert and oriented x 3. Normal mood.    Impression and Plan:  Type 2 diabetes mellitus with other specified complication, without long-term current use of insulin ([place]) - Plan: tirzepatide (MOUNJARO) 2.5 MG/0.5ML Pen  Obesity (BMI 30.0-34.9)  -Discussed healthy lifestyle, including increased physical activity and better food choices to promote weight loss. -She has elected to start [facility].  Will start at 2.5 mg weekly with goal to increase dose every month if tolerated.  We have had a lengthy discussion in regards to potential side effects and complications.  Time spent:31 minutes reviewing chart, interviewing and examining patient and formulating plan of care.      Bassy Fetterly  Isaac Bliss, MD [facility]

## 2022-06-17 ENCOUNTER — Telehealth: Payer: Self-pay

## 2022-06-17 ENCOUNTER — Other Ambulatory Visit (HOSPITAL_COMMUNITY): Payer: Self-pay

## 2022-06-17 NOTE — Telephone Encounter (Signed)
Pharmacy Patient Advocate Encounter   Received notification from [employer] that prior authorization for [facility] 2.'5mg'$ /0.78m is required/requested.  Per Representative ''Dre'' at [employer]    PA submitted on 06/17/22 to (ins) [employer] via CoverMyMeds Case # [id number]& phone number [phone] is pending

## 2022-06-18 ENCOUNTER — Other Ambulatory Visit (HOSPITAL_COMMUNITY): Payer: Self-pay

## 2022-06-19 ENCOUNTER — Other Ambulatory Visit (HOSPITAL_COMMUNITY): Payer: Self-pay

## 2022-06-20 ENCOUNTER — Other Ambulatory Visit (HOSPITAL_COMMUNITY): Payer: Self-pay

## 2022-06-20 NOTE — Telephone Encounter (Signed)
Pharmacy Patient Advocate Encounter  Prior Authorization for [facility] 2.'5mg'$ /0.55m  has been Approved.    Case # [id number] [employer] at [phone] Effective dates: 11.21.23 through 12.20.24  [facility] (The medication has to be ordered and will be available for pickup anytime after Wednesday 12/27)

## 2022-06-24 ENCOUNTER — Other Ambulatory Visit: Payer: Self-pay | Admitting: Internal Medicine

## 2022-06-24 DIAGNOSIS — E1169 Type 2 diabetes mellitus with other specified complication: Secondary | ICD-10-CM

## 2022-06-27 ENCOUNTER — Telehealth: Payer: Self-pay | Admitting: Pulmonary Disease

## 2022-06-27 MED ORDER — FLUTICASONE-SALMETEROL 250-50 MCG/ACT IN AEPB: 1.0000 | INHALATION_SPRAY | Freq: Two times a day (BID) | RESPIRATORY_TRACT | 3 refills | Status: DC

## 2022-06-27 NOTE — Telephone Encounter (Signed)
Patient called to request a script for her Advair Diskus to be sent thru [employer] because the other pharmacy did not have it.  Please advise and call patient to discuss at [phone]

## 2022-07-03 ENCOUNTER — Ambulatory Visit: Payer: 59 | Admitting: Obstetrics and Gynecology

## 2022-07-11 ENCOUNTER — Encounter: Payer: Self-pay | Admitting: Internal Medicine

## 2022-07-14 ENCOUNTER — Other Ambulatory Visit: Payer: Self-pay | Admitting: Internal Medicine

## 2022-07-14 DIAGNOSIS — E1169 Type 2 diabetes mellitus with other specified complication: Secondary | ICD-10-CM

## 2022-07-14 MED ORDER — TIRZEPATIDE 5 MG/0.5ML ~~LOC~~ SOAJ: 5.0000 mg | SUBCUTANEOUS | 0 refills | Status: DC

## 2022-08-18 ENCOUNTER — Other Ambulatory Visit: Payer: Self-pay | Admitting: Internal Medicine

## 2022-08-18 DIAGNOSIS — E1169 Type 2 diabetes mellitus with other specified complication: Secondary | ICD-10-CM

## 2022-08-20 ENCOUNTER — Encounter: Payer: Self-pay | Admitting: Internal Medicine

## 2022-08-20 NOTE — Progress Notes (Signed)
62 y.o. G34P2002 Married [place] American female here for annual exam.    Denies vaginal bleeding and spotting.  Had postmenopausal bleeding in August 2023.  Negative endometrial biopsy.  US showed 3 small subserosal fibroids.  Now on Monjaro.  Lost 16 - 17 pounds.   A1C 6.0 on 05/20/22.  PCP:   Dr. Jerilee Hoh  Patient's last menstrual period was 03/30/2012.           Sexually active: Yes.    The current method of family planning is BTL/PMP.    Exercising: Yes.     walking Smoker:  no  Health Maintenance: Pap:  03-05-22 neg HPV HR neg, 06-29-20 neg HPV HR neg, 04-16-17 neg HPV HR neg  History of abnormal Pap:  yes, Hx LGSIL in 2010  MMG:   03-27-22 left breast f/u u/s birads 2: probably benign, 09-13-21 diagnostic left breast with u/s-see epic, 08-19-21 bilateral.  Dx mammogram and left breast US 09/26/22.  Colonoscopy:  2014 BMD:   n/a  Result  n/a TDaP:  06/30/09 Gardasil:   no HIV: neg in preg Hep C: 10/21/16 Neg Screening Labs:  PCP   reports that she has never smoked. She has never used smokeless tobacco. She reports that she does not drink alcohol and does not use drugs.  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   Asthma    Fibroids, subserous    GERD (gastroesophageal reflux disease)    Headache(784.0)    tension- not an issue   Hypertension    IGT (impaired glucose tolerance) 11/04/2019   Torn meniscus 04/2020   right   Urticaria     Past Surgical History:  Procedure Laterality Date   BALLOON DILATION N/A 11/19/2015   Procedure: BALLOON DILATION;  Surgeon: Garlan Fair, MD;  Location: [facility];  Service: Endoscopy;  Laterality: N/A;   BUNIONECTOMY Bilateral    COLONOSCOPY WITH PROPOFOL N/A 10/05/2012   Procedure: COLONOSCOPY WITH PROPOFOL;  Surgeon: Garlan Fair, MD;  Location: [facility];  Service: Endoscopy;  Laterality: N/A;   ESOPHAGOGASTRODUODENOSCOPY (EGD) WITH  PROPOFOL N/A 11/19/2015   Procedure: ESOPHAGOGASTRODUODENOSCOPY (EGD) WITH PROPOFOL;  Surgeon: Garlan Fair, MD;  Location: [facility];  Service: Endoscopy;  Laterality: N/A;   TUBAL LIGATION  27 years ago    Current Outpatient Medications  Medication Sig Dispense Refill   albuterol (PROVENTIL HFA) 108 (90 Base) MCG/ACT inhaler Inhale two puffs every 4-6 hours if needed for cough or wheeze 18 g 1   atorvastatin (LIPITOR) 20 MG tablet TAKE 1 TABLET(20 MG) BY MOUTH DAILY 90 tablet 0   azelastine (ASTELIN) 0.1 % nasal spray PLACE 2 SPRAYS INTO BOTH NOSTRILS TWICE DAILY 30 mL 5   cetirizine (ZYRTEC) 10 MG tablet Take 10 mg by mouth as needed for allergies.      cholecalciferol (VITAMIN D3) 25 MCG (1000 UNIT) tablet Take 1,000 Units by mouth daily.     Cyanocobalamin (B-12 PO) Take by mouth.     diphenhydrAMINE (BENADRYL) 25 MG tablet Take 25 mg by mouth at bedtime as needed for allergies or sleep.      docusate sodium (COLACE) 100 MG capsule Take 100 mg by mouth daily.     EPINEPHrine (AUVI-Q) 0.3 mg/0.3 mL IJ SOAJ injection Use as directed for severe allergic reaction 2 each 2   fluticasone (FLONASE) 50 MCG/ACT nasal spray Place 2 sprays into  both nostrils daily as needed for allergies. 16 g 11   fluticasone-salmeterol (ADVAIR DISKUS) 250-50 MCG/ACT AEPB Inhale 1 puff into the lungs 2 (two) times daily. in the morning and at bedtime. 60 each 3   hydrochlorothiazide (HYDRODIURIL) 25 MG tablet TAKE 1 TABLET DAILY (LAST REFILL, NEED APPOINTMENT) 90 tablet 1   losartan (COZAAR) 25 MG tablet TAKE 1 TABLET(25 MG) BY MOUTH DAILY 90 tablet 1   metFORMIN (GLUCOPHAGE) 500 MG tablet TAKE 1 TABLET(500 MG) BY MOUTH AT BEDTIME 90 tablet 1   montelukast (SINGULAIR) 10 MG tablet TAKE 1 TABLET AT BEDTIME (NEEDS AN OFFICE VISIT FOR FURTHER REFILLS) 30 tablet 11   Multiple Vitamin (MULTIVITAMIN) tablet Take 1 tablet by mouth daily.     polyethylene glycol (MIRALAX / GLYCOLAX) packet Take 17 g by mouth daily.      tirzepatide [facility]) 5 MG/0.5ML Pen Inject 5 mg into the skin once a week. 2 mL 0   No current facility-administered medications for this visit.    Family History  Problem Relation Age of Onset   High Cholesterol Mother    Osteoporosis Mother    Hypertension Mother    Emphysema Father    Lung cancer Father    Hypertension Sister    Migraines Daughter    Hypertension Son        exercise   Asthma Son    COPD Other    Atopy Neg Hx    Eczema Neg Hx    Immunodeficiency Neg Hx    Urticaria Neg Hx     Review of Systems  All other systems reviewed and are negative.   Exam:   BP 120/74 (BP Location: Right Arm, Patient Position: Sitting, Cuff Size: Normal)   Pulse 64   Ht '5\' 4"'$  (1.626 m)   Wt 181 lb (82.1 kg)   LMP 03/30/2012 Comment: BTL  SpO2 97%   BMI 31.07 kg/m     General appearance: alert, cooperative and appears stated age Head: normocephalic, without obvious abnormality, atraumatic Neck: no adenopathy, supple, symmetrical, trachea midline and thyroid normal to inspection and palpation Lungs: clear to auscultation bilaterally Breasts: normal appearance, no masses or tenderness, No nipple retraction or dimpling, No nipple discharge or bleeding, No axillary adenopathy Heart: regular rate and rhythm Abdomen: umbilical hernia (reducible), abdomen is soft, non-tender; no masses, no organomegaly Extremities: extremities normal, atraumatic, no cyanosis or edema Skin: skin color, texture, turgor normal. No rashes or lesions Lymph nodes: cervical, supraclavicular, and axillary nodes normal. Neurologic: grossly normal  Pelvic: External genitalia:  no lesions              No abnormal inguinal nodes palpated.              Urethra:  normal appearing urethra with no masses, tenderness or lesions              Bartholins and Skenes: normal                 Vagina: normal appearing vagina with normal color and yellow green curd discharge, no lesions.  Patient accepts testing  today for vaginitis.               Cervix: no lesions              Pap taken: no Bimanual Exam:  Uterus:  normal size, contour, position, consistency, mobility, non-tender              Adnexa: no mass, fullness, tenderness  Rectal exam: yes.  Confirms.              Anus:  normal sphincter tone, no lesions  Chaperone was present for exam:  Emily  Assessment:   Well woman visit with gynecologic exam. Remote hx LGSIL.  Fibroids.  Umbilical hernia.  Vaginal discharge.   Colon cancer screening.   Plan: Mammogram screening discussed. Self breast awareness reviewed. Pap and HR HPV 2028. Guidelines for Calcium, Vitamin D, regular exercise program including cardiovascular and weight bearing exercise. Wet prep:  yeast present.  Negative for clue cells and negative for trichomonas. Terazol 3 pv at hs x 3 nights.  Referral for colonoscopy.   Follow up annually and prn.   After visit summary provided.

## 2022-08-21 ENCOUNTER — Other Ambulatory Visit: Payer: Self-pay | Admitting: Internal Medicine

## 2022-08-21 DIAGNOSIS — E1169 Type 2 diabetes mellitus with other specified complication: Secondary | ICD-10-CM

## 2022-08-21 MED ORDER — TIRZEPATIDE 5 MG/0.5ML ~~LOC~~ SOAJ: 5.0000 mg | SUBCUTANEOUS | 0 refills | Status: DC

## 2022-08-23 ENCOUNTER — Other Ambulatory Visit: Payer: Self-pay | Admitting: Internal Medicine

## 2022-08-23 DIAGNOSIS — E1169 Type 2 diabetes mellitus with other specified complication: Secondary | ICD-10-CM

## 2022-09-02 ENCOUNTER — Encounter: Payer: Self-pay | Admitting: Obstetrics and Gynecology

## 2022-09-02 ENCOUNTER — Ambulatory Visit (INDEPENDENT_AMBULATORY_CARE_PROVIDER_SITE_OTHER): Payer: 59 | Admitting: Obstetrics and Gynecology

## 2022-09-02 VITALS — BP 120/74 | HR 64 | Ht 64.0 in | Wt 181.0 lb

## 2022-09-02 DIAGNOSIS — Z01419 Encounter for gynecological examination (general) (routine) without abnormal findings: Secondary | ICD-10-CM

## 2022-09-02 DIAGNOSIS — B3731 Acute candidiasis of vulva and vagina: Secondary | ICD-10-CM

## 2022-09-02 DIAGNOSIS — N898 Other specified noninflammatory disorders of vagina: Secondary | ICD-10-CM

## 2022-09-02 DIAGNOSIS — Z1211 Encounter for screening for malignant neoplasm of colon: Secondary | ICD-10-CM

## 2022-09-02 LAB — WET PREP FOR TRICH, YEAST, CLUE

## 2022-09-02 MED ORDER — TERCONAZOLE 0.8 % VA CREA: 1.0000 | TOPICAL_CREAM | Freq: Every day | VAGINAL | 0 refills | Status: DC

## 2022-09-02 NOTE — Patient Instructions (Signed)
EXERCISE AND DIET:  We recommended that you start or continue a regular exercise program for good health. Regular exercise means any activity that makes your heart beat faster and makes you sweat.  We recommend exercising at least 30 minutes per day at least 3 days a week, preferably 4 or 5.  We also recommend a diet low in fat and sugar.  Inactivity, poor dietary choices and obesity can cause diabetes, heart attack, stroke, and kidney damage, among others.    ALCOHOL AND SMOKING:  Women should limit their alcohol intake to no more than 7 drinks/beers/glasses of wine (combined, not each!) per week. Moderation of alcohol intake to this level decreases your risk of breast cancer and liver damage. And of course, no recreational drugs are part of a healthy lifestyle.  And absolutely no smoking or even second hand smoke. Most people know smoking can cause heart and lung diseases, but did you know it also contributes to weakening of your bones? Aging of your skin?  Yellowing of your teeth and nails?  CALCIUM AND VITAMIN D:  Adequate intake of calcium and Vitamin D are recommended.  The recommendations for exact amounts of these supplements seem to change often, but generally speaking 600 mg of calcium (either carbonate or citrate) and 800 units of Vitamin D per day seems prudent. Certain women may benefit from higher intake of Vitamin D.  If you are among these women, your doctor will have told you during your visit.    PAP SMEARS:  Pap smears, to check for cervical cancer or precancers,  have traditionally been done yearly, although recent scientific advances have shown that most women can have pap smears less often.  However, every woman still should have a physical exam from her gynecologist every year. It will include a breast check, inspection of the vulva and vagina to check for abnormal growths or skin changes, a visual exam of the cervix, and then an exam to evaluate the size and shape of the uterus and  ovaries.  And after 60 years of age, a rectal exam is indicated to check for rectal cancers. We will also provide age appropriate advice regarding health maintenance, like when you should have certain vaccines, screening for sexually transmitted diseases, bone density testing, colonoscopy, mammograms, etc.   MAMMOGRAMS:  All women over 40 years old should have a yearly mammogram. Many facilities now offer a "3D" mammogram, which may cost around $50 extra out of pocket. If possible,  we recommend you accept the option to have the 3D mammogram performed.  It both reduces the number of women who will be called back for extra views which then turn out to be normal, and it is better than the routine mammogram at detecting truly abnormal areas.    COLONOSCOPY:  Colonoscopy to screen for colon cancer is recommended for all women at age 50.  We know, you hate the idea of the prep.  We agree, BUT, having colon cancer and not knowing it is worse!!  Colon cancer so often starts as a polyp that can be seen and removed at colonscopy, which can quite literally save your life!  And if your first colonoscopy is normal and you have no family history of colon cancer, most women don't have to have it again for 10 years.  Once every ten years, you can do something that may end up saving your life, right?  We will be happy to help you get it scheduled when you are ready.    Be sure to check your insurance coverage so you understand how much it will cost.  It may be covered as a preventative service at no cost, but you should check your particular policy.    Calcium Content in Foods Calcium is the most abundant mineral in the body. Most of the body's calcium supply is stored in bones and teeth. Calcium helps many parts of the body function normally, including: Blood and blood vessels. Nerves. Hormones. Muscles. Bones and teeth. When your calcium stores are low, you may be at risk for low bone mass, bone loss, and broken bones  (fractures). When you get enough calcium, it helps to support strong bones and teeth throughout your life. Calcium is especially important for: Children during growth spurts. Girls during adolescence. Women who are pregnant or breastfeeding. Women after their menstrual cycle stops (postmenopause). Women whose menstrual cycle has stopped due to anorexia nervosa or regular intense exercise. People who cannot eat or digest dairy products. Vegans. Recommended daily amounts of calcium: Women (ages 19 to 50): 1,000 mg per day. Women (ages 51 and older): 1,200 mg per day. Men (ages 19 to 70): 1,000 mg per day. Men (ages 71 and older): 1,200 mg per day. Women (ages 9 to 18): 1,300 mg per day. Men (ages 9 to 18): 1,300 mg per day. General information Eat foods that are high in calcium. Try to get most of your calcium from food. Some people may benefit from taking calcium supplements. Check with your health care provider or diet and nutrition specialist (dietitian) before starting any calcium supplements. Calcium supplements may interact with certain medicines. Too much calcium may cause other health problems, such as constipation and kidney stones. For the body to absorb calcium, it needs vitamin D. Sources of vitamin D include: Skin exposure to direct sunlight. Foods, such as egg yolks, liver, mushrooms, saltwater fish, and fortified milk. Vitamin D supplements. Check with your health care provider or dietitian before starting any vitamin D supplements. What foods are high in calcium?  Foods that are high in calcium contain more than 100 milligrams per serving. Fruits Fortified orange juice or other fruit juice, 300 mg per 8 oz serving. Vegetables Collard greens, 360 mg per 8 oz serving. Kale, 100 mg per 8 oz serving. Bok choy, 160 mg per 8 oz serving. Grains Fortified ready-to-eat cereals, 100 to 1,000 mg per 8 oz serving. Fortified frozen waffles, 200 mg in 2 waffles. Oatmeal, 140 mg in  1 cup. Meats and other proteins Sardines, canned with bones, 325 mg per 3 oz serving. Salmon, canned with bones, 180 mg per 3 oz serving. Canned shrimp, 125 mg per 3 oz serving. Baked beans, 160 mg per 4 oz serving. Tofu, firm, made with calcium sulfate, 253 mg per 4 oz serving. Dairy Yogurt, plain, low-fat, 310 mg per 6 oz serving. Nonfat milk, 300 mg per 8 oz serving. American cheese, 195 mg per 1 oz serving. Cheddar cheese, 205 mg per 1 oz serving. Cottage cheese 2%, 105 mg per 4 oz serving. Fortified soy, rice, or almond milk, 300 mg per 8 oz serving. Mozzarella, part skim, 210 mg per 1 oz serving. The items listed above may not be a complete list of foods high in calcium. Actual amounts of calcium may be different depending on processing. Contact a dietitian for more information. What foods are lower in calcium? Foods that are lower in calcium contain 50 mg or less per serving. Fruits Apple, about 6 mg. Banana, about 12 mg.   Vegetables Lettuce, 19 mg per 2 oz serving. Tomato, about 11 mg. Grains Rice, 4 mg per 6 oz serving. Boiled potatoes, 14 mg per 8 oz serving. White bread, 6 mg per slice. Meats and other proteins Egg, 27 mg per 2 oz serving. Red meat, 7 mg per 4 oz serving. Chicken, 17 mg per 4 oz serving. Fish, cod, or trout, 20 mg per 4 oz serving. Dairy Cream cheese, regular, 14 mg per 1 Tbsp serving. Brie cheese, 50 mg per 1 oz serving. Parmesan cheese, 70 mg per 1 Tbsp serving. The items listed above may not be a complete list of foods lower in calcium. Actual amounts of calcium may be different depending on processing. Contact a dietitian for more information. Summary Calcium is an important mineral in the body because it affects many functions. Getting enough calcium helps support strong bones and teeth throughout your life. Try to get most of your calcium from food. Calcium supplements may interact with certain medicines. Check with your health care provider  or dietitian before starting any calcium supplements. This information is not intended to replace advice given to you by your health care provider. Make sure you discuss any questions you have with your health care provider. Document Revised: 10/12/2019 Document Reviewed: 10/12/2019 Elsevier Patient Education  2023 Elsevier Inc.  

## 2022-09-04 ENCOUNTER — Encounter: Payer: Self-pay | Admitting: Gastroenterology

## 2022-09-08 ENCOUNTER — Other Ambulatory Visit: Payer: Self-pay | Admitting: Pulmonary Disease

## 2022-09-12 ENCOUNTER — Other Ambulatory Visit: Payer: Self-pay | Admitting: Internal Medicine

## 2022-09-12 DIAGNOSIS — E1169 Type 2 diabetes mellitus with other specified complication: Secondary | ICD-10-CM

## 2022-09-22 ENCOUNTER — Other Ambulatory Visit: Payer: Self-pay | Admitting: Internal Medicine

## 2022-09-22 DIAGNOSIS — R809 Proteinuria, unspecified: Secondary | ICD-10-CM

## 2022-09-26 ENCOUNTER — Ambulatory Visit
Admission: RE | Admit: 2022-09-26 | Discharge: 2022-09-26 | Disposition: A | Discharge status: Home / Self Care | Payer: 59 | Source: Ambulatory Visit | Attending: Internal Medicine | Admitting: Internal Medicine

## 2022-09-26 ENCOUNTER — Other Ambulatory Visit: Payer: Self-pay | Admitting: Internal Medicine

## 2022-09-26 DIAGNOSIS — N6320 Unspecified lump in the left breast, unspecified quadrant: Secondary | ICD-10-CM

## 2022-10-01 ENCOUNTER — Other Ambulatory Visit: Payer: Self-pay | Admitting: Internal Medicine

## 2022-10-01 DIAGNOSIS — I10 Essential (primary) hypertension: Secondary | ICD-10-CM

## 2022-10-02 ENCOUNTER — Telehealth: Payer: Self-pay | Admitting: Pulmonary Disease

## 2022-10-02 NOTE — Telephone Encounter (Signed)
Pt called stating that she wants to switch from Dr. Elsworth Soho to a provider that is still at the [address] location as the [place] office is too far for her. Dr. Elsworth Soho please advise.

## 2022-10-09 NOTE — Telephone Encounter (Signed)
Called and spoke with pt letting her know the info per Dr. [name] and she verbalized understanding. Appt scheduled for pt with Dr. Val Eagle at [facility]. Nothing further needed.

## 2022-10-10 ENCOUNTER — Other Ambulatory Visit: Payer: Self-pay | Admitting: Internal Medicine

## 2022-10-10 DIAGNOSIS — E1169 Type 2 diabetes mellitus with other specified complication: Secondary | ICD-10-CM

## 2022-10-16 ENCOUNTER — Other Ambulatory Visit: Payer: Self-pay | Admitting: Internal Medicine

## 2022-10-16 DIAGNOSIS — E1169 Type 2 diabetes mellitus with other specified complication: Secondary | ICD-10-CM

## 2022-10-17 NOTE — Telephone Encounter (Signed)
Pt called to inform MD that the pharmacy told her MD has denied the refill.   Pt states she is completely out of the [facility].  Pt added that she was supposed to take it today (Fridays)  Pt was informed that MD is OOO on Fridays.  Preferred pharmacy: [phone] - [place], [place] - [address]

## 2022-10-20 ENCOUNTER — Telehealth: Payer: Self-pay | Admitting: Internal Medicine

## 2022-10-20 MED ORDER — MOUNJARO 5 MG/0.5ML ~~LOC~~ SOAJ: 5.0000 mg | SUBCUTANEOUS | 0 refills | Status: DC

## 2022-10-20 NOTE — Telephone Encounter (Signed)
Pt states the pharmacy is stating they don't have her prescription for  tirzepatide [facility]) 5 MG/0.5ML Pen .

## 2022-10-21 NOTE — Telephone Encounter (Signed)
Spoke with pharmacist and the prescription will be filled today.

## 2022-10-23 ENCOUNTER — Encounter: Payer: Self-pay | Admitting: Internal Medicine

## 2022-10-23 ENCOUNTER — Other Ambulatory Visit (HOSPITAL_BASED_OUTPATIENT_CLINIC_OR_DEPARTMENT_OTHER): Payer: Self-pay

## 2022-10-23 DIAGNOSIS — E1169 Type 2 diabetes mellitus with other specified complication: Secondary | ICD-10-CM

## 2022-10-23 MED ORDER — MOUNJARO 5 MG/0.5ML ~~LOC~~ SOAJ
5.0000 mg | SUBCUTANEOUS | 0 refills | Status: DC
  Filled 2022-10-23: qty 2, 28d supply, fill #0

## 2022-10-27 ENCOUNTER — Ambulatory Visit (INDEPENDENT_AMBULATORY_CARE_PROVIDER_SITE_OTHER): Payer: 59 | Admitting: Pulmonary Disease

## 2022-10-27 ENCOUNTER — Encounter: Payer: Self-pay | Admitting: Pulmonary Disease

## 2022-10-27 ENCOUNTER — Other Ambulatory Visit (HOSPITAL_COMMUNITY): Payer: Self-pay

## 2022-10-27 VITALS — BP 112/68 | HR 58 | Ht 64.0 in | Wt 182.2 lb

## 2022-10-27 DIAGNOSIS — J4530 Mild persistent asthma, uncomplicated: Secondary | ICD-10-CM

## 2022-10-27 DIAGNOSIS — G4733 Obstructive sleep apnea (adult) (pediatric): Secondary | ICD-10-CM

## 2022-10-27 MED ORDER — FLUTICASONE-SALMETEROL 250-50 MCG/ACT IN AEPB: INHALATION_SPRAY | RESPIRATORY_TRACT | 2 refills | Status: DC

## 2022-10-27 MED ORDER — ALBUTEROL SULFATE HFA 108 (90 BASE) MCG/ACT IN AERS: INHALATION_SPRAY | RESPIRATORY_TRACT | 2 refills | Status: DC

## 2022-10-27 MED ORDER — MOUNJARO 5 MG/0.5ML ~~LOC~~ SOAJ
5.0000 mg | SUBCUTANEOUS | 0 refills | Status: DC
  Filled 2022-10-27: qty 2, 28d supply, fill #0

## 2022-10-27 NOTE — Addendum Note (Signed)
Addended by: Kern Reap B on: 10/27/2022 02:34 PM   Modules accepted: Orders

## 2022-10-27 NOTE — Progress Notes (Signed)
Victoria [name]    [id number]    1960-07-24  Primary Care Physician:Hernandez [name], MD  Referring Physician: [name], [name], MD [address] [place],  [place] [address]  Chief complaint:   Patient being seen for follow-up of asthma  HPI:  Patient with mild persistent asthma On Advair which she uses regularly  Has not needed a rescue inhaler  Symptoms have been well-controlled  Has mild obstructive sleep apnea with an AHI of 6.2 As focused on weight loss efforts and has managed to lose weight and continues to do so  Exercises 2-3 times a week Has not been evaluated for asthma exacerbation recently but has kept on with follow-ups  She does have multiple allergies and remains on Singulair, Flonase, Astelin, Zyrtec  Has been functioning relatively well  Was seeing Dr. [name]  Stay okay Victoria [name] Outpatient Encounter Medications as of 10/27/2022  Medication Sig   albuterol (PROVENTIL HFA) 108 (90 Base) MCG/ACT inhaler Inhale two puffs every 4-6 hours if needed for cough or wheeze   atorvastatin (LIPITOR) 20 MG tablet TAKE 1 TABLET(20 MG) BY MOUTH DAILY   azelastine (ASTELIN) 0.1 % nasal spray PLACE 2 SPRAYS INTO BOTH NOSTRILS TWICE DAILY   cetirizine (ZYRTEC) 10 MG tablet Take 10 mg by mouth as needed for allergies.    cholecalciferol (VITAMIN D3) 25 MCG (1000 UNIT) tablet Take 1,000 Units by mouth daily.   Cyanocobalamin (B-12 PO) Take by mouth.   diphenhydrAMINE (BENADRYL) 25 MG tablet Take 25 mg by mouth at bedtime as needed for allergies or sleep.    docusate sodium (COLACE) 100 MG capsule Take 100 mg by mouth daily.   EPINEPHrine (AUVI-Q) 0.3 mg/0.3 mL IJ SOAJ injection Use as directed for severe allergic reaction   fluticasone (FLONASE) 50 MCG/ACT nasal spray Place 2 sprays into both nostrils daily as needed for allergies.   fluticasone-salmeterol (ADVAIR) 250-50 MCG/ACT AEPB USE 1 INHALATION TWICE A DAY IN THE MORNING  AND AT BEDTIME   hydrochlorothiazide (HYDRODIURIL) 25 MG tablet Take 1 tablet (25 mg total) by mouth daily.   losartan (COZAAR) 25 MG tablet TAKE 1 TABLET(25 MG) BY MOUTH DAILY   metFORMIN (GLUCOPHAGE) 500 MG tablet TAKE 1 TABLET(500 MG) BY MOUTH AT BEDTIME   montelukast (SINGULAIR) 10 MG tablet TAKE 1 TABLET AT BEDTIME (NEEDS AN OFFICE VISIT FOR FURTHER REFILLS)   Multiple Vitamin (MULTIVITAMIN) tablet Take 1 tablet by mouth daily.   polyethylene glycol (MIRALAX / GLYCOLAX) packet Take 17 g by mouth daily.   terconazole (TERAZOL 3) 0.8 % vaginal cream Place 1 applicator vaginally at bedtime. Use for 3 nights.   tirzepatide (MOUNJARO) 5 MG/0.5ML Pen Inject 5 mg into the skin once a week.   [DISCONTINUED] tirzepatide [facility]) 5 MG/0.5ML Pen Inject 5 mg into the skin once a week.   No facility-administered encounter medications on file as of 10/27/2022.    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   Asthma    Fibroids, subserous    GERD (gastroesophageal reflux disease)  Headache(784.0)    tension- not an issue   Hypertension    IGT (impaired glucose tolerance) 11/04/2019   Torn meniscus 04/2020   right   Urticaria     Past Surgical History:  Procedure Laterality Date   BALLOON DILATION N/A 11/19/2015   Procedure: BALLOON DILATION;  Surgeon: Charolett Bumpers, MD;  Location: [facility];  Service: Endoscopy;  Laterality: N/A;   BUNIONECTOMY Bilateral    COLONOSCOPY WITH PROPOFOL N/A 10/05/2012   Procedure: COLONOSCOPY WITH PROPOFOL;  Surgeon: Charolett Bumpers, MD;  Location: [facility];   Service: Endoscopy;  Laterality: N/A;   ESOPHAGOGASTRODUODENOSCOPY (EGD) WITH PROPOFOL N/A 11/19/2015   Procedure: ESOPHAGOGASTRODUODENOSCOPY (EGD) WITH PROPOFOL;  Surgeon: Charolett Bumpers, MD;  Location: [facility];  Service: Endoscopy;  Laterality: N/A;   TUBAL LIGATION  27 years ago    Family History  Problem Relation Age of Onset   High Cholesterol Mother    Osteoporosis Mother    Hypertension Mother    Emphysema Father    Lung cancer Father    Hypertension Sister    Migraines Daughter    Hypertension Son        exercise   Asthma Son    COPD Other    Atopy Neg Hx    Eczema Neg Hx    Immunodeficiency Neg Hx    Urticaria Neg Hx     Social History   Socioeconomic History   Marital status: Married    Spouse name: Not on file   Number of children: 2   Years of education: Not on file   Highest education level: 12th grade  Occupational History   Occupation: Therapist, occupational,    Employer: Network engineer  Tobacco Use   Smoking status: Never   Smokeless tobacco: Never  Vaping Use   Vaping Use: Never used  Substance and Sexual Activity   Alcohol use: No   Drug use: No   Sexual activity: Yes    Partners: Male    Birth control/protection: Surgical, Post-menopausal    Comment: Tubal Ligation  Other Topics Concern   Not on file  Social History Narrative   Work or School: Mudlogger - full time, like job      Home Situation: lives with husband      Spiritual Beliefs: Christian - baptist      Lifestyle: no regular exercise, diet "bad"   Social Determinants of Health   Financial Resource Strain: Low Risk  (06/16/2022)   Overall Financial Resource Strain (CARDIA)    Difficulty of Paying Living Expenses: Not hard at all  Food Insecurity: No Food Insecurity (06/16/2022)   Hunger Vital Sign    Worried About Running Out of Food in the Last Year: Never true    Ran Out of Food in the Last Year: Never true  Transportation Needs: No Transportation Needs  (06/16/2022)   PRAPARE - Administrator, Civil Service (Medical): No    Lack of Transportation (Non-Medical): No  Physical Activity: Unknown (06/16/2022)   Exercise Vital Sign    Days of Exercise per Week: 0 days    Minutes of Exercise per Session: Not on file  Stress: No Stress Concern Present (06/16/2022)   [employer] of Occupational Health - Occupational Stress Questionnaire    Feeling of Stress : Not at all  Social Connections: Moderately Integrated (06/16/2022)   Social Connection and Isolation Panel [NHANES]    Frequency of Communication with Friends and Family: More than three times a  week    Frequency of Social Gatherings with Friends and Family: Once a week    Attends Religious Services: More than 4 times per year    Active Member of [employer] or Organizations: No    Attends Engineer, structural: Not on file    Marital Status: Married  Catering manager Violence: Not on file    Review of Systems  Respiratory:  Negative for shortness of breath.     Vitals:   10/27/22 1435  BP: 112/68  Pulse: (!) 58  SpO2: 98%     Physical Exam Constitutional:      Appearance: Normal appearance.  HENT:     Head: Normocephalic.     Mouth/Throat:     Mouth: Mucous membranes are moist.  Eyes:     General: No scleral icterus. Cardiovascular:     Rate and Rhythm: Normal rate and regular rhythm.     Heart sounds: No murmur heard.    No friction rub.  Pulmonary:     Effort: No respiratory distress.     Breath sounds: No stridor. No wheezing or rhonchi.  Musculoskeletal:     Cervical back: No rigidity or tenderness.  Neurological:     Mental Status: She is alert.  Psychiatric:        Mood and Affect: Mood normal.    Data Reviewed: Sleep study from 2023 reviewed showing mild obstructive sleep apnea  Assessment:  Mild persistent asthma-controlled symptoms on albuterol  Multiple allergies -On Singulair, antihistamines  History of mild obstructive  sleep apnea -Has managed to lose a good amount of weight and continues to lose some weight  Plan/Recommendations: Refills for medication include Advair and albuterol  Continue other medications at present  Follow-up in 6 months  Encouraged to call with significant concerns  Avoid known triggers as able  Virl Diamond MD [facility] 10/27/2022, 2:51 PM  CC: [name], Estel*

## 2022-10-27 NOTE — Patient Instructions (Signed)
I will see you back in about 6 months  Continue using Advair, continue using Singulair, continue using albuterol as needed  Continue graded activities as tolerated  Avoid known triggers as able  Continue weight loss efforts

## 2022-10-28 ENCOUNTER — Other Ambulatory Visit (HOSPITAL_COMMUNITY): Payer: Self-pay

## 2022-11-06 ENCOUNTER — Other Ambulatory Visit (HOSPITAL_COMMUNITY): Payer: Self-pay

## 2022-11-21 ENCOUNTER — Ambulatory Visit (AMBULATORY_SURGERY_CENTER): Payer: 59

## 2022-11-21 ENCOUNTER — Other Ambulatory Visit: Payer: Self-pay | Admitting: Internal Medicine

## 2022-11-21 VITALS — Ht 64.0 in | Wt 182.0 lb

## 2022-11-21 DIAGNOSIS — E1169 Type 2 diabetes mellitus with other specified complication: Secondary | ICD-10-CM

## 2022-11-21 DIAGNOSIS — Z1211 Encounter for screening for malignant neoplasm of colon: Secondary | ICD-10-CM

## 2022-11-21 MED ORDER — NA SULFATE-K SULFATE-MG SULF 17.5-3.13-1.6 GM/177ML PO SOLN: 1.0000 | Freq: Once | ORAL | 0 refills | Status: AC

## 2022-11-21 NOTE — Progress Notes (Signed)
No egg or soy allergy known to patient  No issues known to pt with past sedation with any surgeries or procedures Patient denies ever being told they had issues or difficulty with intubation  No FH of Malignant Hyperthermia Pt is not on diet pills Pt is not on  home 02  Pt is not on blood thinners  Pt denies issues with constipation - pt uses OTC miralax daily  No A fib or A flutter Have any cardiac testing pending--no  Pt is ambulatory   Patient's chart reviewed by Cathlyn Parsons CNRA prior to previsit and patient appropriate for the [facility].  Previsit completed and red dot placed by patient's name on their procedure day (on provider's schedule).     PV completed. Prep instructions sent via mychart and to home address. Goodrx coupon for [employer] provided.  Pt instructed to use Singlecare.com or GoodRx for a price reduction on prep

## 2022-11-27 ENCOUNTER — Other Ambulatory Visit (HOSPITAL_COMMUNITY): Payer: Self-pay

## 2022-11-27 ENCOUNTER — Encounter: Payer: Self-pay | Admitting: Internal Medicine

## 2022-11-27 ENCOUNTER — Other Ambulatory Visit: Payer: Self-pay | Admitting: Internal Medicine

## 2022-11-27 DIAGNOSIS — E1169 Type 2 diabetes mellitus with other specified complication: Secondary | ICD-10-CM

## 2022-11-27 MED ORDER — MOUNJARO 5 MG/0.5ML ~~LOC~~ SOAJ
5.0000 mg | SUBCUTANEOUS | 0 refills | Status: DC
  Filled 2022-11-27: qty 2, 28d supply, fill #0

## 2022-12-08 ENCOUNTER — Other Ambulatory Visit: Payer: Self-pay | Admitting: Pulmonary Disease

## 2022-12-09 ENCOUNTER — Encounter: Payer: Self-pay | Admitting: Gastroenterology

## 2022-12-12 LAB — HM DIABETES EYE EXAM

## 2022-12-14 ENCOUNTER — Encounter: Payer: Self-pay | Admitting: Certified Registered Nurse Anesthetist

## 2022-12-18 ENCOUNTER — Telehealth: Payer: Self-pay | Admitting: Pulmonary Disease

## 2022-12-18 ENCOUNTER — Other Ambulatory Visit: Payer: Self-pay | Admitting: Pulmonary Disease

## 2022-12-18 NOTE — Telephone Encounter (Signed)
Pt. Had script for singular filled today but [facility] is saying they still can not complete the order

## 2022-12-19 ENCOUNTER — Other Ambulatory Visit: Payer: Self-pay

## 2022-12-19 ENCOUNTER — Encounter: Payer: Self-pay | Admitting: Gastroenterology

## 2022-12-19 ENCOUNTER — Ambulatory Visit (AMBULATORY_SURGERY_CENTER): Payer: 59 | Admitting: Gastroenterology

## 2022-12-19 VITALS — BP 116/84 | HR 58 | Temp 97.3°F | Resp 16 | Ht 64.0 in | Wt 182.0 lb

## 2022-12-19 DIAGNOSIS — K5790 Diverticulosis of intestine, part unspecified, without perforation or abscess without bleeding: Secondary | ICD-10-CM

## 2022-12-19 DIAGNOSIS — Z1211 Encounter for screening for malignant neoplasm of colon: Secondary | ICD-10-CM | POA: Diagnosis present

## 2022-12-19 DIAGNOSIS — D128 Benign neoplasm of rectum: Secondary | ICD-10-CM | POA: Diagnosis not present

## 2022-12-19 DIAGNOSIS — K621 Rectal polyp: Secondary | ICD-10-CM | POA: Diagnosis not present

## 2022-12-19 MED ORDER — SODIUM CHLORIDE 0.9 % IV SOLN: 500.0000 mL | Freq: Once | INTRAVENOUS | Status: DC

## 2022-12-19 MED ORDER — MONTELUKAST SODIUM 10 MG PO TABS: 10.0000 mg | ORAL_TABLET | Freq: Every day | ORAL | 11 refills | Status: DC

## 2022-12-19 NOTE — Progress Notes (Signed)
GASTROENTEROLOGY PROCEDURE H&P NOTE   Primary Care Physician: [name], [name], MD  HPI: Victoria [name] is a 62 y.o. female who presents for Colonoscopy for screening.  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   Asthma    Diabetes mellitus without complication (HCC)    Fibroids, subserous    GERD (gastroesophageal reflux disease)    Headache(784.0)    tension- not an issue   Hypertension    IGT (impaired glucose tolerance) 11/04/2019   Torn meniscus 04/2020   right   Urticaria    Past Surgical History:  Procedure Laterality Date   BALLOON DILATION N/A 11/19/2015   Procedure: BALLOON DILATION;  Surgeon: Charolett Bumpers, MD;  Location: [facility];  Service: Endoscopy;  Laterality: N/A;   BUNIONECTOMY Bilateral    COLONOSCOPY     COLONOSCOPY WITH PROPOFOL N/A 10/05/2012   Procedure: COLONOSCOPY WITH PROPOFOL;  Surgeon: Charolett Bumpers, MD;  Location: [facility];  Service: Endoscopy;  Laterality: N/A;   ESOPHAGOGASTRODUODENOSCOPY (EGD) WITH PROPOFOL N/A 11/19/2015   Procedure: ESOPHAGOGASTRODUODENOSCOPY (EGD) WITH PROPOFOL;  Surgeon: Charolett Bumpers, MD;  Location: [facility];  Service: Endoscopy;  Laterality: N/A;   TUBAL LIGATION  27 years ago   Current Outpatient Medications  Medication Sig Dispense Refill   albuterol (PROVENTIL HFA) 108 (90 Base) MCG/ACT inhaler Inhale two puffs every 4-6 hours if needed for cough or wheeze 18 g 2   atorvastatin (LIPITOR) 20 MG tablet TAKE 1 TABLET(20 MG) BY MOUTH DAILY 90 tablet 0   azelastine (ASTELIN) 0.1 % nasal spray PLACE 2 SPRAYS INTO BOTH NOSTRILS TWICE DAILY 30 mL 5   cholecalciferol (VITAMIN D3) 25 MCG (1000 UNIT) tablet Take 1,000 Units by mouth daily.     Cyanocobalamin (B-12 PO) Take by mouth.     docusate sodium (COLACE) 100 MG capsule Take 100 mg by mouth daily.     fluticasone-salmeterol (ADVAIR) 250-50 MCG/ACT  AEPB USE 1 INHALATION TWICE A DAY IN THE MORNING AND AT BEDTIME (NEED APPOINTMENT FOR FURTHER REFILLS) 180 each 3   hydrochlorothiazide (HYDRODIURIL) 25 MG tablet Take 1 tablet (25 mg total) by mouth daily. 90 tablet 1   losartan (COZAAR) 25 MG tablet TAKE 1 TABLET(25 MG) BY MOUTH DAILY 90 tablet 1   metFORMIN (GLUCOPHAGE) 500 MG tablet TAKE 1 TABLET(500 MG) BY MOUTH AT BEDTIME 90 tablet 1   montelukast (SINGULAIR) 10 MG tablet TAKE 1 TABLET AT BEDTIME (NEEDS AN OFFICE VISIT FOR FURTHER REFILLS) 30 tablet 11   Multiple Vitamin (MULTIVITAMIN) tablet Take 1 tablet by mouth daily.     polyethylene glycol (MIRALAX / GLYCOLAX) packet Take 17 g by mouth daily.     cetirizine (ZYRTEC) 10 MG tablet Take 10 mg by mouth as needed for allergies.      diphenhydrAMINE (BENADRYL) 25 MG tablet Take 25 mg by mouth at bedtime as needed for allergies or sleep.      EPINEPHrine (AUVI-Q) 0.3 mg/0.3 mL IJ SOAJ injection Use as directed for severe allergic reaction 2 each 2   fluticasone (FLONASE) 50 MCG/ACT nasal spray Place 2 sprays into both nostrils daily as needed for allergies. 16 g 11   tirzepatide (MOUNJARO) 5 MG/0.5ML Pen Inject 5 mg into the skin once a week. 2 mL 0   Current Facility-Administered Medications  Medication Dose Route Frequency Provider Last Rate Last Admin   0.9 %  sodium chloride infusion  500 mL Intravenous Once Mansouraty, Netty Starring., MD        Current Outpatient Medications:    albuterol (PROVENTIL HFA) 108 (90 Base) MCG/ACT inhaler, Inhale two puffs every 4-6 hours if needed for cough or wheeze, Disp: 18 g, Rfl: 2   atorvastatin (LIPITOR) 20 MG tablet, TAKE 1 TABLET(20 MG) BY MOUTH DAILY, Disp: 90 tablet, Rfl: 0   azelastine (ASTELIN) 0.1 % nasal spray, PLACE 2 SPRAYS INTO BOTH NOSTRILS TWICE DAILY, Disp: 30 mL, Rfl: 5   cholecalciferol (VITAMIN D3) 25 MCG (1000 UNIT) tablet, Take 1,000 Units by mouth daily., Disp: , Rfl:    Cyanocobalamin (B-12 PO), Take by mouth., Disp: , Rfl:     docusate sodium (COLACE) 100 MG capsule, Take 100 mg by mouth daily., Disp: , Rfl:    fluticasone-salmeterol (ADVAIR) 250-50 MCG/ACT AEPB, USE 1 INHALATION TWICE A DAY IN THE MORNING AND AT BEDTIME (NEED APPOINTMENT FOR FURTHER REFILLS), Disp: 180 each, Rfl: 3   hydrochlorothiazide (HYDRODIURIL) 25 MG tablet, Take 1 tablet (25 mg total) by mouth daily., Disp: 90 tablet, Rfl: 1   losartan (COZAAR) 25 MG tablet, TAKE 1 TABLET(25 MG) BY MOUTH DAILY, Disp: 90 tablet, Rfl: 1   metFORMIN (GLUCOPHAGE) 500 MG tablet, TAKE 1 TABLET(500 MG) BY MOUTH AT BEDTIME, Disp: 90 tablet, Rfl: 1   montelukast (SINGULAIR) 10 MG tablet, TAKE 1 TABLET AT BEDTIME (NEEDS AN OFFICE VISIT FOR FURTHER REFILLS), Disp: 30 tablet, Rfl: 11   Multiple Vitamin (MULTIVITAMIN) tablet, Take 1 tablet by mouth daily., Disp: , Rfl:    polyethylene glycol (MIRALAX / GLYCOLAX) packet, Take 17 g by mouth daily., Disp: , Rfl:    cetirizine (ZYRTEC) 10 MG tablet, Take 10 mg by mouth as needed for allergies. , Disp: , Rfl:    diphenhydrAMINE (BENADRYL) 25 MG tablet, Take 25 mg by mouth at bedtime as needed for allergies or sleep. , Disp: , Rfl:    EPINEPHrine (AUVI-Q) 0.3 mg/0.3 mL IJ SOAJ injection, Use as directed for severe allergic reaction, Disp: 2 each, Rfl: 2   fluticasone (FLONASE) 50 MCG/ACT nasal spray, Place 2 sprays into both nostrils daily as needed for allergies., Disp: 16 g, Rfl: 11   tirzepatide (MOUNJARO) 5 MG/0.5ML Pen, Inject 5 mg into the skin once a week., Disp: 2 mL, Rfl: 0  Current Facility-Administered Medications:    0.9 %  sodium chloride infusion, 500 mL, Intravenous, Once, Mansouraty, Netty Starring., MD Allergies  Allergen Reactions   Shellfish Allergy Shortness Of Breath and Itching   Sulfonamide Derivatives Itching, Swelling and Other (See Comments)    Throat swells   Meloxicam Swelling   Nasonex [Mometasone Furoate] Other (See Comments)    Nose bleeds   Family History  Problem Relation Age of Onset   High  Cholesterol Mother    Osteoporosis Mother    Hypertension Mother    Emphysema Father    Lung cancer Father    Hypertension Sister    Migraines Daughter    Hypertension Son        exercise   Asthma Son    COPD Other    Atopy Neg Hx    Eczema Neg Hx    Immunodeficiency Neg Hx    Urticaria Neg Hx    Colon cancer Neg Hx    Colon polyps Neg Hx    Esophageal cancer Neg Hx    Rectal cancer Neg Hx    Stomach cancer Neg Hx    Social History  Socioeconomic History   Marital status: Married    Spouse name: Not on file   Number of children: 2   Years of education: Not on file   Highest education level: 12th grade  Occupational History   Occupation: Therapist, occupational,    Employer: Network engineer  Tobacco Use   Smoking status: Never   Smokeless tobacco: Never  Vaping Use   Vaping Use: Never used  Substance and Sexual Activity   Alcohol use: No   Drug use: No   Sexual activity: Yes    Partners: Male    Birth control/protection: Surgical, Post-menopausal    Comment: Tubal Ligation  Other Topics Concern   Not on file  Social History Narrative   Work or School: Mudlogger - full time, like job      Home Situation: lives with husband      Spiritual Beliefs: Christian - baptist      Lifestyle: no regular exercise, diet "bad"   Social Determinants of Health   Financial Resource Strain: Low Risk  (06/16/2022)   Overall Financial Resource Strain (CARDIA)    Difficulty of Paying Living Expenses: Not hard at all  Food Insecurity: No Food Insecurity (06/16/2022)   Hunger Vital Sign    Worried About Running Out of Food in the Last Year: Never true    Ran Out of Food in the Last Year: Never true  Transportation Needs: No Transportation Needs (06/16/2022)   PRAPARE - Administrator, Civil Service (Medical): No    Lack of Transportation (Non-Medical): No  Physical Activity: Unknown (06/16/2022)   Exercise Vital Sign    Days of Exercise per Week: 0 days     Minutes of Exercise per Session: Not on file  Stress: No Stress Concern Present (06/16/2022)   [employer] of Occupational Health - Occupational Stress Questionnaire    Feeling of Stress : Not at all  Social Connections: Moderately Integrated (06/16/2022)   Social Connection and Isolation Panel [NHANES]    Frequency of Communication with Friends and Family: More than three times a week    Frequency of Social Gatherings with Friends and Family: Once a week    Attends Religious Services: More than 4 times per year    Active Member of [employer] or Organizations: No    Attends Engineer, structural: Not on file    Marital Status: Married  Catering manager Violence: Not on file    Physical Exam: Today's Vitals   12/19/22 0730  BP: 124/80  Pulse: 70  Temp: (!) 97.3 F (36.3 C)  TempSrc: Temporal  SpO2: 98%  Weight: 182 lb (82.6 kg)  Height: 5\' 4"  (1.626 m)   Body mass index is 31.24 kg/m. GEN: NAD EYE: Sclerae anicteric ENT: MMM CV: Non-tachycardic GI: Soft, NT/ND NEURO:  Alert & Oriented x 3  Lab Results: No results for input(s): "WBC", "HGB", "HCT", "PLT" in the last 72 hours. BMET No results for input(s): "NA", "K", "CL", "CO2", "GLUCOSE", "BUN", "CREATININE", "CALCIUM" in the last 72 hours. LFT No results for input(s): "PROT", "ALBUMIN", "AST", "ALT", "ALKPHOS", "BILITOT", "BILIDIR", "IBILI" in the last 72 hours. PT/INR No results for input(s): "LABPROT", "INR" in the last 72 hours.   Impression / Plan: This is a 62 y.o.female who presents for Colonoscopy for screening.  The risks and benefits of endoscopic evaluation/treatment were discussed with the patient and/or family; these include but are not limited to the risk of perforation, infection, bleeding, missed lesions, lack of diagnosis,  severe illness requiring hospitalization, as well as anesthesia and sedation related illnesses.  The patient's history has been reviewed, patient examined, no change in  status, and deemed stable for procedure.  The patient and/or family is agreeable to proceed.    Corliss Parish, MD [facility] Advanced [facility]oscopy Office # [phone]

## 2022-12-19 NOTE — Op Note (Signed)
[facility] Patient Name: Victoria [name] Procedure Date: 12/19/2022 8:27 AM MRN: [id number] Endoscopist: Corliss Parish , MD, [id number] Age: 62 Referring MD:  Date of Birth: 1961/06/16 Gender: Female Account #: [id number] Procedure:                Colonoscopy Indications:              Screening for colorectal malignant neoplasm Medicines:                Monitored Anesthesia Care Procedure:                Pre-Anesthesia Assessment:                           - Prior to the procedure, a History and Physical                            was performed, and patient medications and                            allergies were reviewed. The patient's tolerance of                            previous anesthesia was also reviewed. The risks                            and benefits of the procedure and the sedation                            options and risks were discussed with the patient.                            All questions were answered, and informed consent                            was obtained. Prior Anticoagulants: The patient has                            taken no anticoagulant or antiplatelet agents. ASA                            Grade Assessment: II - A patient with mild systemic                            disease. After reviewing the risks and benefits,                            the patient was deemed in satisfactory condition to                            undergo the procedure.                           After obtaining informed consent, the colonoscope  was passed under direct vision. Throughout the                            procedure, the patient's blood pressure, pulse, and                            oxygen saturations were monitored continuously. The                            Olympus CF-HQ190L ([id number]) Colonoscope was                            introduced through the anus and advanced to the the                            cecum,  identified by appendiceal orifice and                            ileocecal valve. The colonoscopy was performed                            without difficulty. The patient tolerated the                            procedure. The quality of the bowel preparation was                            adequate. The ileocecal valve, appendiceal orifice,                            and rectum were photographed. Scope In: 8:47:09 AM Scope Out: 9:01:49 AM Scope Withdrawal Time: 0 hours 11 minutes 57 seconds  Total Procedure Duration: 0 hours 14 minutes 40 seconds  Findings:                 The digital rectal exam findings include                            hemorrhoids. Pertinent negatives include no                            palpable rectal lesions.                           Multiple small-mouthed diverticula were found in                            the recto-sigmoid colon and sigmoid colon.                           Two sessile polyps were found in the rectum. The                            polyps were 2 to 3 mm in size. These polyps were  removed with a cold snare. Resection and retrieval                            were complete.                           Normal mucosa was found in the entire colon                            otherwise.                           Anal papilla(e) were hypertrophied.                           Non-bleeding non-thrombosed external and internal                            hemorrhoids were found during retroflexion, during                            perianal exam and during digital exam. The                            hemorrhoids were Grade II (internal hemorrhoids                            that prolapse but reduce spontaneously). Complications:            No immediate complications. Estimated Blood Loss:     Estimated blood loss was minimal. Impression:               - Hemorrhoids found on digital rectal exam.                           - Two 2 to 3  mm polyps in the rectum, removed with                            a cold snare. Resected and retrieved.                           - Diverticulosis in the recto-sigmoid colon and in                            the sigmoid colon.                           - Normal mucosa in the entire examined colon                            otherwise.                           - Anal papilla(e) were hypertrophied.                           - Non-bleeding non-thrombosed external  and internal                            hemorrhoids. Recommendation:           - The patient will be observed post-procedure,                            until all discharge criteria are met.                           - Discharge patient to home.                           - Patient has a contact number available for                            emergencies. The signs and symptoms of potential                            delayed complications were discussed with the                            patient. Return to normal activities tomorrow.                            Written discharge instructions were provided to the                            patient.                           - High fiber diet.                           - Use FiberCon 1-2 tablets PO daily.                           - Continue present medications.                           - Await pathology results.                           - Repeat colonoscopy in 5-10 years for surveillance                            (pending adenomatous tissue being found).                           - The findings and recommendations were discussed                            with the patient.                           - The findings and recommendations were discussed  with the patient's family. Corliss Parish, MD 12/19/2022 9:06:47 AM

## 2022-12-19 NOTE — Progress Notes (Signed)
Pt's states no medical or surgical changes since previsit or office visit. 

## 2022-12-19 NOTE — Telephone Encounter (Signed)
Spoke with patient. She [facility] prescription was canceled. I told her I wasn't sure what happened but I would go ahead and re-send the script. Told her to call back if there are any other issues. She verbalized understanding. NFN 

## 2022-12-19 NOTE — Telephone Encounter (Signed)
Spoke with patient. She states [facility] prescription was [facility]. I told her I wasn't sure what happened but I would go ahead and re-send the script. Told her to call back if there are any other issues. She verbalized understanding. NFN

## 2022-12-19 NOTE — Patient Instructions (Addendum)
High fiber diet.                           - Use FiberCon 1-2 tablets PO daily.                           - Continue present medications.                           - Await pathology results.                           - Repeat colonoscopy in 5-10 years for surveillance                            (pending adenomatous tissue being found).                           - The findings and recommendations were discussed                            with the patient.                           - The findings and recommendations were discussed                            with the patient's family.      Handout on polyps and diverticulosis given.    YOU HAD AN ENDOSCOPIC PROCEDURE TODAY AT THE [facility]:   Refer to the procedure report that was given to you for any specific questions about what was found during the examination.  If the procedure report does not answer your questions, please call your gastroenterologist to clarify.  If you requested that your care partner not be given the details of your procedure findings, then the procedure report has been included in a sealed envelope for you to review at your convenience later.  YOU SHOULD EXPECT: Some feelings of bloating in the abdomen. Passage of more gas than usual.  Walking can help get rid of the air that was put into your GI tract during the procedure and reduce the bloating. If you had a lower endoscopy (such as a colonoscopy or flexible sigmoidoscopy) you may notice spotting of blood in your stool or on the toilet paper. If you underwent a bowel prep for your procedure, you may not have a normal bowel movement for a few days.  Please Note:  You might notice some irritation and congestion in your nose or some drainage.  This is from the oxygen used during your procedure.  There is no need for concern and it should clear up in a day or so.  SYMPTOMS TO REPORT IMMEDIATELY:  Following lower endoscopy (colonoscopy or flexible  sigmoidoscopy):  Excessive amounts of blood in the stool  Significant tenderness or worsening of abdominal pains  Swelling of the abdomen that is new, acute  Fever of 100F or higher   For urgent or emergent issues, a gastroenterologist can be reached at any hour by calling [phone]. Do not use MyChart messaging for urgent concerns.    DIET:  We do  recommend a small meal at first, but then you may proceed to your regular diet.  Drink plenty of fluids but you should avoid alcoholic beverages for 24 hours.  ACTIVITY:  You should plan to take it easy for the rest of today and you should NOT DRIVE or use heavy machinery until tomorrow (because of the sedation medicines used during the test).    FOLLOW UP: Our staff will call the number listed on your records the next business day following your procedure.  We will call around 7:15- 8:00 am to check on you and address any questions or concerns that you may have regarding the information given to you following your procedure. If we do not reach you, we will leave a message.     If any biopsies were taken you will be contacted by phone or by letter within the next 1-3 weeks.  Please call us at [phone] if you have not heard about the biopsies in 3 weeks.    SIGNATURES/CONFIDENTIALITY: You and/or your care partner have signed paperwork which will be entered into your electronic medical record.  These signatures attest to the fact that that the information above on your After Visit Summary has been reviewed and is understood.  Full responsibility of the confidentiality of this discharge information lies with you and/or your care-partner.

## 2022-12-19 NOTE — Progress Notes (Signed)
Called to room to assist during endoscopic procedure.  Patient ID and intended procedure confirmed with present staff. Received instructions for my participation in the procedure from the performing physician.  

## 2022-12-19 NOTE — Progress Notes (Signed)
Report given to PACU, vss 

## 2022-12-21 ENCOUNTER — Other Ambulatory Visit: Payer: Self-pay | Admitting: Internal Medicine

## 2022-12-21 DIAGNOSIS — E1169 Type 2 diabetes mellitus with other specified complication: Secondary | ICD-10-CM

## 2022-12-22 ENCOUNTER — Telehealth: Payer: Self-pay | Admitting: Internal Medicine

## 2022-12-22 ENCOUNTER — Telehealth: Payer: Self-pay | Admitting: Pulmonary Disease

## 2022-12-22 DIAGNOSIS — I10 Essential (primary) hypertension: Secondary | ICD-10-CM

## 2022-12-22 MED ORDER — MONTELUKAST SODIUM 10 MG PO TABS: 10.0000 mg | ORAL_TABLET | Freq: Every day | ORAL | 3 refills | Status: DC

## 2022-12-22 MED ORDER — HYDROCHLOROTHIAZIDE 25 MG PO TABS
25.0000 mg | ORAL_TABLET | Freq: Every day | ORAL | 1 refills | Status: DC
Start: 2022-12-22 — End: 2023-06-22

## 2022-12-22 NOTE — Telephone Encounter (Signed)
Refill sent.

## 2022-12-22 NOTE — Telephone Encounter (Signed)
Spoke with [facility] regarding [facility] day [facility]. Advised patient i sent in her refill for Singulair sent to her pharmacy Patient's voice was understanding.Nothing else further needed.

## 2022-12-22 NOTE — Telephone Encounter (Signed)
Prescription Request  12/22/2022  LOV: 06/16/2022  What is the name of the medication or equipment? hydrochlorothiazide (HYDRODIURIL) 25 MG tablet   Have you contacted your pharmacy to request a refill? Yes   Which pharmacy would you like this sent to?  [facility] - [place], [place] - [address] Phone: [phone]  Fax: [phone]     Patient notified that their request is being sent to the clinical staff for review and that they should receive a response within 2 business days.  Need this to be a new prescription Please advise at Mobile [phone] (mobile)

## 2022-12-22 NOTE — Telephone Encounter (Signed)
Pt calling in bc her montelukast (SINGULAIR) 10 MG tablet [[id number]]  was only sent in for a 30-day supply and she needs a 90- day supply for it to be approved by her ins Pharmacy: [facility]

## 2022-12-23 ENCOUNTER — Telehealth: Payer: Self-pay

## 2022-12-23 NOTE — Telephone Encounter (Signed)
Follow up call placed, no answer. 

## 2022-12-26 ENCOUNTER — Other Ambulatory Visit: Payer: Self-pay | Admitting: Internal Medicine

## 2022-12-26 DIAGNOSIS — E1169 Type 2 diabetes mellitus with other specified complication: Secondary | ICD-10-CM

## 2022-12-31 ENCOUNTER — Other Ambulatory Visit (HOSPITAL_COMMUNITY): Payer: Self-pay

## 2022-12-31 MED ORDER — MOUNJARO 5 MG/0.5ML ~~LOC~~ SOAJ
5.0000 mg | SUBCUTANEOUS | 0 refills | Status: DC
Start: 2022-12-31 — End: 2023-01-23
  Filled 2022-12-31: qty 2, 28d supply, fill #0

## 2023-01-04 ENCOUNTER — Encounter: Payer: Self-pay | Admitting: Gastroenterology

## 2023-01-23 ENCOUNTER — Other Ambulatory Visit: Payer: Self-pay | Admitting: Internal Medicine

## 2023-01-23 ENCOUNTER — Other Ambulatory Visit (HOSPITAL_COMMUNITY): Payer: Self-pay

## 2023-01-23 DIAGNOSIS — E1169 Type 2 diabetes mellitus with other specified complication: Secondary | ICD-10-CM

## 2023-01-23 MED ORDER — MOUNJARO 5 MG/0.5ML ~~LOC~~ SOAJ
5.0000 mg | SUBCUTANEOUS | 0 refills | Status: DC
Start: 2023-01-23 — End: 2023-02-20
  Filled 2023-01-23: qty 2, 28d supply, fill #0

## 2023-02-19 ENCOUNTER — Other Ambulatory Visit: Payer: Self-pay | Admitting: Internal Medicine

## 2023-02-19 DIAGNOSIS — E1169 Type 2 diabetes mellitus with other specified complication: Secondary | ICD-10-CM

## 2023-02-20 ENCOUNTER — Other Ambulatory Visit: Payer: Self-pay | Admitting: Internal Medicine

## 2023-02-20 DIAGNOSIS — E1169 Type 2 diabetes mellitus with other specified complication: Secondary | ICD-10-CM

## 2023-02-24 ENCOUNTER — Other Ambulatory Visit (HOSPITAL_COMMUNITY): Payer: Self-pay

## 2023-02-24 MED ORDER — MOUNJARO 5 MG/0.5ML ~~LOC~~ SOAJ
5.0000 mg | SUBCUTANEOUS | 0 refills | Status: DC
  Filled 2023-02-24: qty 2, 28d supply, fill #0

## 2023-03-20 ENCOUNTER — Other Ambulatory Visit: Payer: Self-pay | Admitting: Internal Medicine

## 2023-03-20 DIAGNOSIS — E1169 Type 2 diabetes mellitus with other specified complication: Secondary | ICD-10-CM

## 2023-03-21 ENCOUNTER — Other Ambulatory Visit: Payer: Self-pay | Admitting: Internal Medicine

## 2023-03-21 DIAGNOSIS — E1129 Type 2 diabetes mellitus with other diabetic kidney complication: Secondary | ICD-10-CM

## 2023-03-25 ENCOUNTER — Other Ambulatory Visit: Payer: Self-pay

## 2023-03-27 ENCOUNTER — Other Ambulatory Visit (HOSPITAL_COMMUNITY): Payer: Self-pay

## 2023-03-27 MED ORDER — MOUNJARO 5 MG/0.5ML ~~LOC~~ SOAJ
5.0000 mg | SUBCUTANEOUS | 0 refills | Status: DC
  Filled 2023-03-27: qty 2, 28d supply, fill #0

## 2023-04-24 ENCOUNTER — Other Ambulatory Visit: Payer: Self-pay | Admitting: Internal Medicine

## 2023-04-24 DIAGNOSIS — E1169 Type 2 diabetes mellitus with other specified complication: Secondary | ICD-10-CM

## 2023-04-27 ENCOUNTER — Other Ambulatory Visit (HOSPITAL_COMMUNITY): Payer: Self-pay

## 2023-04-27 MED ORDER — MOUNJARO 5 MG/0.5ML ~~LOC~~ SOAJ
5.0000 mg | SUBCUTANEOUS | 0 refills | Status: DC
  Filled 2023-04-27: qty 2, 28d supply, fill #0

## 2023-04-30 ENCOUNTER — Other Ambulatory Visit (HOSPITAL_COMMUNITY): Payer: Self-pay

## 2023-05-18 ENCOUNTER — Other Ambulatory Visit: Payer: Self-pay | Admitting: Internal Medicine

## 2023-05-18 ENCOUNTER — Other Ambulatory Visit: Payer: Self-pay | Admitting: Adult Health

## 2023-05-18 DIAGNOSIS — E785 Hyperlipidemia, unspecified: Secondary | ICD-10-CM

## 2023-05-21 ENCOUNTER — Other Ambulatory Visit (HOSPITAL_COMMUNITY): Payer: Self-pay

## 2023-05-21 ENCOUNTER — Other Ambulatory Visit: Payer: Self-pay | Admitting: Internal Medicine

## 2023-05-21 DIAGNOSIS — E1169 Type 2 diabetes mellitus with other specified complication: Secondary | ICD-10-CM

## 2023-05-21 MED ORDER — MOUNJARO 5 MG/0.5ML ~~LOC~~ SOAJ
5.0000 mg | SUBCUTANEOUS | 0 refills | Status: DC
  Filled 2023-05-21: qty 2, 28d supply, fill #0

## 2023-06-04 ENCOUNTER — Telehealth: Payer: Self-pay | Admitting: Pulmonary Disease

## 2023-06-04 MED ORDER — AZITHROMYCIN 250 MG PO TABS: ORAL_TABLET | ORAL | 0 refills | Status: DC

## 2023-06-04 NOTE — Telephone Encounter (Signed)
Spoke with patient and she will take covid test and call me back with results.

## 2023-06-04 NOTE — Telephone Encounter (Signed)
Patient states having symptoms of headache, stuffy nose, and cough. Not able to come in for appointment. [facility] is [facility]. Patient phone number is [phone].

## 2023-06-04 NOTE — Telephone Encounter (Signed)
Cough headache and stuffy nose started Monday. Doesn't think fever. OTC medications not helping-sudafed PE, tylenol and nasal spray and saline. Cough is dry and it isnt a lot. Covid test was negative today. Asking for abx to be sent in.

## 2023-06-04 NOTE — Telephone Encounter (Signed)
Patient is calling to say that she has tested negative for Covid.

## 2023-06-04 NOTE — Telephone Encounter (Signed)
Results have been relayed to the patient/authorized caretaker. The patient/authorized caretaker verbalized understanding. No questions at this time.   

## 2023-06-13 ENCOUNTER — Emergency Department (HOSPITAL_BASED_OUTPATIENT_CLINIC_OR_DEPARTMENT_OTHER)
Admission: EM | Admit: 2023-06-13 | Discharge: 2023-06-13 | Disposition: A | Discharge status: Home / Self Care | Payer: 59 | Attending: Emergency Medicine | Admitting: Emergency Medicine

## 2023-06-13 ENCOUNTER — Encounter (HOSPITAL_BASED_OUTPATIENT_CLINIC_OR_DEPARTMENT_OTHER): Payer: Self-pay

## 2023-06-13 ENCOUNTER — Emergency Department (HOSPITAL_BASED_OUTPATIENT_CLINIC_OR_DEPARTMENT_OTHER): Payer: 59

## 2023-06-13 DIAGNOSIS — I10 Essential (primary) hypertension: Secondary | ICD-10-CM | POA: Diagnosis not present

## 2023-06-13 DIAGNOSIS — Z79899 Other long term (current) drug therapy: Secondary | ICD-10-CM | POA: Diagnosis not present

## 2023-06-13 DIAGNOSIS — Z7951 Long term (current) use of inhaled steroids: Secondary | ICD-10-CM | POA: Diagnosis not present

## 2023-06-13 DIAGNOSIS — R519 Headache, unspecified: Secondary | ICD-10-CM | POA: Insufficient documentation

## 2023-06-13 MED ORDER — METOCLOPRAMIDE HCL 5 MG/ML IJ SOLN
10.0000 mg | Freq: Once | INTRAMUSCULAR | Status: AC
  Administered 2023-06-13: 10 mg via INTRAVENOUS
  Filled 2023-06-13: qty 2

## 2023-06-13 MED ORDER — DIPHENHYDRAMINE HCL 50 MG/ML IJ SOLN
25.0000 mg | Freq: Once | INTRAMUSCULAR | Status: AC
  Administered 2023-06-13: 25 mg via INTRAVENOUS
  Filled 2023-06-13: qty 1

## 2023-06-13 MED ORDER — KETOROLAC TROMETHAMINE 15 MG/ML IJ SOLN
15.0000 mg | Freq: Once | INTRAMUSCULAR | Status: AC
  Administered 2023-06-13: 15 mg via INTRAVENOUS
  Filled 2023-06-13: qty 1

## 2023-06-13 NOTE — ED Triage Notes (Signed)
She reports right-sided h/a centered at periorbital area x 3 days. She tells me it is worse during waking hours. She further tells me that she recently finished antibiotic (azithromycin) for "sinus infection--but that got better". She denies fever/n/v/d and is in no distress.

## 2023-06-13 NOTE — ED Provider Notes (Signed)
[place] EMERGENCY DEPARTMENT AT [facility] Provider Note   CSN: [id number] Arrival date & time: 06/13/23  1251     History  Chief Complaint  Patient presents with   Headache    Victoria [name] is a 62 y.o. female.   Headache   62 year old female presents emergency department complaints of a headache.  Patient reports right-sided headache around her eyes/forehead area for the past 3 days.  Reports gradual onset with worsening since onset.  States that pain is somewhat worsened at night although never really goes away.  Denies any visual symptoms, gait abnormality, slurred speech, facial droop, weakness/sensory deficit of lower extremities.  There is report history of headache 10+ years ago.  Has tried multiple medications in the outpatient setting without significant improvement.  Presents emergency department for further assessment/evaluation.  Past medical history significant for asthma, hypercholesterolemia,  Home Medications Prior to Admission medications   Medication Sig Start Date End Date Taking? Authorizing Provider  albuterol (PROVENTIL HFA) 108 (90 Base) MCG/ACT inhaler Inhale two puffs every 4-6 hours if needed for cough or wheeze 10/27/22   Virl Diamond A, MD  atorvastatin (LIPITOR) 20 MG tablet TAKE 1 TABLET(20 MG) BY MOUTH DAILY 05/18/23   [name], [name], MD  azelastine (ASTELIN) 0.1 % nasal spray PLACE 2 SPRAYS INTO BOTH NOSTRILS TWICE DAILY 11/14/21   Oretha Milch, MD  azithromycin (ZITHROMAX) 250 MG tablet Take two today and 1 daily for 4 days 06/04/23   Oretha Milch, MD  cetirizine (ZYRTEC) 10 MG tablet Take 10 mg by mouth as needed for allergies.     [provider]  cholecalciferol (VITAMIN D3) 25 MCG (1000 UNIT) tablet Take 1,000 Units by mouth daily.    [provider]  Cyanocobalamin (B-12 PO) Take by mouth.    [provider]  diphenhydrAMINE (BENADRYL) 25 MG tablet Take 25 mg by mouth at bedtime  as needed for allergies or sleep.     [provider]  docusate sodium (COLACE) 100 MG capsule Take 100 mg by mouth daily.    [provider]  EPINEPHrine (AUVI-Q) 0.3 mg/0.3 mL IJ SOAJ injection Use as directed for severe allergic reaction 04/24/22   Parrett, Tammy S, NP  fluticasone (FLONASE) 50 MCG/ACT nasal spray SHAKE LIQUID AND USE 2 SPRAYS IN EACH NOSTRIL DAILY AS NEEDED FOR ALLERGIES 05/22/23   Olalere, Adewale A, MD  fluticasone-salmeterol (ADVAIR) 250-50 MCG/ACT AEPB USE 1 INHALATION TWICE A DAY IN THE MORNING AND AT BEDTIME (NEED APPOINTMENT FOR FURTHER REFILLS) 12/08/22   Virl Diamond A, MD  hydrochlorothiazide (HYDRODIURIL) 25 MG tablet Take 1 tablet (25 mg total) by mouth daily. 12/22/22   [name], [name], MD  losartan (COZAAR) 25 MG tablet TAKE 1 TABLET(25 MG) BY MOUTH DAILY 03/23/23   [name], [name], MD  metFORMIN (GLUCOPHAGE) 500 MG tablet TAKE 1 TABLET(500 MG) BY MOUTH AT BEDTIME 12/22/22   [name], [name], MD  montelukast (SINGULAIR) 10 MG tablet TAKE 1 TABLET AT BEDTIME (NEEDS AN OFFICE VISIT FOR FURTHER REFILLS) 12/18/22   Oretha Milch, MD  montelukast (SINGULAIR) 10 MG tablet Take 1 tablet (10 mg total) by mouth at bedtime. 90 day 12/22/22   Oretha Milch, MD  Multiple Vitamin (MULTIVITAMIN) tablet Take 1 tablet by mouth daily.    [provider]  polyethylene glycol (MIRALAX / GLYCOLAX) packet Take 17 g by mouth daily.    [provider]  tirzepatide Greggory Keen) 5 MG/0.5ML  Pen Inject 5 mg into the skin once a week. 05/21/23   [name], [name], MD      Allergies    Shellfish allergy, Sulfonamide derivatives, Meloxicam, and Nasonex [mometasone furoate]    Review of Systems   Review of Systems  Neurological:  Positive for headaches.  All other systems reviewed and are negative.   Physical Exam Updated Vital Signs BP (!) 137/92   Pulse (!) 58   Temp 97.7 F (36.5 C) (Oral)   Resp 18   LMP  03/30/2012 Comment: BTL  SpO2 98%  Physical Exam Vitals and nursing note reviewed.  Constitutional:      General: She is not in acute distress.    Appearance: She is well-developed.  HENT:     Head: Normocephalic and atraumatic.     Comments: No temporal tenderness bilaterally. Eyes:     Conjunctiva/sclera: Conjunctivae normal.  Cardiovascular:     Rate and Rhythm: Normal rate and regular rhythm.     Heart sounds: No murmur heard. Pulmonary:     Effort: Pulmonary effort is normal. No respiratory distress.     Breath sounds: Normal breath sounds.  Abdominal:     Palpations: Abdomen is soft.     Tenderness: There is no abdominal tenderness.  Musculoskeletal:        General: No swelling.     Cervical back: Neck supple.  Skin:    General: Skin is warm and dry.     Capillary Refill: Capillary refill takes less than 2 seconds.  Neurological:     Mental Status: She is alert.     Comments: Alert and oriented to self, place, time and event.   Speech is fluent, clear without dysarthria or dysphasia.   Strength 5/5 in upper/lower extremities   Sensation intact in upper/lower extremities   Normal gait.  Negative Romberg. No pronator drift.  Normal finger-to-nose and feet tapping.  CN I not tested  CN II not tested CN III, IV, VI PERRLA and EOMs intact bilaterally  CN V Intact sensation to sharp and light touch to the face  CN VII facial movements symmetric  CN VIII not tested  CN IX, X no uvula deviation, symmetric rise of soft palate  CN XI 5/5 SCM and trapezius strength bilaterally  CN XII Midline tongue protrusion, symmetric L/R movements     Psychiatric:        Mood and Affect: Mood normal.     ED Results / Procedures / Treatments   Labs (all labs ordered are listed, but only abnormal results are displayed) Labs Reviewed - No data to display  EKG None  Radiology CT Head Wo Contrast Result Date: 06/13/2023 CLINICAL DATA:  Headache, increasing frequency or  severity EXAM: CT HEAD WITHOUT CONTRAST TECHNIQUE: Contiguous axial images were obtained from the base of the skull through the vertex without intravenous contrast. RADIATION DOSE REDUCTION: This exam was performed according to the departmental dose-optimization program which includes automated exposure control, adjustment of the mA and/or kV according to patient size and/or use of iterative reconstruction technique. COMPARISON:  None Available. FINDINGS: Brain: No evidence of acute infarction, hemorrhage, hydrocephalus, extra-axial collection or mass lesion/mass effect. Vascular: No hyperdense vessel or unexpected calcification. Skull: No acute fracture. Sinuses/Orbits: Clear sinuses.  No acute orbital findings. Other: No mastoid effusions. IMPRESSION: No evidence of acute intracranial abnormality. Electronically Signed   By: Feliberto Harts M.D.   On: 06/13/2023 13:40    Procedures Procedures    Medications Ordered  in ED Medications  metoCLOPramide (REGLAN) injection 10 mg (10 mg Intravenous Given 06/13/23 1356)  ketorolac (TORADOL) 15 MG/ML injection 15 mg (15 mg Intravenous Given 06/13/23 1353)  diphenhydrAMINE (BENADRYL) injection 25 mg (25 mg Intravenous Given 06/13/23 1356)    ED Course/ Medical Decision Making/ A&P                                 Medical Decision Making Amount and/or Complexity of Data Reviewed Radiology: ordered.  Risk Prescription drug management.   This patient presents to the ED for concern of headache, this involves an extensive number of treatment options, and is a complaint that carries with it a high risk of complications and morbidity.  The differential diagnosis includes cerebral venous thrombosis, CVA, encephalitis/meningitis, migraine/tension/cluster headache, GCA, carotid artery/vertebral artery dissection, other   Co morbidities that complicate the patient evaluation  See HPI   Additional history obtained:  Additional history obtained from  EMR External records from outside source obtained and reviewed including hospital records   Lab Tests:  N/a   Imaging Studies ordered:  I ordered imaging studies including CT head I independently visualized and interpreted imaging which showed acute intracranial normality. I agree with the radiologist interpretation   Cardiac Monitoring: / EKG:  The patient was maintained on a cardiac monitor.  I personally viewed and interpreted the cardiac monitored which showed an underlying rhythm of: Sinus rhythm   Consultations Obtained:  N/a   Problem List / ED Course / Critical interventions / Medication management  Headache I ordered medication including Reglan, Toradol, Benadryl  Reevaluation of the patient after these medicines showed that the patient improved I have reviewed the patients home medicines and have made adjustments as needed   Social Determinants of Health:  Denies tobacco, licit drug use   Test / Admission - Considered:  Headache Vitals signs within normal range and stable throughout visit. Laboratory/imaging studies significant for: See above 62 year old female presents emergency department complaint of headache.  Headache present for the last 3 days.  Located right frontal/periorbital region.  On exam, nonfocal neuroexam.  Given duration of patient's headache with no history of similar headaches in 10+ years and refractory to OTC medications, CT imaging was obtained.  CT imaging negative for any acute pathology.  Patient treated with migraine cocktail and noted significant improvement/resolution of symptoms.  Suspect patient's symptoms likely secondary to cluster versus migraine headache.  Will recommend follow-up with PCP in the outpatient setting for reassessment.  Treatment plan discussed at length with patient and she acknowledged understanding was agreeable to said plan.  Patient overall well-appearing, afebrile in no acute distress. Worrisome signs and  symptoms were discussed with the patient, and the patient acknowledged understanding to return to the ED if noticed. Patient was stable upon discharge.          Final Clinical Impression(s) / ED Diagnoses Final diagnoses:  Bad headache    Rx / DC Orders ED Discharge Orders     None         Peter Garter, [place] 06/13/23 1508    Sloan Leiter, DO 06/15/23 [phone]

## 2023-06-13 NOTE — Discharge Instructions (Signed)
As discussed, CT imaging without evidence of brain bleed or other abnormality.  Suspect your symptoms are likely secondary to migraine versus tension headache.  Recommend follow-up with your primary care for reassessment.  Please do not hesitate to return if the worrisome signs and symptoms we discussed become apparent.

## 2023-06-13 NOTE — ED Notes (Signed)
Reviewed discharge instructions and home care with pt. Pt verbalized understanding and had no further questions. Pt exited ED without complications.

## 2023-06-16 ENCOUNTER — Other Ambulatory Visit: Payer: Self-pay | Admitting: Internal Medicine

## 2023-06-16 DIAGNOSIS — E1129 Type 2 diabetes mellitus with other diabetic kidney complication: Secondary | ICD-10-CM

## 2023-06-16 DIAGNOSIS — E1169 Type 2 diabetes mellitus with other specified complication: Secondary | ICD-10-CM

## 2023-06-17 ENCOUNTER — Ambulatory Visit: Payer: 59 | Admitting: Internal Medicine

## 2023-06-17 ENCOUNTER — Encounter: Payer: Self-pay | Admitting: Neurology

## 2023-06-17 ENCOUNTER — Encounter: Payer: Self-pay | Admitting: Internal Medicine

## 2023-06-17 VITALS — BP 110/84 | HR 67 | Temp 98.4°F | Ht 64.0 in | Wt 175.1 lb

## 2023-06-17 DIAGNOSIS — E785 Hyperlipidemia, unspecified: Secondary | ICD-10-CM

## 2023-06-17 DIAGNOSIS — Z0000 Encounter for general adult medical examination without abnormal findings: Secondary | ICD-10-CM | POA: Diagnosis not present

## 2023-06-17 DIAGNOSIS — I10 Essential (primary) hypertension: Secondary | ICD-10-CM

## 2023-06-17 DIAGNOSIS — R519 Headache, unspecified: Secondary | ICD-10-CM

## 2023-06-17 DIAGNOSIS — E66811 Obesity, class 1: Secondary | ICD-10-CM | POA: Diagnosis not present

## 2023-06-17 DIAGNOSIS — Z6831 Body mass index (BMI) 31.0-31.9, adult: Secondary | ICD-10-CM

## 2023-06-17 DIAGNOSIS — J4530 Mild persistent asthma, uncomplicated: Secondary | ICD-10-CM

## 2023-06-17 DIAGNOSIS — Z7985 Long-term (current) use of injectable non-insulin antidiabetic drugs: Secondary | ICD-10-CM

## 2023-06-17 DIAGNOSIS — E1169 Type 2 diabetes mellitus with other specified complication: Secondary | ICD-10-CM

## 2023-06-17 LAB — COMPREHENSIVE METABOLIC PANEL
ALT: 14 U/L (ref 0–35)
AST: 20 U/L (ref 0–37)
Albumin: 4.4 g/dL (ref 3.5–5.2)
Alkaline Phosphatase: 43 U/L (ref 39–117)
BUN: 14 mg/dL (ref 6–23)
CO2: 30 meq/L (ref 19–32)
Calcium: 10.2 mg/dL (ref 8.4–10.5)
Chloride: 101 meq/L (ref 96–112)
Creatinine, Ser: 0.75 mg/dL (ref 0.40–1.20)
GFR: 86.28 mL/min (ref >60.00)
Glucose, Bld: 85 mg/dL (ref 70–99)
Potassium: 3.2 meq/L — ABNORMAL LOW (ref 3.5–5.1)
Sodium: 139 meq/L (ref 135–145)
Total Bilirubin: 0.5 mg/dL (ref 0.2–1.2)
Total Protein: 7 g/dL (ref 6.0–8.3)

## 2023-06-17 LAB — CBC WITH DIFFERENTIAL/PLATELET
Basophils Absolute: 0 10*3/uL (ref 0.0–0.1)
Basophils Relative: 0.7 % (ref 0.0–3.0)
Eosinophils Absolute: 0.2 10*3/uL (ref 0.0–0.7)
Eosinophils Relative: 4.1 % (ref 0.0–5.0)
HCT: 37.2 % (ref 36.0–46.0)
Hemoglobin: 12.5 g/dL (ref 12.0–15.0)
Lymphocytes Relative: 44.4 % (ref 12.0–46.0)
Lymphs Abs: 2 10*3/uL (ref 0.7–4.0)
MCHC: 33.6 g/dL (ref 30.0–36.0)
MCV: 87.5 fL (ref 78.0–100.0)
Monocytes Absolute: 0.2 10*3/uL (ref 0.1–1.0)
Monocytes Relative: 4.6 % (ref 3.0–12.0)
Neutro Abs: 2.1 10*3/uL (ref 1.4–7.7)
Neutrophils Relative %: 46.2 % (ref 43.0–77.0)
Platelets: 246 10*3/uL (ref 150.0–400.0)
RBC: 4.25 Mil/uL (ref 3.87–5.11)
RDW: 15 % (ref 11.5–15.5)
WBC: 4.6 10*3/uL (ref 4.0–10.5)

## 2023-06-17 LAB — LIPID PANEL
Cholesterol: 176 mg/dL (ref 0–200)
HDL: 110.6 mg/dL (ref >39.00)
LDL Cholesterol: 52 mg/dL (ref 0–99)
NonHDL: 64.91
Total CHOL/HDL Ratio: 2
Triglycerides: 66 mg/dL (ref 0.0–149.0)
VLDL: 13.2 mg/dL (ref 0.0–40.0)

## 2023-06-17 LAB — VITAMIN B12: Vitamin B-12: 1263 pg/mL — ABNORMAL HIGH (ref 211–911)

## 2023-06-17 LAB — MICROALBUMIN / CREATININE URINE RATIO
Creatinine,U: 101.7 mg/dL
Microalb Creat Ratio: 1.2 mg/g (ref 0.0–30.0)
Microalb, Ur: 1.2 mg/dL (ref 0.0–1.9)

## 2023-06-17 LAB — HEMOGLOBIN A1C: Hgb A1c MFr Bld: 6.1 % (ref 4.6–6.5)

## 2023-06-17 LAB — TSH: TSH: 0.79 u[IU]/mL (ref 0.35–5.50)

## 2023-06-17 LAB — VITAMIN D 25 HYDROXY (VIT D DEFICIENCY, FRACTURES): VITD: 34.14 ng/mL (ref 30.00–100.00)

## 2023-06-17 MED ORDER — TIRZEPATIDE 7.5 MG/0.5ML ~~LOC~~ SOAJ: 7.5000 mg | SUBCUTANEOUS | 0 refills | Status: DC

## 2023-06-17 NOTE — Progress Notes (Signed)
Established Patient Office Visit     CC/Reason for Visit: Annual preventive exam  HPI: Victoria [name] is a 62 y.o. female who is coming in today for the above mentioned reasons. Past Medical History is significant for: Hypertension, hyperlipidemia, type 2 diabetes, asthma.  Has routine eye and dental care.  Is due for RSV vaccine, cancer screening is up-to-date.   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   Asthma    Diabetes mellitus without complication (HCC)    Fibroids, subserous    GERD (gastroesophageal reflux disease)    Headache(784.0)    tension- not an issue   Hypertension    IGT (impaired glucose tolerance) 11/04/2019   Torn meniscus 04/2020   right   Urticaria     Past Surgical History:  Procedure Laterality Date   BALLOON DILATION N/A 11/19/2015   Procedure: BALLOON DILATION;  Surgeon: Charolett Bumpers, MD;  [facility];  Service: Endoscopy;  Laterality: N/A;   BUNIONECTOMY Bilateral    COLONOSCOPY     COLONOSCOPY WITH PROPOFOL N/A 10/05/2012   Procedure: COLONOSCOPY WITH PROPOFOL;  Surgeon: Charolett Bumpers, MD;  [facility];  Service: Endoscopy;  Laterality: N/A;   ESOPHAGOGASTRODUODENOSCOPY (EGD) WITH PROPOFOL N/A 11/19/2015   Procedure: ESOPHAGOGASTRODUODENOSCOPY (EGD) WITH PROPOFOL;  Surgeon: Charolett Bumpers, MD;  [facility];  Service: Endoscopy;  Laterality: N/A;   TUBAL LIGATION  27 years ago    Social History:  reports that she has never smoked. She has never used smokeless tobacco. She reports that she does not drink alcohol and does not use drugs.  Allergies: Allergies  Allergen Reactions   Shellfish Allergy Shortness Of Breath and Itching   Sulfonamide Derivatives Itching, Swelling and Other (See Comments)    Throat swells   Meloxicam Swelling   Nasonex [Mometasone Furoate] Other  (See Comments)    Nose bleeds    Family History:  Family History  Problem Relation Age of Onset   High Cholesterol Mother    Osteoporosis Mother    Hypertension Mother    Emphysema Father    Lung cancer Father    Hypertension Sister    Migraines Daughter    Hypertension Son        exercise   Asthma Son    COPD Other    Atopy Neg Hx    Eczema Neg Hx    Immunodeficiency Neg Hx    Urticaria Neg Hx    Colon cancer Neg Hx    Colon polyps Neg Hx    Esophageal cancer Neg Hx    Rectal cancer Neg Hx    Stomach cancer Neg Hx      Current Outpatient Medications:    albuterol (PROVENTIL HFA) 108 (90 Base) MCG/ACT inhaler, Inhale two puffs every 4-6 hours if needed for cough or wheeze, Disp: 18 g, Rfl: 2   atorvastatin (LIPITOR) 20 MG tablet, TAKE 1 TABLET(20 MG) BY MOUTH DAILY, Disp: 90 tablet, Rfl: 0   azelastine (ASTELIN) 0.1 % nasal spray, PLACE 2 SPRAYS INTO BOTH NOSTRILS TWICE DAILY, Disp: 30 mL, Rfl: 5   cetirizine (ZYRTEC) 10 MG tablet, Take 10 mg by mouth as needed for allergies. , Disp: , Rfl:    cholecalciferol (VITAMIN D3) 25 MCG (1000 UNIT) tablet, Take 1,000 Units by mouth daily., Disp: , Rfl:    Cyanocobalamin (  B-12 PO), Take by mouth., Disp: , Rfl:    diphenhydrAMINE (BENADRYL) 25 MG tablet, Take 25 mg by mouth at bedtime as needed for allergies or sleep. , Disp: , Rfl:    docusate sodium (COLACE) 100 MG capsule, Take 100 mg by mouth daily., Disp: , Rfl:    EPINEPHrine (AUVI-Q) 0.3 mg/0.3 mL IJ SOAJ injection, Use as directed for severe allergic reaction, Disp: 2 each, Rfl: 2   fluticasone (FLONASE) 50 MCG/ACT nasal spray, SHAKE LIQUID AND USE 2 SPRAYS IN EACH NOSTRIL DAILY AS NEEDED FOR ALLERGIES, Disp: 16 g, Rfl: 11   fluticasone-salmeterol (ADVAIR) 250-50 MCG/ACT AEPB, USE 1 INHALATION TWICE A DAY IN THE MORNING AND AT BEDTIME (NEED APPOINTMENT FOR FURTHER REFILLS), Disp: 180 each, Rfl: 3   hydrochlorothiazide (HYDRODIURIL) 25 MG tablet, Take 1 tablet (25 mg total) by  mouth daily., Disp: 90 tablet, Rfl: 1   losartan (COZAAR) 25 MG tablet, TAKE 1 TABLET(25 MG) BY MOUTH DAILY, Disp: 90 tablet, Rfl: 0   metFORMIN (GLUCOPHAGE) 500 MG tablet, TAKE 1 TABLET(500 MG) BY MOUTH AT BEDTIME, Disp: 90 tablet, Rfl: 1   montelukast (SINGULAIR) 10 MG tablet, Take 1 tablet (10 mg total) by mouth at bedtime. 90 day, Disp: 90 tablet, Rfl: 3   Multiple Vitamin (MULTIVITAMIN) tablet, Take 1 tablet by mouth daily., Disp: , Rfl:    polyethylene glycol (MIRALAX / GLYCOLAX) packet, Take 17 g by mouth daily., Disp: , Rfl:    tirzepatide (MOUNJARO) 7.5 MG/0.5ML Pen, Inject 7.5 mg into the skin once a week., Disp: 6 mL, Rfl: 0  Review of Systems:  Negative unless indicated in HPI.   Physical Exam: Vitals:   06/17/23 1258  BP: 110/84  Pulse: 67  Temp: 98.4 F (36.9 C)  TempSrc: Oral  SpO2: 99%  Weight: 175 lb 1.6 oz (79.4 kg)  Height: 5\' 4"  (1.626 m)    Body mass index is 30.06 kg/m.   Physical Exam Vitals reviewed.  Constitutional:      General: She is not in acute distress.    Appearance: Normal appearance. She is not ill-appearing, toxic-appearing or diaphoretic.  HENT:     Head: Normocephalic.     Right Ear: Tympanic membrane, ear canal and external ear normal. There is no impacted cerumen.     Left Ear: Tympanic membrane, ear canal and external ear normal. There is no impacted cerumen.     Nose: Nose normal.     Mouth/Throat:     Mouth: Mucous membranes are moist.     Pharynx: Oropharynx is clear. No oropharyngeal exudate or posterior oropharyngeal erythema.  Eyes:     General: No scleral icterus.       Right eye: No discharge.        Left eye: No discharge.     Conjunctiva/sclera: Conjunctivae normal.     Pupils: Pupils are equal, round, and reactive to light.  Neck:     Vascular: No carotid bruit.  Cardiovascular:     Rate and Rhythm: Normal rate and regular rhythm.     Pulses: Normal pulses.     Heart sounds: Normal heart sounds.  Pulmonary:      Effort: Pulmonary effort is normal. No respiratory distress.     Breath sounds: Normal breath sounds.  Abdominal:     General: Abdomen is flat. Bowel sounds are normal.     Palpations: Abdomen is soft.  Musculoskeletal:        General: Normal range of motion.  Cervical back: Normal range of motion.  Skin:    General: Skin is warm and dry.  Neurological:     General: No focal deficit present.     Mental Status: She is alert and oriented to person, place, and time. Mental status is at baseline.  Psychiatric:        Mood and Affect: Mood normal.        Behavior: Behavior normal.        Thought Content: Thought content normal.        Judgment: Judgment normal.      Impression and Plan:  Encounter for preventive health examination  Primary hypertension  Type 2 diabetes mellitus with other specified complication, without long-term current use of insulin (HCC) -     Hemoglobin A1c; Future -     CBC with Differential/Platelet; Future -     Comprehensive metabolic panel; Future -     Lipid panel; Future -     TSH; Future -     Vitamin B12; Future -     VITAMIN D 25 Hydroxy (Vit-D Deficiency, Fractures); Future -     Microalbumin / creatinine urine ratio; Future -     Tirzepatide; Inject 7.5 mg into the skin once a week.  Dispense: 6 mL; Refill: 0  Hyperlipidemia associated with type 2 diabetes mellitus (HCC)  Mild persistent asthma without complication  Class 1 obesity without serious comorbidity with body mass index (BMI) of 31.0 to 31.9 in adult, unspecified obesity type  Nonintractable headache, unspecified chronicity pattern, unspecified headache type -     Ambulatory referral to Neurology   -Recommend routine eye and dental care. -Healthy lifestyle discussed in detail. -Labs to be updated today. -Prostate cancer screening: N/A Health Maintenance  Topic Date Due   Yearly kidney health urinalysis for diabetes  08/08/2022   Hemoglobin A1C  11/18/2022   Yearly  kidney function blood test for diabetes  02/18/2023   HIV Screening  10/22/2026*   Eye exam for diabetics  12/12/2023   Complete foot exam   06/16/2024   Mammogram  09/25/2024   DTaP/Tdap/Td vaccine (3 - Td or Tdap) 10/22/2026   Pap with HPV screening  03/06/2027   Colon Cancer Screening  12/18/2032   Flu Shot  Completed   COVID-19 Vaccine  Completed   Hepatitis C Screening  Completed   Zoster (Shingles) Vaccine  Completed   HPV Vaccine  Aged Out  *Topic was postponed. The date shown is not the original due date.     -Advised to update RSV vaccine. -Refer to neurology for her migraine headaches. -Increase Mounjaro from 5 to 7.5 mg to potentiate weight lost and A1c reduction.     Chaya Jan, MD [facility]

## 2023-06-18 ENCOUNTER — Other Ambulatory Visit: Payer: Self-pay | Admitting: Internal Medicine

## 2023-06-18 ENCOUNTER — Other Ambulatory Visit (HOSPITAL_COMMUNITY): Payer: Self-pay

## 2023-06-18 DIAGNOSIS — E1169 Type 2 diabetes mellitus with other specified complication: Secondary | ICD-10-CM

## 2023-06-18 DIAGNOSIS — E876 Hypokalemia: Secondary | ICD-10-CM

## 2023-06-18 MED ORDER — TIRZEPATIDE 7.5 MG/0.5ML ~~LOC~~ SOAJ
7.5000 mg | SUBCUTANEOUS | 0 refills | Status: DC
  Filled 2023-06-18: qty 2, 28d supply, fill #0
  Filled 2023-07-20 – 2023-07-23 (×3): qty 2, 28d supply, fill #1
  Filled 2023-08-14: qty 2, 28d supply, fill #2

## 2023-06-18 MED ORDER — POTASSIUM CHLORIDE CRYS ER 20 MEQ PO TBCR: 20.0000 meq | EXTENDED_RELEASE_TABLET | Freq: Two times a day (BID) | ORAL | 0 refills | Status: DC

## 2023-06-18 NOTE — Telephone Encounter (Signed)
Pt called to say she wanted this Rx to go to:  [facility] [phone] - [place], [place] - [address] Phone: [phone]  Fax: [phone]

## 2023-06-22 ENCOUNTER — Other Ambulatory Visit: Payer: Self-pay | Admitting: Internal Medicine

## 2023-06-22 DIAGNOSIS — I10 Essential (primary) hypertension: Secondary | ICD-10-CM

## 2023-06-29 ENCOUNTER — Other Ambulatory Visit (INDEPENDENT_AMBULATORY_CARE_PROVIDER_SITE_OTHER): Payer: 59

## 2023-06-29 DIAGNOSIS — E876 Hypokalemia: Secondary | ICD-10-CM | POA: Diagnosis not present

## 2023-06-29 LAB — BASIC METABOLIC PANEL
BUN: 14 mg/dL (ref 6–23)
CO2: 29 meq/L (ref 19–32)
Calcium: 9.6 mg/dL (ref 8.4–10.5)
Chloride: 103 meq/L (ref 96–112)
Creatinine, Ser: 0.88 mg/dL (ref 0.40–1.20)
GFR: 71.21 mL/min (ref >60.00)
Glucose, Bld: 87 mg/dL (ref 70–99)
Potassium: 3.4 meq/L — ABNORMAL LOW (ref 3.5–5.1)
Sodium: 140 meq/L (ref 135–145)

## 2023-06-30 ENCOUNTER — Other Ambulatory Visit: Payer: Self-pay | Admitting: Internal Medicine

## 2023-06-30 DIAGNOSIS — E876 Hypokalemia: Secondary | ICD-10-CM

## 2023-06-30 MED ORDER — POTASSIUM CHLORIDE CRYS ER 20 MEQ PO TBCR: 20.0000 meq | EXTENDED_RELEASE_TABLET | Freq: Two times a day (BID) | ORAL | 0 refills | Status: AC

## 2023-07-02 ENCOUNTER — Other Ambulatory Visit: Payer: 59

## 2023-07-20 ENCOUNTER — Other Ambulatory Visit (HOSPITAL_COMMUNITY): Payer: Self-pay

## 2023-07-21 ENCOUNTER — Other Ambulatory Visit (HOSPITAL_COMMUNITY): Payer: Self-pay

## 2023-07-22 ENCOUNTER — Other Ambulatory Visit (HOSPITAL_COMMUNITY): Payer: Self-pay

## 2023-07-23 ENCOUNTER — Telehealth: Payer: Self-pay | Admitting: *Deleted

## 2023-07-23 ENCOUNTER — Telehealth: Payer: Self-pay

## 2023-07-23 ENCOUNTER — Other Ambulatory Visit (HOSPITAL_COMMUNITY): Payer: Self-pay

## 2023-07-23 NOTE — Telephone Encounter (Signed)
Pharmacy Patient Advocate Encounter   Received notification from Pt Calls Messages that prior authorization for Mounjaro 7.5MG /0.5ML auto-injectors is required/requested.   Insurance verification completed.   The patient is insured through [employer] .   Per test claim: PA required and submitted KEY/EOC/Request #: [id number] APPROVED from 07/23/23 to 07/22/24. Ran test claim, Copay is $25. This test claim was processed through [facility]- copay amounts may vary at other pharmacies due to pharmacy/plan contracts, or as the patient moves through the different stages of their insurance plan.

## 2023-07-23 NOTE — Telephone Encounter (Signed)
PA request has been Approved. New Encounter created for follow up. For additional info see Pharmacy Prior Auth telephone encounter from 07/23/23.

## 2023-07-23 NOTE — Telephone Encounter (Signed)
Patient is aware 

## 2023-07-23 NOTE — Telephone Encounter (Signed)
Copied from CRM [id number]. Topic: Clinical - Prescription Issue >> Jul 23, 2023  2:21 PM Isabell A wrote: Reason for CRM:  Patient states Greggory Keen is requiring prior authorization, she is currently out of the medication & needs her dose for tomorrow.  Prior authorization dept number: [phone]

## 2023-08-05 ENCOUNTER — Other Ambulatory Visit: Payer: Self-pay | Admitting: *Deleted

## 2023-08-05 DIAGNOSIS — E1129 Type 2 diabetes mellitus with other diabetic kidney complication: Secondary | ICD-10-CM

## 2023-08-05 MED ORDER — LOSARTAN POTASSIUM 25 MG PO TABS: 25.0000 mg | ORAL_TABLET | Freq: Every day | ORAL | 1 refills | Status: DC

## 2023-08-06 ENCOUNTER — Encounter: Payer: Self-pay | Admitting: Family Medicine

## 2023-08-06 ENCOUNTER — Ambulatory Visit: Payer: 59 | Admitting: Family Medicine

## 2023-08-06 VITALS — BP 110/70 | HR 93 | Temp 99.6°F | Ht 64.0 in | Wt 172.0 lb

## 2023-08-06 DIAGNOSIS — J101 Influenza due to other identified influenza virus with other respiratory manifestations: Secondary | ICD-10-CM

## 2023-08-06 DIAGNOSIS — R6889 Other general symptoms and signs: Secondary | ICD-10-CM | POA: Diagnosis not present

## 2023-08-06 LAB — POCT INFLUENZA A/B
Influenza A, POC: POSITIVE — AB
Influenza B, POC: NEGATIVE

## 2023-08-06 LAB — POCT RAPID STREP A (OFFICE): Rapid Strep A Screen: NEGATIVE

## 2023-08-06 LAB — POC COVID19 BINAXNOW: SARS Coronavirus 2 Ag: NEGATIVE

## 2023-08-06 MED ORDER — OSELTAMIVIR PHOSPHATE 75 MG PO CAPS: 75.0000 mg | ORAL_CAPSULE | Freq: Two times a day (BID) | ORAL | 0 refills | Status: AC

## 2023-08-06 NOTE — Progress Notes (Signed)
 Acute Office Visit   Subjective:  Patient ID: Victoria [name], female    DOB: May 08, 1961, 63 y.o.   MRN: [id number]  Chief Complaint  Patient presents with   Cough   Sore Throat    HPI Patient is experiencing a productive cough with thick, yellow sputum,low grade fever, body aches, sore throat, sinus congestion, sinus headache, right ear ache with no discharge, and  mild SHOB with coughing,   Denies nausea, vomiting, or chest pain.  Symptoms started Monday, after going to the [facility].   She reports she has been taking Mucinex Cough & Chest Congestion; cough drops, Albuterol  Inhaler, and Tylenol.   ROS See HPI above      Objective:   BP 110/70   Pulse 93   Temp 99.6 F (37.6 C) (Oral)   Ht 5' 4 (1.626 m)   Wt 172 lb (78 kg)   LMP 03/30/2012 Comment: BTL  SpO2 95%   BMI 29.52 kg/m    Physical Exam Vitals reviewed.  Constitutional:      General: She is not in acute distress.    Appearance: Normal appearance. She is obese. She is ill-appearing (Mild). She is not toxic-appearing or diaphoretic.  HENT:     Head: Normocephalic and atraumatic.  Eyes:     General:        Right eye: No discharge.        Left eye: No discharge.     Conjunctiva/sclera: Conjunctivae normal.  Cardiovascular:     Rate and Rhythm: Normal rate and regular rhythm.     Heart sounds: Normal heart sounds. No murmur heard.    No friction rub. No gallop.  Pulmonary:     Effort: Pulmonary effort is normal. No respiratory distress.     Breath sounds: Normal breath sounds.  Musculoskeletal:        General: Normal range of motion.  Skin:    General: Skin is warm and dry.  Neurological:     General: No focal deficit present.     Mental Status: She is alert and oriented to person, place, and time. Mental status is at baseline.  Psychiatric:        Mood and Affect: Mood normal.        Behavior: Behavior normal.        Thought Content: Thought content normal.        Judgment:  Judgment normal.     Results for orders placed or performed in visit on 08/06/23  POC COVID-19 BinaxNow  Result Value Ref Range   SARS Coronavirus 2 Ag Negative Negative  POCT Influenza A/B  Result Value Ref Range   Influenza A, POC Positive (A) Negative   Influenza B, POC Negative Negative  POCT rapid strep A  Result Value Ref Range   Rapid Strep A Screen Negative Negative      Assessment & Plan:  Influenza A -     Oseltamivir  Phosphate; Take 1 capsule (75 mg total) by mouth 2 (two) times daily for 5 days.  Dispense: 10 capsule; Refill: 0  Flu-like symptoms -     POC COVID-19 BinaxNow -     POCT Influenza A/B -     POCT rapid strep A  -Positive for Influenza A.  -Negative for covid and strep throat.  -Provided information about influenza.  -Prescribed Tamiflu  75mg  tablet, take 1 tablet twice a day for 5 days.  -Rest, hydrate. -Take deep breaths through out the day.  -May continue to  use Mucinex, Tylenol, and Albuterol  inhaler with symptoms.  -If not improved next week, follow up.   No follow-ups on file.  Makita Blow, NP

## 2023-08-06 NOTE — Patient Instructions (Addendum)
-  Positive for Influenza A.  -Negative for covid and strep throat.  -Provided information about influenza.  -Prescribed Tamiflu  75mg  tablet, take 1 tablet twice a day for 5 days.  -Rest, hydrate. -Take deep breaths through out the day.  -May continue to use Mucinex, Tylenol, and Albuterol  inhaler with symptoms.  -If not improved next week, follow up.

## 2023-08-11 ENCOUNTER — Telehealth: Payer: Self-pay | Admitting: Internal Medicine

## 2023-08-11 DIAGNOSIS — E1169 Type 2 diabetes mellitus with other specified complication: Secondary | ICD-10-CM

## 2023-08-11 MED ORDER — METFORMIN HCL 500 MG PO TABS: 500.0000 mg | ORAL_TABLET | Freq: Every day | ORAL | 1 refills | Status: DC

## 2023-08-11 MED ORDER — ATORVASTATIN CALCIUM 20 MG PO TABS: 20.0000 mg | ORAL_TABLET | Freq: Every day | ORAL | 1 refills | Status: DC

## 2023-08-11 NOTE — Telephone Encounter (Signed)
Copied from CRM [id number]. Topic: Clinical - Medication Refill >> Aug 11, 2023  1:27 PM Turkey A wrote: Most Recent Primary Care Visit:  Provider: Zandra Abts R  Department: [facility]  Visit Type: ACUTE  Date: 08/06/2023  Medication: atorvastatin (LIPITOR) 20 MG tablet;  metFORMIN (GLUCOPHAGE) 500 MG tablet  PATIENT WOULD LIKE 90 DAY SUPPLY UP TO 3 REFILLS  Has the patient contacted their pharmacy? No (Agent: If no, request that the patient contact the pharmacy for the refill. If patient does not wish to contact the pharmacy document the reason why and proceed with request.) (Agent: If yes, when and what did the pharmacy advise?)  Is this the correct pharmacy for this prescription? No PLEASE USE [facility] [address] [place], [place] [address] [phone] Fax : [phone]  If no, delete pharmacy and type the correct one.  This is the patient's preferred pharmacy:  [facility] [phone] - [place], [place] - [address] AT [facility] [address] [place] [address] Phone: [phone] Fax: [phone]  [facility] - [place], [place] - [address] [place] [address] Phone: [phone] Fax: [phone]  [facility] [address] [place] [address] Phone: [phone] Fax: [phone]  [place] [facility] [address] [place] [address] Phone: [phone] Fax: [phone]   Has the prescription been filled recently? No  Is the patient out of the medication? Yes  Has the patient been seen for an appointment in the last year OR does the patient have an upcoming appointment? Yes  Can we respond through MyChart? Yes  Agent: Please be advised that Rx refills may take up to 3 business days. We ask that you follow-up with your pharmacy.

## 2023-08-11 NOTE — Telephone Encounter (Signed)
Refills have been sent.

## 2023-08-13 ENCOUNTER — Other Ambulatory Visit: Payer: Self-pay | Admitting: Internal Medicine

## 2023-08-13 DIAGNOSIS — Z1231 Encounter for screening mammogram for malignant neoplasm of breast: Secondary | ICD-10-CM

## 2023-08-18 ENCOUNTER — Other Ambulatory Visit: Payer: Self-pay | Admitting: Internal Medicine

## 2023-08-18 DIAGNOSIS — E1169 Type 2 diabetes mellitus with other specified complication: Secondary | ICD-10-CM

## 2023-09-11 ENCOUNTER — Other Ambulatory Visit: Payer: Self-pay | Admitting: Internal Medicine

## 2023-09-11 DIAGNOSIS — E1169 Type 2 diabetes mellitus with other specified complication: Secondary | ICD-10-CM

## 2023-09-14 ENCOUNTER — Other Ambulatory Visit (HOSPITAL_COMMUNITY): Payer: Self-pay

## 2023-09-14 MED ORDER — MOUNJARO 7.5 MG/0.5ML ~~LOC~~ SOAJ
7.5000 mg | SUBCUTANEOUS | 0 refills | Status: DC
  Filled 2023-09-14: qty 2, 28d supply, fill #0
  Filled 2023-10-09: qty 2, 28d supply, fill #1
  Filled 2023-11-05: qty 2, 28d supply, fill #2

## 2023-09-28 ENCOUNTER — Ambulatory Visit
Admission: RE | Admit: 2023-09-28 | Discharge: 2023-09-28 | Disposition: A | Discharge status: Home / Self Care | Payer: 59 | Source: Ambulatory Visit | Attending: Internal Medicine | Admitting: Internal Medicine

## 2023-09-28 DIAGNOSIS — Z1231 Encounter for screening mammogram for malignant neoplasm of breast: Secondary | ICD-10-CM

## 2023-10-07 ENCOUNTER — Encounter: Payer: Self-pay | Admitting: Internal Medicine

## 2023-10-07 ENCOUNTER — Ambulatory Visit (INDEPENDENT_AMBULATORY_CARE_PROVIDER_SITE_OTHER): Admitting: Internal Medicine

## 2023-10-07 VITALS — BP 110/74 | HR 59 | Temp 98.3°F | Wt 168.5 lb

## 2023-10-07 DIAGNOSIS — M549 Dorsalgia, unspecified: Secondary | ICD-10-CM | POA: Diagnosis not present

## 2023-10-07 MED ORDER — MELOXICAM 7.5 MG PO TABS: 7.5000 mg | ORAL_TABLET | Freq: Every day | ORAL | 0 refills | Status: AC

## 2023-10-07 NOTE — Progress Notes (Signed)
 Established Patient Office Visit     CC/Reason for Visit: Left upper back pain  HPI: Victoria [name] is a 63 y.o. female who is coming in today for the above mentioned reasons.  Had a mammogram last week, ever since then has been having left upper back pain that she believes is related to the positioning.  She has tried Epsom salt soaks and over-the-counter medications for pain without much relief.   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   Asthma    Diabetes mellitus without complication (HCC)    Fibroids, subserous    GERD (gastroesophageal reflux disease)    Headache(784.0)    tension- not an issue   Hypertension    IGT (impaired glucose tolerance) 11/04/2019   Torn meniscus 04/2020   right   Urticaria     Past Surgical History:  Procedure Laterality Date   BALLOON DILATION N/A 11/19/2015   Procedure: BALLOON DILATION;  Surgeon: Charolett Bumpers, MD;  [facility];  Service: Endoscopy;  Laterality: N/A;   BUNIONECTOMY Bilateral    COLONOSCOPY     COLONOSCOPY WITH PROPOFOL N/A 10/05/2012   Procedure: COLONOSCOPY WITH PROPOFOL;  Surgeon: Charolett Bumpers, MD;  [facility];  Service: Endoscopy;  Laterality: N/A;   ESOPHAGOGASTRODUODENOSCOPY (EGD) WITH PROPOFOL N/A 11/19/2015   Procedure: ESOPHAGOGASTRODUODENOSCOPY (EGD) WITH PROPOFOL;  Surgeon: Charolett Bumpers, MD;  [facility];  Service: Endoscopy;  Laterality: N/A;   TUBAL LIGATION  27 years ago    Social History:  reports that she has never smoked. She has never used smokeless tobacco. She reports that she does not drink alcohol and does not use drugs.  Allergies: Allergies  Allergen Reactions   Shellfish Allergy Shortness Of Breath and Itching   Sulfonamide Derivatives Itching, Swelling and Other (See Comments)    Throat swells   Nasonex [Mometasone  Furoate] Other (See Comments)    Nose bleeds    Family History:  Family History  Problem Relation Age of Onset   High Cholesterol Mother    Osteoporosis Mother    Hypertension Mother    Emphysema Father    Lung cancer Father    Hypertension Sister    Migraines Daughter    Hypertension Son        exercise   Asthma Son    COPD Other    Atopy Neg Hx    Eczema Neg Hx    Immunodeficiency Neg Hx    Urticaria Neg Hx    Colon cancer Neg Hx    Colon polyps Neg Hx    Esophageal cancer Neg Hx    Rectal cancer Neg Hx    Stomach cancer Neg Hx      Current Outpatient Medications:    albuterol (PROVENTIL HFA) 108 (90 Base) MCG/ACT inhaler, Inhale two puffs every 4-6 hours if needed for cough or wheeze, Disp: 18 g, Rfl: 2   atorvastatin (LIPITOR) 20 MG tablet, TAKE 1 TABLET(20 MG) BY MOUTH DAILY, Disp: 90 tablet, Rfl: 1   azelastine (ASTELIN) 0.1 % nasal spray, PLACE 2 SPRAYS INTO BOTH NOSTRILS TWICE DAILY, Disp: 30 mL, Rfl: 5   cetirizine (ZYRTEC) 10 MG tablet, Take 10 mg by mouth as needed for allergies. , Disp: , Rfl:    cholecalciferol (VITAMIN D3) 25 MCG (1000 UNIT) tablet, Take 1,000 Units by mouth daily.,  Disp: , Rfl:    Cyanocobalamin (B-12 PO), Take by mouth., Disp: , Rfl:    diphenhydrAMINE (BENADRYL) 25 MG tablet, Take 25 mg by mouth at bedtime as needed for allergies or sleep. , Disp: , Rfl:    docusate sodium (COLACE) 100 MG capsule, Take 100 mg by mouth daily., Disp: , Rfl:    EPINEPHrine (AUVI-Q) 0.3 mg/0.3 mL IJ SOAJ injection, Use as directed for severe allergic reaction, Disp: 2 each, Rfl: 2   fluticasone (FLONASE) 50 MCG/ACT nasal spray, SHAKE LIQUID AND USE 2 SPRAYS IN EACH NOSTRIL DAILY AS NEEDED FOR ALLERGIES, Disp: 16 g, Rfl: 11   fluticasone-salmeterol (ADVAIR) 250-50 MCG/ACT AEPB, USE 1 INHALATION TWICE A DAY IN THE MORNING AND AT BEDTIME (NEED APPOINTMENT FOR FURTHER REFILLS), Disp: 180 each, Rfl: 3   hydrochlorothiazide (HYDRODIURIL) 25 MG tablet, TAKE 1 TABLET  DAILY, Disp: 90 tablet, Rfl: 1   losartan (COZAAR) 25 MG tablet, Take 1 tablet (25 mg total) by mouth daily., Disp: 90 tablet, Rfl: 1   meloxicam (MOBIC) 7.5 MG tablet, Take 1 tablet (7.5 mg total) by mouth daily., Disp: 30 tablet, Rfl: 0   metFORMIN (GLUCOPHAGE) 500 MG tablet, Take 1 tablet (500 mg total) by mouth daily., Disp: 90 tablet, Rfl: 1   montelukast (SINGULAIR) 10 MG tablet, Take 1 tablet (10 mg total) by mouth at bedtime. 90 day, Disp: 90 tablet, Rfl: 3   Multiple Vitamin (MULTIVITAMIN) tablet, Take 1 tablet by mouth daily., Disp: , Rfl:    polyethylene glycol (MIRALAX / GLYCOLAX) packet, Take 17 g by mouth daily., Disp: , Rfl:    tirzepatide (MOUNJARO) 7.5 MG/0.5ML Pen, Inject 7.5 mg into the skin once a week., Disp: 6 mL, Rfl: 0   potassium chloride SA (KLOR-CON M) 20 MEQ tablet, Take 1 tablet (20 mEq total) by mouth 2 (two) times daily for 5 days., Disp: 10 tablet, Rfl: 0  Review of Systems:  Negative unless indicated in HPI.   Physical Exam: Vitals:   10/07/23 1543  BP: 110/74  Pulse: (!) 59  Temp: 98.3 F (36.8 C)  TempSrc: Oral  SpO2: 99%  Weight: 168 lb 8 oz (76.4 kg)    Body mass index is 28.92 kg/m.    Impression and Plan:  Upper back pain on left side -     Meloxicam; Take 1 tablet (7.5 mg total) by mouth daily.  Dispense: 30 tablet; Refill: 0  -Advised meloxicam for 10 days, massage, local ice therapy.  If no improvement can consider PT referral.   Time spent:20 minutes reviewing chart, interviewing and examining patient and formulating plan of care.     Chaya Jan, MD [facility]

## 2023-10-14 ENCOUNTER — Ambulatory Visit: Payer: 59 | Admitting: Neurology

## 2023-11-02 ENCOUNTER — Encounter: Payer: Self-pay | Admitting: Obstetrics and Gynecology

## 2023-11-02 ENCOUNTER — Ambulatory Visit (INDEPENDENT_AMBULATORY_CARE_PROVIDER_SITE_OTHER): Payer: 59 | Admitting: Obstetrics and Gynecology

## 2023-11-02 VITALS — BP 108/74 | HR 72 | Ht 65.25 in | Wt 167.0 lb

## 2023-11-02 DIAGNOSIS — Z1331 Encounter for screening for depression: Secondary | ICD-10-CM | POA: Diagnosis not present

## 2023-11-02 DIAGNOSIS — Z01419 Encounter for gynecological examination (general) (routine) without abnormal findings: Secondary | ICD-10-CM | POA: Diagnosis not present

## 2023-11-02 NOTE — Patient Instructions (Signed)

## 2023-11-02 NOTE — Progress Notes (Signed)
 63 y.o. G42P2002 Married [place] American female here for annual exam.    No vaginal bleeding.   No concerns with sexual functioning.   A1C is 6.1.    6 grandchildren.   PCP: Zilphia Hilt, Charyl Coppersmith, MD   Patient's last menstrual period was 03/30/2012.           Sexually active: Yes.    The current method of family planning is tubal ligation.    Menopausal hormone therapy:  n/a Exercising: Yes.     Walking  2 - 3 time per week.  Smoker:  no  OB History  Gravida Para Term Preterm AB Living  2 2 2   2   SAB IAB Ectopic Multiple Live Births      2    # Outcome Date GA Lbr Len/2nd Weight Sex Type Anes PTL Lv  2 Term 06/2004    F Vag-Spont     1 Term 04/1982    M Vag-Spont   LIV     HEALTH MAINTENANCE: Last 2 paps:  03/05/22 neg HR HPV neg, 06/11/20 neg HR HPV neg History of abnormal Pap or positive HPV:  no Mammogram:   09/28/23 Breast Density Cat B, BIRADS Cat 1 neg  Colonoscopy:  12/19/22 - polyps - due in 2035 Bone Density:  n/a  Result  n/a   Immunization History  Administered Date(s) Administered   Influenza Split 03/30/2013, 03/30/2014, 05/16/2015   Influenza Whole 03/30/2009   Influenza,inj,Quad PF,6+ Mos 03/09/2019, 04/24/2022   Influenza-Unspecified 02/28/2018, 05/19/2020, 04/23/2021, 03/31/2023   Moderna Sars-Covid-2 Vaccination 09/14/2019, 10/12/2019, 05/27/2020, 01/06/2021   Pfizer Covid Bivalent Pediatric Vaccine(52mos to <93yrs) 12/27/2021   Pfizer(Comirnaty)Fall Seasonal Vaccine 12 years and older 05/18/2023   Td 10/21/2016   Tdap 06/30/2009   Zoster Recombinant(Shingrix) 02/07/2020, 08/09/2020      reports that she has never smoked. She has never used smokeless tobacco. She reports that she does not drink alcohol and does not use drugs.  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   Asthma    Diabetes mellitus without complication (HCC)    Fibroids, subserous    GERD  (gastroesophageal reflux disease)    Headache(784.0)    tension- not an issue   Hypertension    IGT (impaired glucose tolerance) 11/04/2019   Torn meniscus 04/2020   right   Urticaria     Past Surgical History:  Procedure Laterality Date   BALLOON DILATION N/A 11/19/2015   Procedure: BALLOON DILATION;  Surgeon: Garrett Kallman, MD;  Location: [facility];  Service: Endoscopy;  Laterality: N/A;   BUNIONECTOMY Bilateral    COLONOSCOPY     COLONOSCOPY WITH PROPOFOL  N/A 10/05/2012   Procedure: COLONOSCOPY WITH PROPOFOL ;  Surgeon: Garrett Kallman, MD;  Location: [facility];  Service: Endoscopy;  Laterality: N/A;   ESOPHAGOGASTRODUODENOSCOPY (EGD) WITH PROPOFOL  N/A 11/19/2015   Procedure: ESOPHAGOGASTRODUODENOSCOPY (EGD) WITH PROPOFOL ;  Surgeon: Garrett Kallman, MD;  Location: [facility];  Service: Endoscopy;  Laterality: N/A;   TUBAL LIGATION  27 years ago    Current Outpatient Medications  Medication Sig Dispense Refill   albuterol  (PROVENTIL  HFA) 108 (90 Base) MCG/ACT inhaler Inhale two puffs every 4-6 hours if needed for cough or wheeze 18 g 2   atorvastatin  (LIPITOR) 20 MG tablet TAKE 1 TABLET(20 MG) BY MOUTH DAILY 90 tablet 1   azelastine  (ASTELIN ) 0.1 % nasal spray PLACE  2 SPRAYS INTO BOTH NOSTRILS TWICE DAILY 30 mL 5   cetirizine (ZYRTEC) 10 MG tablet Take 10 mg by mouth as needed for allergies.      cholecalciferol (VITAMIN D3) 25 MCG (1000 UNIT) tablet Take 1,000 Units by mouth daily.     Cyanocobalamin  (B-12 PO) Take by mouth.     diphenhydrAMINE  (BENADRYL ) 25 MG tablet Take 25 mg by mouth at bedtime as needed for allergies or sleep.      docusate sodium (COLACE) 100 MG capsule Take 100 mg by mouth daily.     EPINEPHrine  (AUVI-Q ) 0.3 mg/0.3 mL IJ SOAJ injection Use as directed for severe allergic reaction 2 each 2   fluticasone  (FLONASE ) 50 MCG/ACT nasal spray SHAKE LIQUID AND USE 2 SPRAYS IN EACH NOSTRIL DAILY AS NEEDED FOR ALLERGIES 16 g 11   fluticasone -salmeterol  (ADVAIR) 250-50 MCG/ACT AEPB USE 1 INHALATION TWICE A DAY IN THE MORNING AND AT BEDTIME (NEED APPOINTMENT FOR FURTHER REFILLS) 180 each 3   hydrochlorothiazide  (HYDRODIURIL ) 25 MG tablet TAKE 1 TABLET DAILY 90 tablet 1   losartan  (COZAAR ) 25 MG tablet Take 1 tablet (25 mg total) by mouth daily. 90 tablet 1   meloxicam  (MOBIC ) 7.5 MG tablet Take 1 tablet (7.5 mg total) by mouth daily. 30 tablet 0   metFORMIN  (GLUCOPHAGE ) 500 MG tablet Take 1 tablet (500 mg total) by mouth daily. 90 tablet 1   montelukast  (SINGULAIR ) 10 MG tablet Take 1 tablet (10 mg total) by mouth at bedtime. 90 day 90 tablet 3   Multiple Vitamin (MULTIVITAMIN) tablet Take 1 tablet by mouth daily.     polyethylene glycol (MIRALAX / GLYCOLAX) packet Take 17 g by mouth daily.     potassium chloride  SA (KLOR-CON  M) 20 MEQ tablet Take 1 tablet (20 mEq total) by mouth 2 (two) times daily for 5 days. 10 tablet 0   tirzepatide  (MOUNJARO ) 7.5 MG/0.5ML Pen Inject 7.5 mg into the skin once a week. 6 mL 0   No current facility-administered medications for this visit.    ALLERGIES: Shellfish allergy , Sulfonamide derivatives, and Nasonex  [mometasone  furoate]  Family History  Problem Relation Age of Onset   High Cholesterol Mother    Osteoporosis Mother    Hypertension Mother    Emphysema Father    Lung cancer Father    Hypertension Sister    Migraines Daughter    Hypertension Son        exercise   Asthma Son    COPD Other    Atopy Neg Hx    Eczema Neg Hx    Immunodeficiency Neg Hx    Urticaria Neg Hx    Colon cancer Neg Hx    Colon polyps Neg Hx    Esophageal cancer Neg Hx    Rectal cancer Neg Hx    Stomach cancer Neg Hx     Review of Systems  All other systems reviewed and are negative.   PHYSICAL EXAM:  BP 108/74 (BP Location: Left Arm, Patient Position: Sitting)   Pulse 72   Ht 5' 5.25" (1.657 m) Comment: with shoes  Wt 167 lb (75.8 kg) Comment: with shoes  LMP 03/30/2012 Comment: BTL  SpO2 97%   BMI 27.58  kg/m     General appearance: alert, cooperative and appears stated age Head: normocephalic, without obvious abnormality, atraumatic Neck: no adenopathy, supple, symmetrical, trachea midline and thyroid  normal to inspection and palpation Lungs: clear to auscultation bilaterally Breasts: normal appearance, no masses or tenderness, No nipple retraction or  dimpling, No nipple discharge or bleeding, No axillary adenopathy Heart: regular rate and rhythm Abdomen: soft, non-tender; no masses, no organomegaly, umbilical hernia noted.  Extremities: extremities normal, atraumatic, no cyanosis or edema Skin: skin color, texture, turgor normal. No rashes or lesions Lymph nodes: cervical, supraclavicular, and axillary nodes normal. Neurologic: grossly normal  Pelvic: External genitalia:  no lesions              No abnormal inguinal nodes palpated.              Urethra:  normal appearing urethra with no masses, tenderness or lesions              Bartholins and Skenes: normal                 Vagina: normal appearing vagina with normal color and discharge, no lesions              Cervix: no lesions              Pap taken: no Bimanual Exam:  Uterus:  normal size, contour, position, consistency, mobility, non-tender              Adnexa: no mass, fullness, tenderness              Rectal exam: yes.  Confirms.              Anus:  normal sphincter tone, no lesions  Chaperone was present for exam:   Cottie Diss, CMA  ASSESSMENT: Well woman visit with gynecologic exam. Remote hx LGSIL.  Fibroids.  Umbilical hernia.  PHQ-9: 0  PLAN: Mammogram screening discussed. Self breast awareness reviewed. Pap and HRV collected:  no, due in 2028.  Guidelines for Calcium , Vitamin D , regular exercise program including cardiovascular and weight bearing exercise. Medication refills:  NA Labs with PCP.  Signs and symptoms of incarcerated hernia reviewed.  I informed patient that a general surgeon can repair this hernia.   Follow up:  yearly and prn.

## 2023-12-04 ENCOUNTER — Other Ambulatory Visit: Payer: Self-pay | Admitting: Internal Medicine

## 2023-12-04 DIAGNOSIS — E1169 Type 2 diabetes mellitus with other specified complication: Secondary | ICD-10-CM

## 2023-12-07 ENCOUNTER — Other Ambulatory Visit (HOSPITAL_COMMUNITY): Payer: Self-pay

## 2023-12-07 MED ORDER — MOUNJARO 7.5 MG/0.5ML ~~LOC~~ SOAJ
7.5000 mg | SUBCUTANEOUS | 0 refills | Status: DC
Start: 2023-12-07 — End: 2024-02-26
  Filled 2023-12-07: qty 2, 28d supply, fill #0
  Filled 2023-12-31: qty 2, 28d supply, fill #1
  Filled 2024-01-27: qty 2, 28d supply, fill #2

## 2023-12-14 LAB — HM DIABETES EYE EXAM

## 2023-12-18 ENCOUNTER — Other Ambulatory Visit: Payer: Self-pay | Admitting: Pulmonary Disease

## 2023-12-21 ENCOUNTER — Other Ambulatory Visit: Payer: Self-pay | Admitting: Pulmonary Disease

## 2023-12-21 NOTE — Telephone Encounter (Signed)
 Copied from CRM [id number]. Topic: Clinical - Medication Refill >> Dec 21, 2023  9:39 AM Corean SAUNDERS wrote: Medication: EPINEPHrine  (AUVI-Q ) 0.3 mg/0.3 mL IJ SOAJ injection albuterol  (PROVENTIL  HFA) 108 (90 Base) MCG/ACT inhale  Has the patient contacted their pharmacy? No, prescriptions are expired.  (Agent: If no, request that the patient contact the pharmacy for the refill. If patient does not wish to contact the pharmacy document the reason why and proceed with request.) (Agent: If yes, when and what did the pharmacy advise?)  This is the patient's preferred pharmacy:  [facility] [phone] - [place], [place] - [address] AT [facility] [address] [place] [address] Phone: [phone] Fax: [phone]    Is this the correct pharmacy for this prescription? Yes If no, delete pharmacy and type the correct one.   Has the prescription been filled recently? No  Is the patient out of the medication? Yes  Has the patient been seen for an appointment in the last year OR does the patient have an upcoming appointment? Yes  Can we respond through MyChart? No  Agent: Please be advised that Rx refills may take up to 3 business days. We ask that you follow-up with your pharmacy.

## 2023-12-22 MED ORDER — EPINEPHRINE 0.3 MG/0.3ML IJ SOAJ: INTRAMUSCULAR | 2 refills | Status: AC

## 2023-12-22 MED ORDER — ALBUTEROL SULFATE HFA 108 (90 BASE) MCG/ACT IN AERS: INHALATION_SPRAY | RESPIRATORY_TRACT | 1 refills | Status: AC

## 2023-12-28 ENCOUNTER — Other Ambulatory Visit: Payer: Self-pay | Admitting: Pulmonary Disease

## 2023-12-28 ENCOUNTER — Ambulatory Visit (INDEPENDENT_AMBULATORY_CARE_PROVIDER_SITE_OTHER): Admitting: Internal Medicine

## 2023-12-28 ENCOUNTER — Encounter: Payer: Self-pay | Admitting: Internal Medicine

## 2023-12-28 VITALS — BP 110/70 | HR 76 | Temp 98.9°F | Wt 161.7 lb

## 2023-12-28 DIAGNOSIS — E1169 Type 2 diabetes mellitus with other specified complication: Secondary | ICD-10-CM | POA: Diagnosis not present

## 2023-12-28 DIAGNOSIS — N3001 Acute cystitis with hematuria: Secondary | ICD-10-CM

## 2023-12-28 DIAGNOSIS — Z7984 Long term (current) use of oral hypoglycemic drugs: Secondary | ICD-10-CM | POA: Diagnosis not present

## 2023-12-28 LAB — POC URINALSYSI DIPSTICK (AUTOMATED)
Glucose, UA: POSITIVE — AB
Ketones, UA: POSITIVE
Nitrite, UA: POSITIVE
Protein, UA: POSITIVE — AB
Spec Grav, UA: 1.02 (ref 1.010–1.025)
Urobilinogen, UA: 0.2 U/dL
pH, UA: 6.5 (ref 5.0–8.0)

## 2023-12-28 LAB — URINALYSIS, ROUTINE W REFLEX MICROSCOPIC
Ketones, ur: NEGATIVE
Nitrite: POSITIVE — AB
Specific Gravity, Urine: 1.02 (ref 1.000–1.030)
Total Protein, Urine: >=300 — AB
Urine Glucose: 100 — AB
Urobilinogen, UA: 4 — AB (ref 0.0–1.0)
pH: 6.5 (ref 5.0–8.0)

## 2023-12-28 LAB — MICROALBUMIN / CREATININE URINE RATIO
Creatinine,U: 208.5 mg/dL
Microalb Creat Ratio: 310.8 mg/g — ABNORMAL HIGH (ref 0.0–30.0)
Microalb, Ur: 64.8 mg/dL — ABNORMAL HIGH (ref 0.0–1.9)

## 2023-12-28 MED ORDER — CEPHALEXIN 500 MG PO CAPS: 500.0000 mg | ORAL_CAPSULE | Freq: Two times a day (BID) | ORAL | 0 refills | Status: AC

## 2023-12-28 NOTE — Progress Notes (Signed)
 Established Patient Office Visit     CC/Reason for Visit: Dysuria  HPI: Faylene Allerton is a 63 y.o. female who is coming in today for the above mentioned reasons.  For the past 24 hours has been experiencing dysuria, increased urinary frequency, suprapubic and low back pain.  Has been taking AZO without much relief.   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   Asthma    Diabetes mellitus without complication (HCC)    Fibroids, subserous    GERD (gastroesophageal reflux disease)    Headache(784.0)    tension- not an issue   Hypertension    IGT (impaired glucose tolerance) 11/04/2019   Torn meniscus 04/2020   right   Urticaria     Past Surgical History:  Procedure Laterality Date   BALLOON DILATION N/A 11/19/2015   Procedure: BALLOON DILATION;  Surgeon: Gladis MARLA Louder, MD;  Location: [facility];  Service: Endoscopy;  Laterality: N/A;   BUNIONECTOMY Bilateral    COLONOSCOPY     COLONOSCOPY WITH PROPOFOL  N/A 10/05/2012   Procedure: COLONOSCOPY WITH PROPOFOL ;  Surgeon: Gladis MARLA Louder, MD;  Location: [facility];  Service: Endoscopy;  Laterality: N/A;   ESOPHAGOGASTRODUODENOSCOPY (EGD) WITH PROPOFOL  N/A 11/19/2015   Procedure: ESOPHAGOGASTRODUODENOSCOPY (EGD) WITH PROPOFOL ;  Surgeon: Gladis MARLA Louder, MD;  Location: [facility];  Service: Endoscopy;  Laterality: N/A;   TUBAL LIGATION  27 years ago    Social History:  reports that she has never smoked. She has never used smokeless tobacco. She reports that she does not drink alcohol and does not use drugs.  Allergies: Allergies  Allergen Reactions   Shellfish Allergy  Shortness Of Breath and Itching   Sulfonamide Derivatives Itching, Swelling and Other (See Comments)    Throat swells   Nasonex  [Mometasone  Furoate] Other (See Comments)    Nose bleeds    Family History:  Family History   Problem Relation Age of Onset   High Cholesterol Mother    Osteoporosis Mother    Hypertension Mother    Emphysema Father    Lung cancer Father    Hypertension Sister    Migraines Daughter    Hypertension Son        exercise   Asthma Son    COPD Other    Atopy Neg Hx    Eczema Neg Hx    Immunodeficiency Neg Hx    Urticaria Neg Hx    Colon cancer Neg Hx    Colon polyps Neg Hx    Esophageal cancer Neg Hx    Rectal cancer Neg Hx    Stomach cancer Neg Hx      Current Outpatient Medications:    albuterol  (PROVENTIL  HFA) 108 (90 Base) MCG/ACT inhaler, Inhale two puffs every 4-6 hours if needed for cough or wheeze, Disp: 18 g, Rfl: 1   atorvastatin  (LIPITOR) 20 MG tablet, TAKE 1 TABLET(20 MG) BY MOUTH DAILY, Disp: 90 tablet, Rfl: 1   azelastine  (ASTELIN ) 0.1 % nasal spray, PLACE 2 SPRAYS INTO BOTH NOSTRILS TWICE DAILY, Disp: 30 mL, Rfl: 5   cephALEXin  (KEFLEX ) 500 MG capsule, Take 1 capsule (500 mg total) by mouth 2 (two) times daily for 7 days., Disp: 14 capsule, Rfl: 0   cetirizine (ZYRTEC) 10 MG tablet, Take 10 mg by mouth as needed for allergies. , Disp: , Rfl:    cholecalciferol (VITAMIN D3)  25 MCG (1000 UNIT) tablet, Take 1,000 Units by mouth daily., Disp: , Rfl:    Cyanocobalamin  (B-12 PO), Take by mouth., Disp: , Rfl:    diphenhydrAMINE  (BENADRYL ) 25 MG tablet, Take 25 mg by mouth at bedtime as needed for allergies or sleep. , Disp: , Rfl:    docusate sodium (COLACE) 100 MG capsule, Take 100 mg by mouth daily., Disp: , Rfl:    EPINEPHrine  (AUVI-Q ) 0.3 mg/0.3 mL IJ SOAJ injection, Use as directed for severe allergic reaction, Disp: 2 each, Rfl: 2   fluticasone  (FLONASE ) 50 MCG/ACT nasal spray, SHAKE LIQUID AND USE 2 SPRAYS IN EACH NOSTRIL DAILY AS NEEDED FOR ALLERGIES, Disp: 16 g, Rfl: 11   fluticasone -salmeterol (ADVAIR) 250-50 MCG/ACT AEPB, USE 1 INHALATION TWICE A DAY IN THE MORNING AND AT BEDTIME (NEED APPOINTMENT FOR FURTHER REFILLS), Disp: 180 each, Rfl: 3    hydrochlorothiazide  (HYDRODIURIL ) 25 MG tablet, TAKE 1 TABLET DAILY, Disp: 90 tablet, Rfl: 1   losartan  (COZAAR ) 25 MG tablet, Take 1 tablet (25 mg total) by mouth daily., Disp: 90 tablet, Rfl: 1   meloxicam  (MOBIC ) 7.5 MG tablet, Take 1 tablet (7.5 mg total) by mouth daily., Disp: 30 tablet, Rfl: 0   metFORMIN  (GLUCOPHAGE ) 500 MG tablet, Take 1 tablet (500 mg total) by mouth daily., Disp: 90 tablet, Rfl: 1   montelukast  (SINGULAIR ) 10 MG tablet, Take 1 tablet (10 mg total) by mouth at bedtime. 90 day, Disp: 90 tablet, Rfl: 3   Multiple Vitamin (MULTIVITAMIN) tablet, Take 1 tablet by mouth daily., Disp: , Rfl:    polyethylene glycol (MIRALAX / GLYCOLAX) packet, Take 17 g by mouth daily., Disp: , Rfl:    potassium chloride  SA (KLOR-CON  M) 20 MEQ tablet, Take 1 tablet (20 mEq total) by mouth 2 (two) times daily for 5 days., Disp: 10 tablet, Rfl: 0   tirzepatide  (MOUNJARO ) 7.5 MG/0.5ML Pen, Inject 7.5 mg into the skin once a week., Disp: 6 mL, Rfl: 0  Review of Systems:  Negative unless indicated in HPI.   Physical Exam: Vitals:   12/28/23 0856  BP: 110/70  Pulse: 76  Temp: 98.9 F (37.2 C)  TempSrc: Oral  SpO2: 96%  Weight: 161 lb 11.2 oz (73.3 kg)    Body mass index is 26.7 kg/m.   Impression and Plan:  Acute cystitis with hematuria -     POCT Urinalysis Dipstick (Automated) -     Urine Culture; Future -     Urinalysis; Future -     Cephalexin ; Take 1 capsule (500 mg total) by mouth 2 (two) times daily for 7 days.  Dispense: 14 capsule; Refill: 0  Type 2 diabetes mellitus with other specified complication, without long-term current use of insulin (HCC) -     Microalbumin / creatinine urine ratio   - Urine dipstick is indicative of a UTI with positive leukocytes, nitrites, blood.  Start Keflex , sent for urine culture.  Time spent:22 minutes reviewing chart, interviewing and examining patient and formulating plan of care.     Tully Theophilus Andrews, MD  Primary  Care at [facility]

## 2023-12-31 ENCOUNTER — Ambulatory Visit: Payer: Self-pay | Admitting: Internal Medicine

## 2023-12-31 LAB — URINE CULTURE
MICRO NUMBER:: 16641560
SPECIMEN QUALITY:: ADEQUATE

## 2024-01-04 ENCOUNTER — Other Ambulatory Visit: Payer: Self-pay | Admitting: Pulmonary Disease

## 2024-01-11 ENCOUNTER — Telehealth: Payer: Self-pay | Admitting: Pulmonary Disease

## 2024-01-11 ENCOUNTER — Other Ambulatory Visit (HOSPITAL_BASED_OUTPATIENT_CLINIC_OR_DEPARTMENT_OTHER): Payer: Self-pay

## 2024-01-11 MED ORDER — MONTELUKAST SODIUM 10 MG PO TABS: 10.0000 mg | ORAL_TABLET | Freq: Every day | ORAL | 3 refills | Status: AC

## 2024-01-11 NOTE — Telephone Encounter (Unsigned)
 Copied from CRM [id number]. Topic: Clinical - Medication Refill >> Jan 11, 2024 12:42 PM Isabell A wrote: Medication: montelukast  (SINGULAIR ) 10 MG tablet  Has the patient contacted their pharmacy? No (Agent: If no, request that the patient contact the pharmacy for the refill. If patient does not wish to contact the pharmacy document the reason why and proceed with request.) (Agent: If yes, when and what did the pharmacy advise?)  This is the patient's preferred pharmacy:  [facility] - [place], [place] - [address] [place] [address] Phone: [phone] Fax: [phone]   Is this the correct pharmacy for this prescription? Yes If no, delete pharmacy and type the correct one.   Has the prescription been filled recently? Yes  Is the patient out of the medication? No  Has the patient been seen for an appointment in the last year OR does the patient have an upcoming appointment? Yes  Can we respond through MyChart? No  Agent: Please be advised that Rx refills may take up to 3 business days. We ask that you follow-up with your pharmacy.

## 2024-01-11 NOTE — Telephone Encounter (Signed)
 Rx sent.

## 2024-01-25 ENCOUNTER — Other Ambulatory Visit: Payer: Self-pay | Admitting: Internal Medicine

## 2024-01-25 DIAGNOSIS — I10 Essential (primary) hypertension: Secondary | ICD-10-CM

## 2024-01-28 ENCOUNTER — Other Ambulatory Visit: Payer: Self-pay | Admitting: Internal Medicine

## 2024-01-28 DIAGNOSIS — E785 Hyperlipidemia, unspecified: Secondary | ICD-10-CM

## 2024-02-03 ENCOUNTER — Ambulatory Visit (INDEPENDENT_AMBULATORY_CARE_PROVIDER_SITE_OTHER): Admitting: Primary Care

## 2024-02-03 ENCOUNTER — Encounter: Payer: Self-pay | Admitting: Primary Care

## 2024-02-03 VITALS — BP 102/64 | HR 79 | Ht 65.0 in | Wt 163.0 lb

## 2024-02-03 DIAGNOSIS — J4530 Mild persistent asthma, uncomplicated: Secondary | ICD-10-CM

## 2024-02-03 DIAGNOSIS — G4733 Obstructive sleep apnea (adult) (pediatric): Secondary | ICD-10-CM | POA: Diagnosis not present

## 2024-02-03 NOTE — Progress Notes (Signed)
 @Patient  ID: Victoria [name], female    DOB: 1961-06-17, 62 y.o.   MRN: [id number]  No chief complaint on file.   Referring provider: Theophilus Andrews, [place]*  HPI: 63 year old female, never smoked. PMH significant HTN for asthma.   Previous [facility] pulmonary encounter:  Patient with mild persistent asthma On Advair which she uses regularly  Has not needed a rescue inhaler  Symptoms have been well-controlled  Has mild obstructive sleep apnea with an AHI of 6.2 As focused on weight loss efforts and has managed to lose weight and continues to do so  Exercises 2-3 times a week Has not been evaluated for asthma exacerbation recently but has kept on with follow-ups  She does have multiple allergies and remains on Singulair , Flonase , Astelin , Zyrtec  Has been functioning relatively well  Was seeing Dr. Jude    02/03/2024- Interim  Discussed the use of AI scribe software for clinical note transcription with the patient, who gave verbal consent to proceed.  History of Present Illness Victoria [name] is a 63 year old female with asthma who presents for a routine follow-up and medication refill.  She has mild persistent asthma and is currently on Advair 250-50mcg, which she takes twice daily, one puff in the morning and one at bedtime, along with Singulair . She has not experienced any recent asthma flare-ups, respiratory infections, or hospitalizations related to her asthma. Over the past four weeks, she has not experienced any shortness of breath, wheezing, or coughing, and has not needed to use her rescue inhaler. She describes her asthma as 'completely controlled'.  She has a history of mild obstructive sleep apnea, diagnosed through a home sleep test showing 6.2 apneic events per hour. She is not on CPAP therapy. No issues with her sleep, such as waking up gasping or choking, and she denies any alcohol use before bed.   Allergies  Allergen Reactions   Shellfish Allergy   Shortness Of Breath and Itching   Sulfonamide Derivatives Itching, Swelling and Other (See Comments)    Throat swells   Nasonex  [Mometasone  Furoate] Other (See Comments)    Nose bleeds    Immunization History  Administered Date(s) Administered   Influenza Split 03/30/2013, 03/30/2014, 05/16/2015   Influenza Whole 03/30/2009   Influenza,inj,Quad PF,6+ Mos 03/09/2019, 04/24/2022   Influenza-Unspecified 02/28/2018, 05/19/2020, 04/23/2021, 03/31/2023   Moderna Sars-Covid-2 Vaccination 09/14/2019, 10/12/2019, 05/27/2020, 01/06/2021   Pfizer Covid Bivalent Pediatric Vaccine(61mos to <39yrs) 12/27/2021   Pfizer(Comirnaty)Fall Seasonal Vaccine 12 years and older 05/18/2023   Td 10/21/2016   Tdap 06/30/2009   Zoster Recombinant(Shingrix) 02/07/2020, 08/09/2020    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   Asthma    Diabetes mellitus without complication (HCC)    Fibroids, subserous    GERD (gastroesophageal reflux disease)    Headache(784.0)    tension- not an issue   Hypertension    IGT (impaired glucose tolerance) 11/04/2019   Torn meniscus 04/2020   right   Urticaria     Tobacco History: Social History   Tobacco Use  Smoking Status Never  Smokeless Tobacco Never   Counseling given: Not Answered   Outpatient Medications Prior to Visit  Medication Sig Dispense Refill   albuterol  (PROVENTIL  HFA) 108 (90 Base) MCG/ACT inhaler Inhale two puffs every 4-6 hours if needed for cough or wheeze 18 g 1   atorvastatin  (LIPITOR) 20 MG tablet  TAKE 1 TABLET DAILY 90 tablet 1   azelastine  (ASTELIN ) 0.1 % nasal spray PLACE 2 SPRAYS INTO BOTH NOSTRILS TWICE DAILY 30 mL 5   cetirizine (ZYRTEC) 10 MG tablet Take 10 mg by mouth as needed for allergies.      cholecalciferol (VITAMIN D3) 25 MCG (1000 UNIT) tablet Take 1,000 Units by mouth daily.     Cyanocobalamin  (B-12 PO) Take by mouth.     diphenhydrAMINE   (BENADRYL ) 25 MG tablet Take 25 mg by mouth at bedtime as needed for allergies or sleep.      docusate sodium (COLACE) 100 MG capsule Take 100 mg by mouth daily.     EPINEPHrine  (AUVI-Q ) 0.3 mg/0.3 mL IJ SOAJ injection Use as directed for severe allergic reaction 2 each 2   fluticasone  (FLONASE ) 50 MCG/ACT nasal spray SHAKE LIQUID AND USE 2 SPRAYS IN EACH NOSTRIL DAILY AS NEEDED FOR ALLERGIES 16 g 11   fluticasone -salmeterol (ADVAIR) 250-50 MCG/ACT AEPB USE 1 INHALATION TWICE A DAY IN THE MORNING AND AT BEDTIME (NEED APPOINTMENT FOR FURTHER REFILLS) 180 each 2   hydrochlorothiazide  (HYDRODIURIL ) 25 MG tablet TAKE 1 TABLET DAILY 90 tablet 1   losartan  (COZAAR ) 25 MG tablet Take 1 tablet (25 mg total) by mouth daily. 90 tablet 1   meloxicam  (MOBIC ) 7.5 MG tablet Take 1 tablet (7.5 mg total) by mouth daily. 30 tablet 0   metFORMIN  (GLUCOPHAGE ) 500 MG tablet Take 1 tablet (500 mg total) by mouth daily. 90 tablet 1   montelukast  (SINGULAIR ) 10 MG tablet Take 1 tablet (10 mg total) by mouth at bedtime. 90 day 90 tablet 3   Multiple Vitamin (MULTIVITAMIN) tablet Take 1 tablet by mouth daily.     polyethylene glycol (MIRALAX / GLYCOLAX) packet Take 17 g by mouth daily.     potassium chloride  SA (KLOR-CON  M) 20 MEQ tablet Take 1 tablet (20 mEq total) by mouth 2 (two) times daily for 5 days. 10 tablet 0   tirzepatide  (MOUNJARO ) 7.5 MG/0.5ML Pen Inject 7.5 mg into the skin once a week. 6 mL 0   No facility-administered medications prior to visit.      Review of Systems  Review of Systems  Constitutional: Negative.   HENT: Negative.    Respiratory: Negative.    Cardiovascular: Negative.    Physical Exam  LMP 03/30/2012 Comment: BTL Physical Exam Constitutional:      Appearance: Normal appearance.  HENT:     Head: Normocephalic and atraumatic.     Mouth/Throat:     Mouth: Mucous membranes are moist.     Pharynx: Oropharynx is clear.  Cardiovascular:     Rate and Rhythm: Normal rate and  regular rhythm.  Pulmonary:     Effort: Pulmonary effort is normal.     Breath sounds: Normal breath sounds.  Musculoskeletal:        General: Normal range of motion.  Skin:    General: Skin is warm and dry.  Neurological:     General: No focal deficit present.     Mental Status: She is alert and oriented to person, place, and time. Mental status is at baseline.  Psychiatric:        Mood and Affect: Mood normal.        Behavior: Behavior normal.        Thought Content: Thought content normal.        Judgment: Judgment normal.      Lab Results:  CBC    Component Value Date/Time  WBC 4.6 06/17/2023 1317   RBC 4.25 06/17/2023 1317   HGB 12.5 06/17/2023 1317   HGB 12.8 04/26/2018 1527   HCT 37.2 06/17/2023 1317   HCT 39.0 04/26/2018 1527   PLT 246.0 06/17/2023 1317   PLT 271 04/26/2018 1527   MCV 87.5 06/17/2023 1317   MCV 85 04/26/2018 1527   MCH 27.8 04/26/2018 1527   MCH 28.3 09/20/2015 2045   MCHC 33.6 06/17/2023 1317   RDW 15.0 06/17/2023 1317   RDW 14.4 04/26/2018 1527   LYMPHSABS 2.0 06/17/2023 1317   MONOABS 0.2 06/17/2023 1317   EOSABS 0.2 06/17/2023 1317   BASOSABS 0.0 06/17/2023 1317    BMET    Component Value Date/Time   NA 140 06/29/2023 0947   NA 142 04/26/2018 1527   K 3.4 (L) 06/29/2023 0947   CL 103 06/29/2023 0947   CO2 29 06/29/2023 0947   GLUCOSE 87 06/29/2023 0947   BUN 14 06/29/2023 0947   BUN 8 04/26/2018 1527   CREATININE 0.88 06/29/2023 0947   CALCIUM  9.6 06/29/2023 0947   GFRNONAA 100 04/26/2018 1527   GFRAA 115 04/26/2018 1527    BNP No results found for: BNP  ProBNP No results found for: PROBNP  Imaging: No results found.   Assessment & Plan:    1. Mild persistent asthma without complication (Primary)  2. OSA (obstructive sleep apnea)   Assessment and Plan Assessment & Plan Mild persistent asthma Mild persistent asthma, well-controlled with current medication regimen. No recent flare-ups, respiratory  infections, or hospitalizations. No recent use of rescue inhaler. Symptoms are completely controlled with Advair and Singulair . - Continue Advair 250-50mcg as prescribed, one puff in the morning and at bedtime. - Continue Singulair  as prescribed. - Use albuterol  for breakthrough symptoms such as shortness of breath, chest tightness, or wheezing. - Contact provider if symptoms do not improve with albuterol  or if experiencing chest tightness not relieved by albuterol . - Seek medical attention if coughing up colored mucus, experiencing fever, or if albuterol  is needed several times a day. - Follow-up in 1 year or sooner if needed, refills as needed   Mild obstructive sleep apnea Mild obstructive sleep apnea with 6.2 apneic events per hour. Conservative management is appropriate given the mild severity and minimal risks associated. - Focus on weight loss to maintain a normal BMI. - Optimize sleep position by avoiding sleeping on the back and consider side sleeping or elevating the head. - Avoid alcohol consumption before bed. - Avoid driving if experiencing poor sleep.    Almarie LELON Ferrari, NP 02/03/2024

## 2024-02-03 NOTE — Patient Instructions (Signed)
  VISIT SUMMARY: Today, you came in for a routine follow-up and medication refill for your asthma. You reported that your asthma has been completely controlled with no recent flare-ups or need for your rescue inhaler. We also reviewed your mild obstructive sleep apnea and discussed some lifestyle adjustments to help manage it.  YOUR PLAN: -MILD PERSISTENT ASTHMA: Mild persistent asthma is a type of asthma where symptoms occur more than twice a week but not daily. Your asthma is well-controlled with your current medications, Advair and Singulair . Continue taking Advair as prescribed, one puff in the morning and one at bedtime, and Singulair  as prescribed. Use albuterol  for any breakthrough symptoms like shortness of breath, chest tightness, or wheezing. Contact us  if your symptoms do not improve with albuterol  or if you experience chest tightness not relieved by albuterol . Seek medical attention if you start coughing up colored mucus, have a fever, or need to use albuterol  several times a day.  -MILD OBSTRUCTIVE SLEEP APNEA: Mild obstructive sleep apnea is a condition where your breathing stops and starts during sleep, occurring 6.2 times per hour in your case. Since it is mild, we recommend conservative management. Focus on weight loss to maintain a normal BMI, avoid sleeping on your back by trying side sleeping or elevating your head, and avoid alcohol before bed. Do not drive if you experience poor sleep.  INSTRUCTIONS: Please continue with your current medications for asthma as prescribed. Use albuterol  for any breakthrough symptoms and contact us  if your symptoms do not improve. For your sleep apnea, focus on weight loss, avoid sleeping on your back, and avoid alcohol before bed. Follow up with us  if you have any concerns or if your symptoms worsen.   Follow-up 1 year with Dr. Neda or landry NP

## 2024-02-26 ENCOUNTER — Other Ambulatory Visit (HOSPITAL_COMMUNITY): Payer: Self-pay

## 2024-02-26 ENCOUNTER — Other Ambulatory Visit: Payer: Self-pay | Admitting: Internal Medicine

## 2024-02-26 DIAGNOSIS — E1169 Type 2 diabetes mellitus with other specified complication: Secondary | ICD-10-CM

## 2024-02-26 MED ORDER — MOUNJARO 7.5 MG/0.5ML ~~LOC~~ SOAJ
7.5000 mg | SUBCUTANEOUS | 0 refills | Status: DC
  Filled 2024-02-26: qty 2, 28d supply, fill #0
  Filled 2024-03-25: qty 2, 28d supply, fill #1
  Filled 2024-04-20: qty 2, 28d supply, fill #2

## 2024-03-07 ENCOUNTER — Other Ambulatory Visit: Payer: Self-pay | Admitting: Internal Medicine

## 2024-03-07 DIAGNOSIS — E1169 Type 2 diabetes mellitus with other specified complication: Secondary | ICD-10-CM

## 2024-03-07 DIAGNOSIS — E1129 Type 2 diabetes mellitus with other diabetic kidney complication: Secondary | ICD-10-CM

## 2024-03-15 ENCOUNTER — Ambulatory Visit (INDEPENDENT_AMBULATORY_CARE_PROVIDER_SITE_OTHER): Admitting: Internal Medicine

## 2024-03-15 ENCOUNTER — Telehealth: Payer: Self-pay | Admitting: *Deleted

## 2024-03-15 ENCOUNTER — Encounter: Payer: Self-pay | Admitting: Internal Medicine

## 2024-03-15 VITALS — BP 110/70 | HR 76 | Temp 98.4°F | Wt 157.5 lb

## 2024-03-15 DIAGNOSIS — R300 Dysuria: Secondary | ICD-10-CM

## 2024-03-15 DIAGNOSIS — N3001 Acute cystitis with hematuria: Secondary | ICD-10-CM

## 2024-03-15 LAB — URINALYSIS, ROUTINE W REFLEX MICROSCOPIC
Bilirubin Urine: NEGATIVE
Ketones, ur: NEGATIVE
Nitrite: POSITIVE — AB
Specific Gravity, Urine: 1.01 (ref 1.000–1.030)
Urine Glucose: 100 — AB
Urobilinogen, UA: 1 (ref 0.0–1.0)
pH: 5.5 (ref 5.0–8.0)

## 2024-03-15 LAB — POC URINALSYSI DIPSTICK (AUTOMATED)
Glucose, UA: NEGATIVE
Ketones, UA: NEGATIVE
Nitrite, UA: POSITIVE
Protein, UA: POSITIVE — AB
Spec Grav, UA: 1.015 (ref 1.010–1.025)
Urobilinogen, UA: 1 U/dL
pH, UA: 5.5 (ref 5.0–8.0)

## 2024-03-15 MED ORDER — CIPROFLOXACIN HCL 500 MG PO TABS: 500.0000 mg | ORAL_TABLET | Freq: Two times a day (BID) | ORAL | 0 refills | Status: AC

## 2024-03-15 NOTE — Progress Notes (Signed)
 Established Patient Office Visit     CC/Reason for Visit: Dysuria and increased urinary frequency  HPI: Victoria [name] is a 63 y.o. female who is coming in today for the above mentioned reasons.  For the past 24 hours has been experiencing dysuria and increased urinary frequency as well as left-sided flank pain.  Reminiscent of a previous UTI.   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   Asthma    Diabetes mellitus without complication (HCC)    Fibroids, subserous    GERD (gastroesophageal reflux disease)    Headache(784.0)    tension- not an issue   Hypertension    IGT (impaired glucose tolerance) 11/04/2019   Torn meniscus 04/2020   right   Urticaria     Past Surgical History:  Procedure Laterality Date   BALLOON DILATION N/A 11/19/2015   Procedure: BALLOON DILATION;  Surgeon: Gladis MARLA Louder, MD;  Location: [facility];  Service: Endoscopy;  Laterality: N/A;   BUNIONECTOMY Bilateral    COLONOSCOPY     COLONOSCOPY WITH PROPOFOL  N/A 10/05/2012   Procedure: COLONOSCOPY WITH PROPOFOL ;  Surgeon: Gladis MARLA Louder, MD;  Location: [facility];  Service: Endoscopy;  Laterality: N/A;   ESOPHAGOGASTRODUODENOSCOPY (EGD) WITH PROPOFOL  N/A 11/19/2015   Procedure: ESOPHAGOGASTRODUODENOSCOPY (EGD) WITH PROPOFOL ;  Surgeon: Gladis MARLA Louder, MD;  Location: [facility];  Service: Endoscopy;  Laterality: N/A;   TUBAL LIGATION  27 years ago    Social History:  reports that she has never smoked. She has never used smokeless tobacco. She reports that she does not drink alcohol and does not use drugs.  Allergies: Allergies  Allergen Reactions   Shellfish Allergy  Shortness Of Breath and Itching   Sulfonamide Derivatives Itching, Swelling and Other (See Comments)    Throat swells   Nasonex  [Mometasone  Furoate] Other (See Comments)    Nose bleeds    Family  History:  Family History  Problem Relation Age of Onset   High Cholesterol Mother    Osteoporosis Mother    Hypertension Mother    Emphysema Father    Lung cancer Father    Hypertension Sister    Migraines Daughter    Hypertension Son        exercise   Asthma Son    COPD Other    Atopy Neg Hx    Eczema Neg Hx    Immunodeficiency Neg Hx    Urticaria Neg Hx    Colon cancer Neg Hx    Colon polyps Neg Hx    Esophageal cancer Neg Hx    Rectal cancer Neg Hx    Stomach cancer Neg Hx      Current Outpatient Medications:    albuterol  (PROVENTIL  HFA) 108 (90 Base) MCG/ACT inhaler, Inhale two puffs every 4-6 hours if needed for cough or wheeze, Disp: 18 g, Rfl: 1   atorvastatin  (LIPITOR) 20 MG tablet, TAKE 1 TABLET DAILY, Disp: 90 tablet, Rfl: 1   azelastine  (ASTELIN ) 0.1 % nasal spray, PLACE 2 SPRAYS INTO BOTH NOSTRILS TWICE DAILY, Disp: 30 mL, Rfl: 5   cetirizine (ZYRTEC) 10 MG tablet, Take 10 mg by mouth as needed for allergies. , Disp: , Rfl:    cholecalciferol (VITAMIN D3) 25 MCG (1000 UNIT) tablet, Take 1,000 Units by mouth daily., Disp: , Rfl:    ciprofloxacin  (CIPRO ) 500 MG tablet, Take 1 tablet (500  mg total) by mouth 2 (two) times daily for 7 days., Disp: 14 tablet, Rfl: 0   Cyanocobalamin  (B-12 PO), Take by mouth., Disp: , Rfl:    diphenhydrAMINE  (BENADRYL ) 25 MG tablet, Take 25 mg by mouth at bedtime as needed for allergies or sleep. , Disp: , Rfl:    docusate sodium (COLACE) 100 MG capsule, Take 100 mg by mouth daily., Disp: , Rfl:    EPINEPHrine  (AUVI-Q ) 0.3 mg/0.3 mL IJ SOAJ injection, Use as directed for severe allergic reaction, Disp: 2 each, Rfl: 2   fluticasone  (FLONASE ) 50 MCG/ACT nasal spray, SHAKE LIQUID AND USE 2 SPRAYS IN EACH NOSTRIL DAILY AS NEEDED FOR ALLERGIES, Disp: 16 g, Rfl: 11   fluticasone -salmeterol (ADVAIR) 250-50 MCG/ACT AEPB, USE 1 INHALATION TWICE A DAY IN THE MORNING AND AT BEDTIME (NEED APPOINTMENT FOR FURTHER REFILLS), Disp: 180 each, Rfl: 2    hydrochlorothiazide  (HYDRODIURIL ) 25 MG tablet, TAKE 1 TABLET DAILY, Disp: 90 tablet, Rfl: 1   losartan  (COZAAR ) 25 MG tablet, TAKE 1 TABLET DAILY, Disp: 90 tablet, Rfl: 0   meloxicam  (MOBIC ) 7.5 MG tablet, Take 1 tablet (7.5 mg total) by mouth daily., Disp: 30 tablet, Rfl: 0   metFORMIN  (GLUCOPHAGE ) 500 MG tablet, TAKE 1 TABLET DAILY, Disp: 90 tablet, Rfl: 0   montelukast  (SINGULAIR ) 10 MG tablet, Take 1 tablet (10 mg total) by mouth at bedtime. 90 day, Disp: 90 tablet, Rfl: 3   Multiple Vitamin (MULTIVITAMIN) tablet, Take 1 tablet by mouth daily., Disp: , Rfl:    polyethylene glycol (MIRALAX / GLYCOLAX) packet, Take 17 g by mouth daily., Disp: , Rfl:    potassium chloride  SA (KLOR-CON  M) 20 MEQ tablet, Take 1 tablet (20 mEq total) by mouth 2 (two) times daily for 5 days., Disp: 10 tablet, Rfl: 0   tirzepatide  (MOUNJARO ) 7.5 MG/0.5ML Pen, Inject 7.5 mg into the skin once a week., Disp: 6 mL, Rfl: 0  Review of Systems:  Negative unless indicated in HPI.   Physical Exam: Vitals:   03/15/24 1437  BP: 110/70  Pulse: 76  Temp: 98.4 F (36.9 C)  TempSrc: Oral  SpO2: 99%  Weight: 157 lb 8 oz (71.4 kg)    Body mass index is 26.21 kg/m.    Impression and Plan:  Acute cystitis with hematuria -     Ciprofloxacin  HCl; Take 1 tablet (500 mg total) by mouth 2 (two) times daily for 7 days.  Dispense: 14 tablet; Refill: 0  Dysuria -     POCT Urinalysis Dipstick (Automated) -     Urine Culture; Future -     Urinalysis; Future   - In office urine dipstick indicative of a UTI with positive blood, nitrates and leukocytes.  Sent for culture, since allergic to Bactrim will treat with Cipro  for 7 days.  Time spent:22 minutes reviewing chart, interviewing and examining patient and formulating plan of care.     Tully Theophilus Andrews, MD [facility]

## 2024-03-15 NOTE — Telephone Encounter (Signed)
 Copied from CRM #[id number]. Topic: General - Other >> Mar 15, 2024  8:17 AM Aleatha C wrote: Reason for CRM: Patient believes to have a bladder infection and would like a call from Dr Delma Nurse

## 2024-03-15 NOTE — Telephone Encounter (Signed)
 Appointment scheduled.

## 2024-03-16 LAB — URINE CULTURE
MICRO NUMBER:: 16974236
SPECIMEN QUALITY:: ADEQUATE

## 2024-05-04 ENCOUNTER — Ambulatory Visit (INDEPENDENT_AMBULATORY_CARE_PROVIDER_SITE_OTHER): Admitting: Podiatry

## 2024-05-04 ENCOUNTER — Encounter: Payer: Self-pay | Admitting: Podiatry

## 2024-05-04 DIAGNOSIS — E1169 Type 2 diabetes mellitus with other specified complication: Secondary | ICD-10-CM

## 2024-05-04 NOTE — Progress Notes (Signed)
  Subjective:  Patient ID: Victoria [name], female    DOB: 1960/11/15,   MRN: [id number]  Chief Complaint  Patient presents with   Diabetes    I'm here for a Diabetic Foot check.  I had a sore on the side of my left big toe.  Saw Dr. Theophilus GLENWOOD Andrews - 03/15/2024; A1c - 6.1    63 y.o. female presents for concern of diabetic foot check.  She also has concern of left great toe that has off-and-on been sore.  She relates sometimes some redness in the area.  She relates she tries to trim back the cuticle and that tends to help.  No concerns right now.  Denies tingling burning and numbness of the feet.  She is diabetic and last A1c was 6.1. Denies any other pedal complaints. Denies n/v/f/c.   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 CFT <3 seconds. Normal hair growth on digits. No edema.  Skin. No lacerations or abrasions bilateral feet.  No incurvation noted to great toe on the right.  No tenderness today. Musculoskeletal: MMT 5/5 bilateral lower extremities in DF, PF, Inversion and Eversion. Deceased ROM in DF of ankle joint.  No pain to palpation about the foot. Neurological: Sensation intact to light touch.   Assessment:   1. Type 2 diabetes mellitus with other specified complication, without long-term current use of insulin (HCC)      Plan:  Patient was evaluated and treated and all questions answered. -Discussed and educated patient on diabetic foot care, especially with  regards to the vascular, neurological and musculoskeletal systems.   -Stressed the importance of good glycemic control and the detriment of not  controlling glucose levels in relation to the foot. -Discussed supportive shoes at all times and checking feet regularly.  -Answered all patient questions -Patient to return  in 1 year for diabetic foot check -Patient advised to call the office if any problems or questions arise in the meantime.   Asberry Failing, DPM

## 2024-05-23 ENCOUNTER — Other Ambulatory Visit: Payer: Self-pay | Admitting: Internal Medicine

## 2024-05-23 ENCOUNTER — Other Ambulatory Visit (HOSPITAL_COMMUNITY): Payer: Self-pay

## 2024-05-23 DIAGNOSIS — E1169 Type 2 diabetes mellitus with other specified complication: Secondary | ICD-10-CM

## 2024-05-23 MED ORDER — MOUNJARO 7.5 MG/0.5ML ~~LOC~~ SOAJ
7.5000 mg | SUBCUTANEOUS | 0 refills | Status: AC
  Filled 2024-05-23: qty 2, 28d supply, fill #0
  Filled 2024-06-16: qty 2, 28d supply, fill #1

## 2024-06-06 ENCOUNTER — Other Ambulatory Visit: Payer: Self-pay | Admitting: Internal Medicine

## 2024-06-06 DIAGNOSIS — E1129 Type 2 diabetes mellitus with other diabetic kidney complication: Secondary | ICD-10-CM

## 2024-06-06 DIAGNOSIS — E1169 Type 2 diabetes mellitus with other specified complication: Secondary | ICD-10-CM

## 2024-11-08 ENCOUNTER — Ambulatory Visit: Admitting: Obstetrics and Gynecology
